# Patient Record
Sex: Male | Born: 1950 | Race: White | Hispanic: No | Marital: Married | Smoking: Former smoker
Health system: Southern US, Community
[De-identification: ages and names within clinical notes are randomized; demographics above are authoritative.]

## PROBLEM LIST (undated history)

## (undated) DIAGNOSIS — I2510 Atherosclerotic heart disease of native coronary artery without angina pectoris: Secondary | ICD-10-CM

## (undated) DIAGNOSIS — M1990 Unspecified osteoarthritis, unspecified site: Secondary | ICD-10-CM

## (undated) DIAGNOSIS — I10 Essential (primary) hypertension: Secondary | ICD-10-CM

## (undated) DIAGNOSIS — D649 Anemia, unspecified: Secondary | ICD-10-CM

## (undated) DIAGNOSIS — C4491 Basal cell carcinoma of skin, unspecified: Secondary | ICD-10-CM

## (undated) DIAGNOSIS — E785 Hyperlipidemia, unspecified: Secondary | ICD-10-CM

## (undated) DIAGNOSIS — Z9889 Other specified postprocedural states: Secondary | ICD-10-CM

## (undated) DIAGNOSIS — I4891 Unspecified atrial fibrillation: Secondary | ICD-10-CM

## (undated) DIAGNOSIS — M109 Gout, unspecified: Secondary | ICD-10-CM

## (undated) DIAGNOSIS — N189 Chronic kidney disease, unspecified: Secondary | ICD-10-CM

## (undated) DIAGNOSIS — I499 Cardiac arrhythmia, unspecified: Secondary | ICD-10-CM

## (undated) DIAGNOSIS — Z952 Presence of prosthetic heart valve: Secondary | ICD-10-CM

## (undated) DIAGNOSIS — D696 Thrombocytopenia, unspecified: Secondary | ICD-10-CM

## (undated) DIAGNOSIS — Z95810 Presence of automatic (implantable) cardiac defibrillator: Secondary | ICD-10-CM

## (undated) DIAGNOSIS — I509 Heart failure, unspecified: Secondary | ICD-10-CM

## (undated) HISTORY — DX: Heart failure, unspecified: I50.9

## (undated) HISTORY — DX: Unspecified atrial fibrillation: I48.91

## (undated) HISTORY — DX: Presence of prosthetic heart valve: Z95.2

## (undated) HISTORY — DX: Presence of automatic (implantable) cardiac defibrillator: Z95.810

## (undated) HISTORY — DX: Cardiac arrhythmia, unspecified: I49.9

## (undated) HISTORY — DX: Anemia, unspecified: D64.9

## (undated) HISTORY — DX: Atherosclerotic heart disease of native coronary artery without angina pectoris: I25.10

## (undated) HISTORY — DX: Other specified postprocedural states: Z98.890

## (undated) HISTORY — DX: Gout, unspecified: M10.9

## (undated) HISTORY — PX: SHOULDER SURGERY: SHX246

## (undated) HISTORY — DX: Essential (primary) hypertension: I10

## (undated) HISTORY — DX: Hyperlipidemia, unspecified: E78.5

## (undated) HISTORY — DX: Unspecified osteoarthritis, unspecified site: M19.90

## (undated) HISTORY — PX: KNEE SURGERY: SHX244

## (undated) HISTORY — DX: Basal cell carcinoma of skin, unspecified: C44.91

## (undated) HISTORY — DX: Chronic kidney disease, unspecified: N18.9

## (undated) HISTORY — PX: JOINT REPLACEMENT: SHX530

## (undated) HISTORY — DX: Thrombocytopenia, unspecified: D69.6

---

## 1996-06-14 DIAGNOSIS — I10 Essential (primary) hypertension: Secondary | ICD-10-CM

## 1996-06-14 HISTORY — DX: Essential (primary) hypertension: I10

## 1999-06-15 DIAGNOSIS — I499 Cardiac arrhythmia, unspecified: Secondary | ICD-10-CM

## 1999-06-15 HISTORY — DX: Cardiac arrhythmia, unspecified: I49.9

## 1999-06-21 DIAGNOSIS — I2510 Atherosclerotic heart disease of native coronary artery without angina pectoris: Secondary | ICD-10-CM

## 1999-06-21 DIAGNOSIS — I509 Heart failure, unspecified: Secondary | ICD-10-CM

## 1999-06-21 HISTORY — DX: Heart failure, unspecified: I50.9

## 1999-06-21 HISTORY — DX: Atherosclerotic heart disease of native coronary artery without angina pectoris: I25.10

## 2008-06-14 DIAGNOSIS — N189 Chronic kidney disease, unspecified: Secondary | ICD-10-CM

## 2008-06-14 HISTORY — DX: Chronic kidney disease, unspecified: N18.9

## 2009-06-14 DIAGNOSIS — C4491 Basal cell carcinoma of skin, unspecified: Secondary | ICD-10-CM

## 2009-06-14 HISTORY — DX: Basal cell carcinoma of skin, unspecified: C44.91

## 2013-11-09 DIAGNOSIS — Z95810 Presence of automatic (implantable) cardiac defibrillator: Secondary | ICD-10-CM

## 2013-11-09 HISTORY — DX: Presence of automatic (implantable) cardiac defibrillator: Z95.810

## 2015-06-15 DIAGNOSIS — D696 Thrombocytopenia, unspecified: Secondary | ICD-10-CM

## 2015-06-15 HISTORY — DX: Thrombocytopenia, unspecified: D69.6

## 2015-12-13 DIAGNOSIS — Z952 Presence of prosthetic heart valve: Secondary | ICD-10-CM

## 2015-12-13 DIAGNOSIS — Z9889 Other specified postprocedural states: Secondary | ICD-10-CM

## 2015-12-13 HISTORY — DX: Other specified postprocedural states: Z98.890

## 2015-12-13 HISTORY — DX: Presence of prosthetic heart valve: Z95.2

## 2015-12-25 HISTORY — PX: CORONARY ARTERY BYPASS GRAFT: SHX141

## 2015-12-28 DIAGNOSIS — I4891 Unspecified atrial fibrillation: Secondary | ICD-10-CM | POA: Diagnosis present

## 2015-12-28 HISTORY — DX: Unspecified atrial fibrillation: I48.91

## 2016-10-26 ENCOUNTER — Ambulatory Visit (INDEPENDENT_AMBULATORY_CARE_PROVIDER_SITE_OTHER): Payer: Medicare Other | Admitting: Family Medicine

## 2016-10-26 ENCOUNTER — Encounter (INDEPENDENT_AMBULATORY_CARE_PROVIDER_SITE_OTHER): Payer: Self-pay

## 2016-10-26 ENCOUNTER — Ambulatory Visit (HOSPITAL_COMMUNITY)
Admission: RE | Admit: 2016-10-26 | Discharge: 2016-10-26 | Disposition: A | Discharge status: Home / Self Care | Payer: Medicare Other | Source: Ambulatory Visit | Attending: Family Medicine | Admitting: Family Medicine

## 2016-10-26 ENCOUNTER — Encounter: Payer: Self-pay | Admitting: Family Medicine

## 2016-10-26 VITALS — BP 92/60 | HR 96 | Temp 97.7°F | Resp 20 | Ht 66.0 in | Wt 204.0 lb

## 2016-10-26 DIAGNOSIS — I5042 Chronic combined systolic (congestive) and diastolic (congestive) heart failure: Secondary | ICD-10-CM | POA: Diagnosis present

## 2016-10-26 DIAGNOSIS — Z96653 Presence of artificial knee joint, bilateral: Secondary | ICD-10-CM | POA: Diagnosis not present

## 2016-10-26 DIAGNOSIS — I10 Essential (primary) hypertension: Secondary | ICD-10-CM | POA: Diagnosis not present

## 2016-10-26 DIAGNOSIS — I700 Atherosclerosis of aorta: Secondary | ICD-10-CM | POA: Diagnosis not present

## 2016-10-26 DIAGNOSIS — N184 Chronic kidney disease, stage 4 (severe): Secondary | ICD-10-CM

## 2016-10-26 DIAGNOSIS — Z951 Presence of aortocoronary bypass graft: Secondary | ICD-10-CM | POA: Diagnosis not present

## 2016-10-26 DIAGNOSIS — M1990 Unspecified osteoarthritis, unspecified site: Secondary | ICD-10-CM | POA: Insufficient documentation

## 2016-10-26 DIAGNOSIS — Z1159 Encounter for screening for other viral diseases: Secondary | ICD-10-CM | POA: Diagnosis not present

## 2016-10-26 DIAGNOSIS — M19041 Primary osteoarthritis, right hand: Secondary | ICD-10-CM

## 2016-10-26 DIAGNOSIS — M19042 Primary osteoarthritis, left hand: Secondary | ICD-10-CM

## 2016-10-26 DIAGNOSIS — I2510 Atherosclerotic heart disease of native coronary artery without angina pectoris: Secondary | ICD-10-CM

## 2016-10-26 DIAGNOSIS — Z952 Presence of prosthetic heart valve: Secondary | ICD-10-CM

## 2016-10-26 DIAGNOSIS — E785 Hyperlipidemia, unspecified: Secondary | ICD-10-CM | POA: Diagnosis not present

## 2016-10-26 DIAGNOSIS — C44612 Basal cell carcinoma of skin of right upper limb, including shoulder: Secondary | ICD-10-CM

## 2016-10-26 NOTE — Progress Notes (Signed)
lmtcb

## 2016-10-26 NOTE — Progress Notes (Signed)
Chief Complaint  Patient presents with  . Establish Care   New to establish He has multiple significant medical problems, and has spent the last few years living traveling and living in a motor home with no PCP.  He did establish with an MD in [place] while there last year when he had a MI and CABG urgently with aortic valve replacement.  That doctor has filled his medicine until now.  He weighs himself daily, takes all medicine prescribed, follows low salt diet, adjust diuretics accordingly managing his CHF.  Says his EF is around 20%.  Lately has had more SOB, fatigue, difficulty sleeping flat, and pressure in chest.  He needs cardiology assessment urgently and is advised - referral placed. BP controlled Lipids managed, no recent labs He says he is in stage 4 kidney failure He brings in his medicine for review  Had a flu shot and pneumonia shot in the [place] last year Insurance will not pay for shingles shot because he had shingles Colonoscopy over 10 years   Patient Active Problem List   Diagnosis Date Noted  . History of total bilateral knee replacement (TKR) 10/26/2016  . 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 CAD (coronary artery disease) 10/26/2016  . HTN (hypertension) 10/26/2016  . Chronic kidney failure 10/26/2016  . HLD (hyperlipidemia) 10/26/2016  . Chronic combined systolic and diastolic CHF (congestive heart failure) ([place]) 10/26/2016  . Osteoarthritis 10/26/2016    Outpatient Encounter Prescriptions as of 10/26/2016  Medication Sig  . allopurinol (ZYLOPRIM) 300 MG tablet Take 300 mg by mouth daily.  Marland Kitchen aspirin EC 81 MG tablet Take 81 mg by mouth daily.  Marland Kitchen atorvastatin (LIPITOR) 40 MG tablet Take 40 mg by mouth daily at 6 PM.  . bumetanide (BUMEX) 1 MG tablet Take 1 mg by mouth 2 (two) times daily.  . carvedilol (COREG) 12.5 MG tablet Take 12.5 mg by mouth 2 (two) times daily with a meal.  . cholecalciferol (VITAMIN D) 1000 units tablet Take 2,000 Units by mouth daily.  .  ferrous sulfate (IRON SUPPLEMENT) 325 (65 FE) MG tablet Take 325 mg by mouth daily with breakfast.  . Multiple Vitamin (MULTIVITAMIN) capsule Take 1 capsule by mouth daily.  . nitroGLYCERIN (NITROSTAT) 0.4 MG SL tablet Place 0.4 mg under the tongue every 5 (five) minutes as needed for chest pain.  . Probiotic Product (SOLUBLE FIBER/PROBIOTICS PO) Take by mouth.  . sacubitril-valsartan (ENTRESTO) 24-26 MG Take 1 tablet by mouth 2 (two) times daily.  Marland Kitchen spironolactone (ALDACTONE) 25 MG tablet Take 25 mg by mouth daily.  . vitamin C (ASCORBIC ACID) 500 MG tablet Take 500 mg by mouth daily.   No facility-administered encounter medications on file as of 10/26/2016.     Past Medical History:  Diagnosis Date  . Allergy    penicillin  . Arthritis    arthritis  . Blood transfusion without reported diagnosis    open heart  . Cancer ([place])    skin cancer  . CHF (congestive heart failure) ([place])   . Chronic kidney disease    stage 4 kidney failure  . Heart murmur   . Hyperlipidemia   . Hypertension   . Myocardial infarction [facility] [place])     Past Surgical History:  Procedure Laterality Date  . CORONARY ARTERY BYPASS GRAFT  12/2015  . JOINT REPLACEMENT     both knees replaced  . KNEE SURGERY     bilateral  . SHOULDER SURGERY     left  Social History   Social History  . Marital status: Married    Spouse name: [name]  . Number of children: 0  . Years of education: 14   Occupational History  . 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 Smoking status: Former Smoker    Packs/day: 0.50    Types: Cigarettes    Start date: 06/14/1970    Quit date: 06/14/2000  . Smokeless tobacco: Never Used  . Alcohol use Yes     Comment: occas social drink  . Drug use: No  . Sexual activity: Yes    Birth control/ protection: Surgical   Other Topics Concern  . 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 Stroke Mother   . Heart disease Father 68  . Multiple myeloma Brother     Review of Systems  Constitutional: Positive for malaise/fatigue. Negative for chills, fever and weight loss.  HENT: Negative for congestion and hearing loss.   Eyes: Negative for blurred vision and pain.  Respiratory: Positive for cough, sputum production and shortness of breath.   Cardiovascular: Positive for chest pain, orthopnea, leg swelling and PND.  Gastrointestinal: Negative for abdominal pain, constipation, diarrhea and heartburn.  Genitourinary: Negative for dysuria and frequency.  Musculoskeletal: Negative for falls, joint pain and myalgias.  Neurological: Negative for dizziness, seizures and headaches.  Psychiatric/Behavioral: Negative for depression. The patient is not nervous/anxious and does not have insomnia.     BP 92/60 (BP Location: Right Arm, Patient Position: Sitting, Cuff Size: Normal)   Pulse 96   Temp 97.7 F (36.5 C) (Temporal)   Resp 20   Ht _0  (1.676 m)   Wt 204 lb (92.5 kg)   SpO2 95%   BMI 32.93 kg/m   Physical Exam  Constitutional: He is oriented to person, place, and time. He appears well-developed and well-nourished.  HENT:  Head: Normocephalic and atraumatic.  Right Ear: External ear normal.  Left Ear: External ear normal.  Mouth/Throat: Oropharynx is clear and moist.  Eyes: Conjunctivae are normal. Pupils are equal, round, and reactive to light.  Neck: Normal range of motion. Neck supple. No thyromegaly present.  Cardiovascular: Normal rate and regular rhythm.   Systolic murmur  Pulmonary/Chest: Effort normal. No respiratory distress.  Rales bases  Abdominal: Soft. Bowel sounds are normal.  Musculoskeletal: Normal range of motion. He exhibits no edema.  Lymphadenopathy:    He has no cervical adenopathy.  Neurological: He is alert and oriented to  person, place, and time.  Gait normal  Skin: Skin is warm and dry.  Lesion on R forearm  Psychiatric: He has a normal mood and affect. His behavior is normal. Thought content normal.  Nursing note and vitals reviewed.   1. History of total bilateral knee replacement (TKR)   2. Hx of CABG  - CBC - COMPLETE METABOLIC PANEL WITH GFR - Lipid panel - Urinalysis, Routine w reflex microscopic - Ambulatory referral to Cardiology  3. Coronary artery disease involving native coronary artery of native heart without angina pectoris  - CBC - COMPLETE METABOLIC PANEL WITH GFR - Lipid panel - Urinalysis, Routine w reflex microscopic - Ambulatory referral to Cardiology  4. Essential hypertension  5. Chronic renal failure, stage 4 (severe) ([place])   6. Hyperlipidemia, unspecified hyperlipidemia type   7. Chronic combined systolic and diastolic CHF (congestive heart failure) ([place])  - DG Chest 2 View; Future  8. Primary osteoarthritis of both hands   9. Need for hepatitis C screening test  - Hepatitis C antibody  10. BCC (basal cell carcinoma), arm, right  - Ambulatory referral to Dermatology Greater than 50% of this visit was spent in counseling and coordinating care.  Total face to face time:  60 min managing CHF worsening and new patient info   Patient Instructions  Need labs today Need chest x ray today Need to see a cardiologist, referral placed (prefer next week) Need to see dermatology - any time  I will let you know your results Make sure we have your number Need old records from [place] See me in a month       [name], MD

## 2016-10-26 NOTE — Patient Instructions (Signed)
Need labs today Need chest x ray today Need to see a cardiologist, referral placed (prefer next week) Need to see dermatology - any time  I will let you know your results Make sure we have your number Need old records from [place] See me in a month

## 2016-10-27 ENCOUNTER — Encounter: Payer: Self-pay | Admitting: Family Medicine

## 2016-10-27 LAB — COMPLETE METABOLIC PANEL WITH GFR
ALT: 25 U/L (ref 9–46)
AST: 20 U/L (ref 10–35)
Albumin: 3.8 g/dL (ref 3.6–5.1)
Alkaline Phosphatase: 71 U/L (ref 40–115)
BILIRUBIN TOTAL: 0.6 mg/dL (ref 0.2–1.2)
BUN: 56 mg/dL — ABNORMAL HIGH (ref 7–25)
CHLORIDE: 103 mmol/L (ref 98–110)
CO2: 25 mmol/L (ref 20–31)
Calcium: 9.3 mg/dL (ref 8.6–10.3)
Creat: 2.38 mg/dL — ABNORMAL HIGH (ref 0.70–1.25)
GFR, EST AFRICAN AMERICAN: 31 mL/min — AB (ref >=60)
GFR, EST NON AFRICAN AMERICAN: 27 mL/min — AB (ref >=60)
Glucose, Bld: 88 mg/dL (ref 65–99)
Potassium: 4.7 mmol/L (ref 3.5–5.3)
Sodium: 139 mmol/L (ref 135–146)
Total Protein: 6.6 g/dL (ref 6.1–8.1)

## 2016-10-27 LAB — LIPID PANEL
Cholesterol: 106 mg/dL (ref <200)
HDL: 44 mg/dL (ref >40)
LDL CALC: 40 mg/dL (ref <100)
Total CHOL/HDL Ratio: 2.4 Ratio (ref <5.0)
Triglycerides: 110 mg/dL (ref <150)
VLDL: 22 mg/dL (ref <30)

## 2016-10-27 LAB — URINALYSIS, ROUTINE W REFLEX MICROSCOPIC
Bilirubin Urine: NEGATIVE
GLUCOSE, UA: NEGATIVE
Hgb urine dipstick: NEGATIVE
Ketones, ur: NEGATIVE
Leukocytes, UA: NEGATIVE
Nitrite: NEGATIVE
PROTEIN: NEGATIVE
Specific Gravity, Urine: 1.014 (ref 1.001–1.035)
pH: 5.5 (ref 5.0–8.0)

## 2016-10-27 LAB — CBC
HCT: 36.3 % — ABNORMAL LOW (ref 38.5–50.0)
Hemoglobin: 11.7 g/dL — ABNORMAL LOW (ref 13.2–17.1)
MCH: 32.9 pg (ref 27.0–33.0)
MCHC: 32.2 g/dL (ref 32.0–36.0)
MCV: 102 fL — AB (ref 80.0–100.0)
MPV: 11.6 fL (ref 7.5–12.5)
PLATELETS: 158 10*3/uL (ref 140–400)
RBC: 3.56 MIL/uL — ABNORMAL LOW (ref 4.20–5.80)
RDW: 15.7 % — ABNORMAL HIGH (ref 11.0–15.0)
WBC: 7.7 10*3/uL (ref 3.8–10.8)

## 2016-10-27 LAB — HEPATITIS C ANTIBODY: HCV Ab: NEGATIVE

## 2016-10-28 NOTE — Progress Notes (Signed)
[facility], aware.

## 2016-11-16 ENCOUNTER — Telehealth: Payer: Self-pay | Admitting: Family Medicine

## 2016-11-16 MED ORDER — ALLOPURINOL 300 MG PO TABS: 300.0000 mg | ORAL_TABLET | Freq: Every day | ORAL | 3 refills | Status: AC

## 2016-11-16 MED ORDER — ATORVASTATIN CALCIUM 40 MG PO TABS: 40.0000 mg | ORAL_TABLET | Freq: Every day | ORAL | 3 refills | Status: AC

## 2016-11-16 NOTE — Telephone Encounter (Signed)
Patient is having trouble getting his [facility]. I could not make out what they were per the message he left.  He has about 3. Cb#: [phone]

## 2016-11-16 NOTE — Telephone Encounter (Signed)
rxs done, [facility] per pt.

## 2016-11-18 ENCOUNTER — Encounter: Payer: Self-pay | Admitting: Family Medicine

## 2016-11-18 DIAGNOSIS — E559 Vitamin D deficiency, unspecified: Secondary | ICD-10-CM | POA: Insufficient documentation

## 2016-11-18 DIAGNOSIS — Z95810 Presence of automatic (implantable) cardiac defibrillator: Secondary | ICD-10-CM | POA: Insufficient documentation

## 2016-11-19 ENCOUNTER — Emergency Department (HOSPITAL_COMMUNITY): Payer: Medicare Other

## 2016-11-19 ENCOUNTER — Encounter (HOSPITAL_COMMUNITY): Payer: Self-pay | Admitting: Emergency Medicine

## 2016-11-19 ENCOUNTER — Inpatient Hospital Stay (HOSPITAL_COMMUNITY)
Admission: EM | Admit: 2016-11-19 | Discharge: 2016-11-21 | DRG: 291 | Disposition: A | Discharge status: Home / Self Care | Payer: Medicare Other | Attending: Internal Medicine | Admitting: Internal Medicine

## 2016-11-19 DIAGNOSIS — I5023 Acute on chronic systolic (congestive) heart failure: Secondary | ICD-10-CM | POA: Diagnosis present

## 2016-11-19 DIAGNOSIS — Z952 Presence of prosthetic heart valve: Secondary | ICD-10-CM

## 2016-11-19 DIAGNOSIS — I130 Hypertensive heart and chronic kidney disease with heart failure and stage 1 through stage 4 chronic kidney disease, or unspecified chronic kidney disease: Secondary | ICD-10-CM | POA: Diagnosis not present

## 2016-11-19 DIAGNOSIS — Z807 Family history of other malignant neoplasms of lymphoid, hematopoietic and related tissues: Secondary | ICD-10-CM

## 2016-11-19 DIAGNOSIS — I509 Heart failure, unspecified: Secondary | ICD-10-CM

## 2016-11-19 DIAGNOSIS — I420 Dilated cardiomyopathy: Secondary | ICD-10-CM | POA: Diagnosis present

## 2016-11-19 DIAGNOSIS — I959 Hypotension, unspecified: Secondary | ICD-10-CM | POA: Diagnosis present

## 2016-11-19 DIAGNOSIS — R42 Dizziness and giddiness: Secondary | ICD-10-CM | POA: Diagnosis not present

## 2016-11-19 DIAGNOSIS — Z8249 Family history of ischemic heart disease and other diseases of the circulatory system: Secondary | ICD-10-CM

## 2016-11-19 DIAGNOSIS — I493 Ventricular premature depolarization: Secondary | ICD-10-CM | POA: Diagnosis present

## 2016-11-19 DIAGNOSIS — I9589 Other hypotension: Secondary | ICD-10-CM

## 2016-11-19 DIAGNOSIS — E785 Hyperlipidemia, unspecified: Secondary | ICD-10-CM | POA: Diagnosis present

## 2016-11-19 DIAGNOSIS — I252 Old myocardial infarction: Secondary | ICD-10-CM

## 2016-11-19 DIAGNOSIS — Z951 Presence of aortocoronary bypass graft: Secondary | ICD-10-CM

## 2016-11-19 DIAGNOSIS — N184 Chronic kidney disease, stage 4 (severe): Secondary | ICD-10-CM | POA: Diagnosis present

## 2016-11-19 DIAGNOSIS — I5021 Acute systolic (congestive) heart failure: Secondary | ICD-10-CM | POA: Diagnosis not present

## 2016-11-19 DIAGNOSIS — Z85828 Personal history of other malignant neoplasm of skin: Secondary | ICD-10-CM

## 2016-11-19 DIAGNOSIS — I2510 Atherosclerotic heart disease of native coronary artery without angina pectoris: Secondary | ICD-10-CM | POA: Diagnosis present

## 2016-11-19 DIAGNOSIS — Z880 Allergy status to penicillin: Secondary | ICD-10-CM

## 2016-11-19 DIAGNOSIS — Z95810 Presence of automatic (implantable) cardiac defibrillator: Secondary | ICD-10-CM | POA: Diagnosis present

## 2016-11-19 DIAGNOSIS — Z7982 Long term (current) use of aspirin: Secondary | ICD-10-CM

## 2016-11-19 DIAGNOSIS — Z953 Presence of xenogenic heart valve: Secondary | ICD-10-CM

## 2016-11-19 DIAGNOSIS — I5043 Acute on chronic combined systolic (congestive) and diastolic (congestive) heart failure: Secondary | ICD-10-CM | POA: Diagnosis present

## 2016-11-19 DIAGNOSIS — Z79899 Other long term (current) drug therapy: Secondary | ICD-10-CM

## 2016-11-19 DIAGNOSIS — Z87891 Personal history of nicotine dependence: Secondary | ICD-10-CM

## 2016-11-19 LAB — CBC WITH DIFFERENTIAL/PLATELET
BASOS ABS: 0 10*3/uL (ref 0.0–0.1)
BASOS PCT: 0 %
EOS ABS: 0.3 10*3/uL (ref 0.0–0.7)
EOS PCT: 4 %
HCT: 38.7 % — ABNORMAL LOW (ref 39.0–52.0)
Hemoglobin: 12.4 g/dL — ABNORMAL LOW (ref 13.0–17.0)
Lymphocytes Relative: 13 %
Lymphs Abs: 0.9 10*3/uL (ref 0.7–4.0)
MCH: 32.8 pg (ref 26.0–34.0)
MCHC: 32 g/dL (ref 30.0–36.0)
MCV: 102.4 fL — AB (ref 78.0–100.0)
MONO ABS: 0.5 10*3/uL (ref 0.1–1.0)
Monocytes Relative: 8 %
Neutro Abs: 5.1 10*3/uL (ref 1.7–7.7)
Neutrophils Relative %: 75 %
PLATELETS: 137 10*3/uL — AB (ref 150–400)
RBC: 3.78 MIL/uL — ABNORMAL LOW (ref 4.22–5.81)
RDW: 16.5 % — AB (ref 11.5–15.5)
WBC: 6.7 10*3/uL (ref 4.0–10.5)

## 2016-11-19 LAB — TROPONIN I
TROPONIN I: 0.04 ng/mL — AB (ref <0.03)
Troponin I: 0.04 ng/mL (ref <0.03)

## 2016-11-19 LAB — BASIC METABOLIC PANEL
ANION GAP: 10 (ref 5–15)
BUN: 83 mg/dL — ABNORMAL HIGH (ref 6–20)
CALCIUM: 9.3 mg/dL (ref 8.9–10.3)
CO2: 23 mmol/L (ref 22–32)
Chloride: 104 mmol/L (ref 101–111)
Creatinine, Ser: 2.25 mg/dL — ABNORMAL HIGH (ref 0.61–1.24)
GFR, EST AFRICAN AMERICAN: 33 mL/min — AB (ref >60)
GFR, EST NON AFRICAN AMERICAN: 28 mL/min — AB (ref >60)
GLUCOSE: 112 mg/dL — AB (ref 65–99)
Potassium: 4.8 mmol/L (ref 3.5–5.1)
SODIUM: 137 mmol/L (ref 135–145)

## 2016-11-19 LAB — BRAIN NATRIURETIC PEPTIDE: B Natriuretic Peptide: 1536 pg/mL — ABNORMAL HIGH (ref 0.0–100.0)

## 2016-11-19 MED ORDER — ACETAMINOPHEN 325 MG PO TABS: 650.0000 mg | ORAL_TABLET | ORAL | Status: DC | PRN

## 2016-11-19 MED ORDER — ASPIRIN EC 81 MG PO TBEC
81.0000 mg | DELAYED_RELEASE_TABLET | Freq: Every day | ORAL | Status: DC
  Administered 2016-11-19 – 2016-11-20 (×2): 81 mg via ORAL
  Filled 2016-11-19 (×3): qty 1

## 2016-11-19 MED ORDER — ONDANSETRON HCL 4 MG/2ML IJ SOLN: 4.0000 mg | Freq: Four times a day (QID) | INTRAMUSCULAR | Status: DC | PRN

## 2016-11-19 MED ORDER — FUROSEMIDE 10 MG/ML IJ SOLN
40.0000 mg | Freq: Two times a day (BID) | INTRAMUSCULAR | Status: DC
  Administered 2016-11-19 – 2016-11-20 (×3): 40 mg via INTRAVENOUS
  Filled 2016-11-19 (×4): qty 4

## 2016-11-19 MED ORDER — SPIRONOLACTONE 25 MG PO TABS
25.0000 mg | ORAL_TABLET | Freq: Every day | ORAL | Status: DC
  Administered 2016-11-20: 25 mg via ORAL
  Filled 2016-11-19 (×2): qty 1

## 2016-11-19 MED ORDER — FUROSEMIDE 10 MG/ML IJ SOLN
40.0000 mg | INTRAMUSCULAR | Status: AC
  Administered 2016-11-19: 40 mg via INTRAVENOUS
  Filled 2016-11-19: qty 4

## 2016-11-19 MED ORDER — ASPIRIN 81 MG PO CHEW
324.0000 mg | CHEWABLE_TABLET | Freq: Once | ORAL | Status: AC
  Administered 2016-11-19: 324 mg via ORAL
  Filled 2016-11-19 (×2): qty 4

## 2016-11-19 MED ORDER — POTASSIUM CHLORIDE CRYS ER 20 MEQ PO TBCR
40.0000 meq | EXTENDED_RELEASE_TABLET | Freq: Two times a day (BID) | ORAL | Status: DC
  Administered 2016-11-19 – 2016-11-20 (×3): 40 meq via ORAL
  Filled 2016-11-19 (×5): qty 2

## 2016-11-19 MED ORDER — SODIUM CHLORIDE 0.9% FLUSH: 3.0000 mL | INTRAVENOUS | Status: DC | PRN

## 2016-11-19 MED ORDER — ATORVASTATIN CALCIUM 40 MG PO TABS
40.0000 mg | ORAL_TABLET | Freq: Every day | ORAL | Status: DC
  Administered 2016-11-19 – 2016-11-20 (×2): 40 mg via ORAL
  Filled 2016-11-19: qty 1

## 2016-11-19 MED ORDER — ENOXAPARIN SODIUM 30 MG/0.3ML ~~LOC~~ SOLN: 30.0000 mg | SUBCUTANEOUS | Status: DC

## 2016-11-19 MED ORDER — SODIUM CHLORIDE 0.9 % IV SOLN: 250.0000 mL | INTRAVENOUS | Status: DC | PRN

## 2016-11-19 MED ORDER — SODIUM CHLORIDE 0.9 % IV SOLN
INTRAVENOUS | Status: DC
  Administered 2016-11-19: 10:00:00 via INTRAVENOUS

## 2016-11-19 MED ORDER — ENOXAPARIN SODIUM 40 MG/0.4ML ~~LOC~~ SOLN
40.0000 mg | SUBCUTANEOUS | Status: DC
  Administered 2016-11-19: 40 mg via SUBCUTANEOUS
  Filled 2016-11-19 (×2): qty 0.4

## 2016-11-19 MED ORDER — CARVEDILOL 3.125 MG PO TABS
3.1250 mg | ORAL_TABLET | Freq: Two times a day (BID) | ORAL | Status: DC
  Administered 2016-11-19 – 2016-11-20 (×3): 3.125 mg via ORAL
  Filled 2016-11-19 (×4): qty 1

## 2016-11-19 MED ORDER — ALLOPURINOL 300 MG PO TABS
300.0000 mg | ORAL_TABLET | Freq: Every day | ORAL | Status: DC
  Administered 2016-11-20: 300 mg via ORAL
  Filled 2016-11-19 (×2): qty 1

## 2016-11-19 MED ORDER — SODIUM CHLORIDE 0.9% FLUSH
3.0000 mL | Freq: Two times a day (BID) | INTRAVENOUS | Status: DC
  Administered 2016-11-19 – 2016-11-20 (×3): 3 mL via INTRAVENOUS

## 2016-11-19 NOTE — ED Notes (Signed)
Report given to Latoya on 300.

## 2016-11-19 NOTE — ED Notes (Signed)
Pt given snack per MD Millers approval.

## 2016-11-19 NOTE — ED Notes (Signed)
Noted pt having significant runs of PVCs.  Up to 4 in a row.  Strip printed and primary RN and MD made aware.  Pt denies symptoms with this.

## 2016-11-19 NOTE — ED Notes (Addendum)
[facility] states that medical device has no complications.  Rep reports VT on March 7.

## 2016-11-19 NOTE — ED Notes (Signed)
MD Sabra Heck given EKG strips as pt is having multiple runs of PVCs. Pt denies CP.

## 2016-11-19 NOTE — ED Notes (Signed)
ED Provider at bedside. 

## 2016-11-19 NOTE — H&P (Signed)
History and Physical  Dillon [place] WUJ:[id number] DOB: [birth year] DOA: 11/19/2016  Referring physician: Dr Sabra Heck, ED physician PCP: [name], MD  Outpatient Specialists: none  Patient Coming From: Home  Chief Complaint: Leg swelling, weakness  HPI: [place] is a 66 y.o. male with a history of coronary artery disease status post bypass in July 2017 with aortic valve replacement and mitral valve repair, hypertension, chronic kidney disease stage IV, hyperlipidemia, history of MI, combined chronic heart failure with EF of approximately 25% per patient report in September of last year. Patient and his wife has been fairly nomadic over the past 8 years and have had variety of different cardiologists. They recently settle down to the [place] area and bottom house with plans to stay here. He has an appointment with Dr. Harl Bowie at the beginning of the week. He has had no problems with his surgery. This morning he woke up and felt lightheaded particularly with standing. He noted his heart rate was in the 40s on his home blood pressure cuff and noted that his blood pressure was low. He came to the hospital for evaluation. He denies chest pain, shortness of breath, orthopnea, dyspnea on exertion. Does have a slightly productive cough for the past several days. No fevers, chills.  Emergency Department Course: Workup reveals a stable cyst minimally elevated troponin. He does have a BNP of 1500. The patient was given 40 mg of IV Lasix and diuresed over 2 L.  Review of Systems:   Pt denies any fevers, chills, nausea, vomiting, diarrhea, constipation, abdominal pain, shortness of breath, dyspnea on exertion, orthopnea, cough, wheezing, palpitations, headache, vision changes, lightheadedness, dizziness, melena, rectal bleeding.  Review of systems are otherwise negative  Past Medical History:  Diagnosis Date  . Allergy    penicillin  . Arthritis    arthritis  . Blood transfusion without  reported diagnosis    open heart  . Cancer ([place])    skin cancer  . CHF (congestive heart failure) ([place])   . Chronic kidney disease    stage 4 kidney failure  . Heart murmur   . Hyperlipidemia   . Hypertension   . Myocardial infarction [facility])    Past Surgical History:  Procedure Laterality Date  . CORONARY ARTERY BYPASS GRAFT  12/2015  . JOINT REPLACEMENT     both knees replaced  . KNEE SURGERY     bilateral  . SHOULDER SURGERY     left   Social History:  reports that he quit smoking about 16 years ago. His smoking use included Cigarettes. He started smoking about 46 years ago. He smoked 0.50 packs per day. He has never used smokeless tobacco. He reports that he drinks alcohol. He reports that he does not use drugs. Patient lives at [facility]  . Penicillins Hives    Has patient had a PCN reaction causing immediate rash, facial/tongue/throat swelling, SOB or lightheadedness with hypotension: No Has patient had a PCN reaction causing severe rash involving mucus membranes or skin necrosis: No Has patient had a PCN reaction that required hospitalization: No Has patient had a PCN reaction occurring within the last 10 years: No If all of the above answers are "NO", then may proceed with Cephalosporin use.     Family History  Problem Relation Age of Onset  . Stroke Mother   . Heart disease Father 1  . Multiple myeloma Brother      Prior to Admission medications   Medication Sig  Start Date End Date Taking? Authorizing Provider  allopurinol (ZYLOPRIM) 300 MG tablet Take 1 tablet (300 mg total) by mouth daily. 11/16/16  Yes [name], MD  aspirin EC 81 MG tablet Take 81 mg by mouth daily.   Yes [provider]  atorvastatin (LIPITOR) 40 MG tablet Take 1 tablet (40 mg total) by mouth daily at 6 PM. 11/16/16  Yes [name], MD  bumetanide (BUMEX) 1 MG tablet Take 1 mg by mouth 2 (two) times daily.   Yes [provider]    carvedilol (COREG) 12.5 MG tablet Take 12.5 mg by mouth 2 (two) times daily with a meal.   Yes [provider]  cholecalciferol (VITAMIN D) 1000 units tablet Take 2,000 Units by mouth daily.   Yes [provider]  ferrous sulfate (IRON SUPPLEMENT) 325 (65 FE) MG tablet Take 325 mg by mouth daily with breakfast.   Yes [provider]  Multiple Vitamin (MULTIVITAMIN) capsule Take 1 capsule by mouth daily.   Yes [provider]  nitroGLYCERIN (NITROSTAT) 0.4 MG SL tablet Place 0.4 mg under the tongue every 5 (five) minutes as needed for chest pain.   Yes [provider]  Probiotic Product (SOLUBLE FIBER/PROBIOTICS PO) Take by mouth.   Yes [provider]  sacubitril-valsartan (ENTRESTO) 24-26 MG Take 1 tablet by mouth 2 (two) times daily.   Yes [provider]  spironolactone (ALDACTONE) 25 MG tablet Take 25 mg by mouth daily.   Yes [provider]  vitamin C (ASCORBIC ACID) 500 MG tablet Take 500 mg by mouth daily.   Yes [provider]    Physical Exam: BP (!) 113/55   Pulse 95   Temp 97.7 F (36.5 C) (Oral)   Resp 16   Ht 5' 7"  (1.702 m)   Wt 90.7 kg (200 lb)   SpO2 93%   BMI 31.32 kg/m   General: Older Caucasian male. Awake and alert and oriented x3. No acute cardiopulmonary distress.  HEENT: Normocephalic atraumatic.  Right and left ears normal in appearance.  Pupils equal, round, reactive to light. Extraocular muscles are intact. Sclerae anicteric and noninjected.  Moist mucosal membranes. No mucosal lesions.  Neck: Neck supple without lymphadenopathy. No carotid bruits. No masses palpated.  Cardiovascular: Regular rate with normal S1-S2 sounds. No murmurs, rubs, gallops auscultated. Increased JVD. 2+ pitting edema in lower shoulder raise bilaterally to mid thigh. Respiratory: Good respiratory effort with no wheezes or rhonchi. Mild rales in bases bilaterally.  No accessory muscle use. Abdomen: Soft,  nontender, nondistended. Active bowel sounds. No masses or hepatosplenomegaly  Skin: No rashes, lesions, or ulcerations.  Dry, warm to touch. 2+ dorsalis pedis and radial pulses. Musculoskeletal: No calf or leg pain. All major joints not erythematous nontender.  No upper or lower joint deformation.  Good ROM.  No contractures  Psychiatric: Intact judgment and insight. Pleasant and cooperative. Neurologic: No focal neurological deficits. Strength is 5/5 and symmetric in upper and lower extremities.  Cranial nerves II through XII are grossly intact.           Labs on Admission: I have personally reviewed following labs and imaging studies  CBC:  Recent Labs Lab 11/19/16 0916  WBC 6.7  NEUTROABS 5.1  HGB 12.4*  HCT 38.7*  MCV 102.4*  PLT 277*   Basic Metabolic Panel:  Recent Labs Lab 11/19/16 0916  NA 137  K 4.8  CL 104  CO2 23  GLUCOSE 112*  BUN 83*  CREATININE 2.25*  CALCIUM 9.3   GFR: Estimated Creatinine Clearance: 33.7 mL/min (A) (by C-G formula based on SCr of 2.25 mg/dL (H)). Liver Function Tests: No results for input(s): AST, ALT, ALKPHOS, BILITOT, PROT, ALBUMIN in the last 168 hours. No results for input(s): LIPASE, AMYLASE in the last 168 hours. No results for input(s): AMMONIA in the last 168 hours. Coagulation Profile: No results for input(s): INR, PROTIME in the last 168 hours. Cardiac Enzymes:  Recent Labs Lab 11/19/16 0916 11/19/16 1311  TROPONINI 0.04* 0.04*   BNP (last 3 results) No results for input(s): PROBNP in the last 8760 hours. HbA1C: No results for input(s): HGBA1C in the last 72 hours. CBG: No results for input(s): GLUCAP in the last 168 hours. Lipid Profile: No results for input(s): CHOL, HDL, LDLCALC, TRIG, CHOLHDL, LDLDIRECT in the last 72 hours. Thyroid Function Tests: No results for input(s): TSH, T4TOTAL, FREET4, T3FREE, THYROIDAB in the last 72 hours. Anemia Panel: No results for input(s): VITAMINB12, FOLATE, FERRITIN,  TIBC, IRON, RETICCTPCT in the last 72 hours. Urine analysis:    Component Value Date/Time   COLORURINE YELLOW 10/26/2016 1430   APPEARANCEUR CLEAR 10/26/2016 1430   LABSPEC 1.014 10/26/2016 1430   PHURINE 5.5 10/26/2016 1430   GLUCOSEU NEGATIVE 10/26/2016 1430   HGBUR NEGATIVE 10/26/2016 1430   BILIRUBINUR NEGATIVE 10/26/2016 1430   KETONESUR NEGATIVE 10/26/2016 1430   PROTEINUR NEGATIVE 10/26/2016 1430   NITRITE NEGATIVE 10/26/2016 1430   LEUKOCYTESUR NEGATIVE 10/26/2016 1430   Sepsis Labs: @LABRCNTIP (procalcitonin:4,lacticidven:4) )No results found for this or any previous visit (from the past 240 hour(s)).   Radiological Exams on Admission: Dg Chest 2 View  Result Date: 11/19/2016 CLINICAL DATA:  Dizziness. EXAM: CHEST  2 VIEW COMPARISON:  10/26/2016 FINDINGS: Chronic cardiomegaly. AICD in place. Overall pulmonary vascularity is normal. Slight chronic accentuation of the interstitial markings. No effusions.  No acute bone abnormality. IMPRESSION: No acute abnormalities.  Chronic cardiomegaly. Electronically Signed   By: Lorriane Shire M.D.   On: 11/19/2016 10:24    EKG: Independently reviewed. Ventricularly paced rhythm with PVCs.  Assessment/Plan: Principal Problem:   Acute systolic CHF (congestive heart failure) (HCC) Active Problems:   Hx of CABG   Stage 4 chronic kidney disease (HCC)   H/O aortic valve replacement   ICD (implantable cardioverter-defibrillator) in place   Hypotension    This patient was discussed with the ED physician, including pertinent vitals, physical exam findings, labs, and imaging.  We also discussed care given by the ED provider.  #1 acute systolic heart failure  Telemetry monitoring  Strict I/O  Daily Weights  Diuresis: Lasix 40 mg IV twice a day - will give second dose tonight  Potassium: 40 mEq twice a day by mouth  Echo cardiac exam tomorrow  Repeat BMP tomorrow  Hold ARB due to hypotension  Continue Coreg, but at low dose  (3.125) due to low-dose ejection fraction #2 hypotension  Hold antihypertensives #3 coronary artery disease with history of CABG  Continue Lipitor #4 history of aortic valve replacement  Stable at this point #5 AICD/pacer  Interfocal PVCs. With paced rhythm  Telemetry monitoring  DVT prophylaxis: Lovenox Consultants: None Code Status: Full code Family Communication: Wife in the room  Disposition Plan: Observation   Zariel Capano [place] [facility] Pager [phone]  If 7PM-7AM, please contact night-coverage [email or web address] Password [email or web address]

## 2016-11-19 NOTE — ED Notes (Signed)
Date and time results received: 11/19/16 10:24 AM  (use smartphrase ".now" to insert current time)  Test: Troponin Critical Value: 0.04  Name of Provider Notified: Dr. Sabra Heck  Orders Received? Or Actions Taken?: Orders Received - See Orders for details

## 2016-11-19 NOTE — ED Triage Notes (Signed)
Pt woke up dizzy, c/o bp would not read due to his bp low. Felt hr and was in 26s. Pt a/o. Non diaphoretic. Denies pain. Pt bigeminy, monitor showing 49 hr. ble swelling x 1 week. Wife states pt has been working more the past week.

## 2016-11-19 NOTE — ED Provider Notes (Signed)
[name] DEPT Provider Note   CSN: [id number] Arrival date & time: 11/19/16  0909     History   Chief Complaint Chief Complaint  Patient presents with  . Hypotension  . Dizziness    HPI Dillon Cisneros is a 66 y.o. male.  HPI  The pt is a 66 y/o male with hx of Htn, Hld, MI, CHF and hx of stage 4 CKD - he had open heart CABG and Aortic valve repair one year ago - he has a pacer as well.  He has been in his usual state of health until this morning at 7:30 AM when he woke up and developed a light headed feeling especially with standing - this feeling is worse with standing, better with rest and sitting - took his BP and his pulse and found the pulse to be in the 40's on his home BP cuff / pulse oximeter prompting his concern.  He has no CP, no SOB, he does have swelling of the legs which is worse than usual.  He has also had increased productive cough which has been worse than usual.  Sx are mild at this time.  Past Medical History:  Diagnosis Date  . Allergy    penicillin  . Arthritis    arthritis  . Blood transfusion without reported diagnosis    open heart  . Cancer ([place])    skin cancer  . CHF (congestive heart failure) ([place])   . Chronic kidney disease    stage 4 kidney failure  . Heart murmur   . Hyperlipidemia   . Hypertension   . Myocardial infarction [facility])     Patient Active Problem List   Diagnosis Date Noted  . CHF (congestive heart failure) ([place]) 11/19/2016  . 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 Vitamin D deficiency 11/18/2016  . History of total bilateral knee replacement (TKR) 10/26/2016  . 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 CAD (coronary artery disease) 10/26/2016  . HTN (hypertension) 10/26/2016  . Chronic kidney failure 10/26/2016  . HLD (hyperlipidemia) 10/26/2016  . Chronic combined systolic and diastolic CHF (congestive heart failure) ([place]) 10/26/2016  . Osteoarthritis 10/26/2016  . H/O aortic valve replacement 10/26/2016      Past Surgical History:  Procedure Laterality Date  . CORONARY ARTERY BYPASS GRAFT  12/2015  . JOINT REPLACEMENT     both knees replaced  . KNEE SURGERY     bilateral  . SHOULDER SURGERY     left       Home Medications    Prior to Admission medications   Medication Sig Start Date End Date Taking? Authorizing Provider  allopurinol (ZYLOPRIM) 300 MG tablet Take 1 tablet (300 mg total) by mouth daily. 11/16/16  Yes [name], MD  aspirin EC 81 MG tablet Take 81 mg by mouth daily.   Yes [provider]  atorvastatin (LIPITOR) 40 MG tablet Take 1 tablet (40 mg total) by mouth daily at 6 PM. 11/16/16  Yes [name], MD  bumetanide (BUMEX) 1 MG tablet Take 1 mg by mouth 2 (two) times daily.   Yes [provider]  carvedilol (COREG) 12.5 MG tablet Take 12.5 mg by mouth 2 (two) times daily with a meal.   Yes [provider]  cholecalciferol (VITAMIN D) 1000 units tablet Take 2,000 Units by mouth daily.   Yes [provider]  ferrous sulfate (IRON SUPPLEMENT) 325 (65 FE) MG tablet Take 325 mg by mouth daily with breakfast.  Yes [provider]  Multiple Vitamin (MULTIVITAMIN) capsule Take 1 capsule by mouth daily.   Yes [provider]  nitroGLYCERIN (NITROSTAT) 0.4 MG SL tablet Place 0.4 mg under the tongue every 5 (five) minutes as needed for chest pain.   Yes [provider]  Probiotic Product (SOLUBLE FIBER/PROBIOTICS PO) Take by mouth.   Yes [provider]  sacubitril-valsartan (ENTRESTO) 24-26 MG Take 1 tablet by mouth 2 (two) times daily.   Yes [provider]  spironolactone (ALDACTONE) 25 MG tablet Take 25 mg by mouth daily.   Yes [provider]  vitamin C (ASCORBIC ACID) 500 MG tablet Take 500 mg by mouth daily.   Yes [provider]    Family History Family History  Problem Relation Age of Onset  . Stroke Mother   . Heart disease Father 25  . Multiple myeloma  Brother     Social History Social History  Substance Use Topics  . Smoking status: Former Smoker    Packs/day: 0.50    Types: Cigarettes    Start date: 06/14/1970    Quit date: 06/14/2000  . Smokeless tobacco: Never Used  . Alcohol use Yes     Comment: occas social drink     Allergies   Penicillins   Review of Systems Review of Systems  All other systems reviewed and are negative.  Physical Exam Updated Vital Signs BP (!) 113/55   Pulse 95   Temp 97.7 F (36.5 C) (Oral)   Resp 16   Ht 5' 7"  (1.702 m)   Wt 90.7 kg (200 lb)   SpO2 93%   BMI 31.32 kg/m   Physical Exam  Constitutional: He appears well-developed and well-nourished. No distress.  HENT:  Head: Normocephalic and atraumatic.  Mouth/Throat: Oropharynx is clear and moist. No oropharyngeal exudate.  Eyes: Conjunctivae and EOM are normal. Pupils are equal, round, and reactive to light. Right eye exhibits no discharge. Left eye exhibits no discharge. No scleral icterus.  Neck: Normal range of motion. Neck supple. No JVD present. No thyromegaly present.  Cardiovascular: Normal rate, regular rhythm and intact distal pulses.  Exam reveals no gallop and no friction rub.   Murmur ( soft systolic) heard. Pulmonary/Chest: Effort normal and breath sounds normal. No respiratory distress. He has no wheezes. He has no rales.  Normal lung sounds  Abdominal: Soft. Bowel sounds are normal. He exhibits no distension and no mass. There is no tenderness.  Musculoskeletal: Normal range of motion. He exhibits edema ( symmertical 1-2+ edema of the LE's). He exhibits no tenderness.  Lymphadenopathy:    He has no cervical adenopathy.  Neurological: He is alert. Coordination normal.  Skin: Skin is warm and dry. No rash noted. No erythema.  Psychiatric: He has a normal mood and affect. His behavior is normal.  Nursing note and vitals reviewed.    ED Treatments / Results  Labs (all labs ordered are listed, but only abnormal  results are displayed) Labs Reviewed  CBC WITH DIFFERENTIAL/PLATELET - Abnormal; Notable for the following:       Result Value   RBC 3.78 (*)    Hemoglobin 12.4 (*)    HCT 38.7 (*)    MCV 102.4 (*)    RDW 16.5 (*)    Platelets 137 (*)    All other components within normal limits  BASIC METABOLIC PANEL - Abnormal; Notable for the following:    Glucose, Bld 112 (*)    BUN 83 (*)  Creatinine, Ser 2.25 (*)    GFR calc non Af Amer 28 (*)    GFR calc Af Amer 33 (*)    All other components within normal limits  TROPONIN I - Abnormal; Notable for the following:    Troponin I 0.04 (*)    All other components within normal limits  BRAIN NATRIURETIC PEPTIDE - Abnormal; Notable for the following:    B Natriuretic Peptide 1,536.0 (*)    All other components within normal limits  TROPONIN I - Abnormal; Notable for the following:    Troponin I 0.04 (*)    All other components within normal limits    EKG  EKG Interpretation  Date/Time:  Friday November 19 2016 09:15:27 EDT Ventricular Rate:  103 PR Interval:    QRS Duration: 159 QT Interval:  411 QTC Calculation: 486 R Axis:   -173 Text Interpretation:  Ventricular-paced complexes No further analysis attempted due to paced rhythm PVC's in addition to paced rhythm Abnormal ekg No old tracing to compare Confirmed by Noemi Chapel [phone]) on 11/19/2016 9:33:08 AM        Radiology Dg Chest 2 View  Result Date: 11/19/2016 CLINICAL DATA:  Dizziness. EXAM: CHEST  2 VIEW COMPARISON:  10/26/2016 FINDINGS: Chronic cardiomegaly. AICD in place. Overall pulmonary vascularity is normal. Slight chronic accentuation of the interstitial markings. No effusions.  No acute bone abnormality. IMPRESSION: No acute abnormalities.  Chronic cardiomegaly. Electronically Signed   By: Lorriane Shire M.D.   On: 11/19/2016 10:24    Procedures Procedures (including critical care time)  Medications Ordered in ED Medications  0.9 %  sodium chloride infusion (  Intravenous Stopped 11/19/16 1350)  aspirin chewable tablet 324 mg (324 mg Oral Given 11/19/16 0937)  furosemide (LASIX) injection 40 mg (40 mg Intravenous Given 11/19/16 1408)     Initial Impression / Assessment and Plan / ED Course  I have reviewed the triage vital signs and the nursing notes.  Pertinent labs & imaging results that were available during my care of the patient were reviewed by me and considered in my medical decision making (see chart for details).  Etiology of the patients symptoms could be from his heart, dehydration, pneumonia.  He has known CHF but does not appear to have pulmonary edema - he has an abnormal ECG but no signs of ischemia and no CP or SOB.  He will need BNP, Trop, CXR, monitoring, orhostatics.  Pt in agreement.  I discussed the patient's care with the hospitalist as he has had ongoing hypotension, he had approximately 2 L of peripheral edema diuresed, he continues to be orthostatic despite getting some IV fluids. His BNP was elevated, no other acute findings to suggest an etiology of his symptoms. He will be admitted to the hospitalist, appreciate Dr. Nehemiah Settle and his willingness to admit the patient to hospital.   Final Clinical Impressions(s) / ED Diagnoses   Final diagnoses:  Congestive heart failure, unspecified HF chronicity, unspecified heart failure type ([place])  Hypotension, unspecified hypotension type    New Prescriptions New Prescriptions   No medications on file     Noemi Chapel, MD 11/19/16 1554

## 2016-11-20 ENCOUNTER — Observation Stay (HOSPITAL_BASED_OUTPATIENT_CLINIC_OR_DEPARTMENT_OTHER): Payer: Medicare Other

## 2016-11-20 DIAGNOSIS — Z951 Presence of aortocoronary bypass graft: Secondary | ICD-10-CM | POA: Diagnosis not present

## 2016-11-20 DIAGNOSIS — I5021 Acute systolic (congestive) heart failure: Secondary | ICD-10-CM

## 2016-11-20 DIAGNOSIS — I360 Nonrheumatic tricuspid (valve) stenosis: Secondary | ICD-10-CM

## 2016-11-20 DIAGNOSIS — Z952 Presence of prosthetic heart valve: Secondary | ICD-10-CM | POA: Diagnosis not present

## 2016-11-20 LAB — ECHOCARDIOGRAM COMPLETE
AO mean calculated velocity dopler: 145 cm/s
AOPV: 0.58 m/s
AOVTI: 49.4 cm
AV Area VTI index: 0.95 cm2/m2
AV Area VTI: 2 cm2
AV Area mean vel: 2.25 cm2
AV Peak grad: 22 mmHg
AV vel: 2.02
AVA: 2.02 cm2
AVAREAMEANVIN: 1.06 cm2/m2
AVCELMEANRAT: 0.65
AVG: 11 mmHg
AVPKVEL: 237 cm/s
CHL CUP AV PEAK INDEX: 0.94
CHL CUP LVOT MV VTI INDEX: 0.33 cm2/m2
CHL CUP LVOT MV VTI: 0.7
CHL CUP MV DEC (S): 201
CHL CUP RV SYS PRESS: 31 mmHg
CHL CUP TV REG PEAK VELOCITY: 239 cm/s
E decel time: 201 msec
FS: 8 % — AB (ref 28–44)
Height: 67 in
IV/PV OW: 0.93
LA ID, A-P, ES: 63 mm
LA diam index: 2.96 cm/m2
LA vol index: 85.2 mL/m2
LAVOL: 181 mL
LAVOLA4C: 166 mL
LEFT ATRIUM END SYS DIAM: 63 mm
LV PW d: 12.5 mm — AB (ref 0.6–1.1)
LV SIMPSON'S DISK: 18
LVDIAVOL: 340 mL — AB (ref 62–150)
LVDIAVOLIN: 160 mL/m2
LVOT SV: 100 mL
LVOT VTI: 28.9 cm
LVOT area: 3.46 cm2
LVOT diameter: 21 mm
LVOT peak grad rest: 8 mmHg
LVOTPV: 137 cm/s
LVOTVTI: 0.59 cm
LVSYSVOL: 279 mL — AB (ref 21–61)
LVSYSVOLIN: 131 mL/m2
MV M vel: 333
MV Peak grad: 13 mmHg
MV pk E vel: 183 m/s
MVANNULUSVTI: 142 cm
MVPKAVEL: 145 m/s
Mean grad: 53 mmHg
Stroke v: 61 ml
TAPSE: 14.8 mm
TRMAXVEL: 239 cm/s
VTI: 141 cm
Valve area index: 0.95
Weight: 3273.39 oz

## 2016-11-20 LAB — BASIC METABOLIC PANEL
Anion gap: 9 (ref 5–15)
BUN: 74 mg/dL — ABNORMAL HIGH (ref 6–20)
CALCIUM: 8.7 mg/dL — AB (ref 8.9–10.3)
CHLORIDE: 105 mmol/L (ref 101–111)
CO2: 24 mmol/L (ref 22–32)
Creatinine, Ser: 2.11 mg/dL — ABNORMAL HIGH (ref 0.61–1.24)
GFR calc non Af Amer: 31 mL/min — ABNORMAL LOW (ref >60)
GFR, EST AFRICAN AMERICAN: 35 mL/min — AB (ref >60)
GLUCOSE: 102 mg/dL — AB (ref 65–99)
Potassium: 4.5 mmol/L (ref 3.5–5.1)
Sodium: 138 mmol/L (ref 135–145)

## 2016-11-20 MED ORDER — PERFLUTREN LIPID MICROSPHERE
1.0000 mL | INTRAVENOUS | Status: AC | PRN
  Administered 2016-11-20 (×2): 1 mL via INTRAVENOUS
  Filled 2016-11-20: qty 10

## 2016-11-20 NOTE — Progress Notes (Signed)
*  PRELIMINARY RESULTS* Echocardiogram 2D Echocardiogram has been performed with Definity.  Samuel Germany 11/20/2016, 12:13 PM

## 2016-11-20 NOTE — Progress Notes (Signed)
PROGRESS NOTE    Dillon Cisneros  TKW:[id number] DOB: [birth year] DOA: 11/19/2016 PCP: [name], MD    Brief Narrative: 66 yo with known CAD s/p CABG 2017, bioprosthetic AVR and MVR, HTN, CKD, HLD, conbined CHF ?  25% EF, s/p ppm and IVCD, recently moved here, planned to see Dr Harl Bowie on Monday to get established, admitted with CHF, volume overload, presented as SOB.  He was given IV Lasix and felt better today.    Assessment & Plan:   Principal Problem:   Acute systolic CHF (congestive heart failure) (HCC) Active Problems:   Hx of CABG   Stage 4 chronic kidney disease (HCC)   H/O aortic valve replacement   ICD (implantable cardioverter-defibrillator) in place   Hypotension  Acute combined CHF:  He has been on Bumex and compliant.  Will continue with IV lasix for diuresis.  Likely be able to go home tomorrow. His BP is soft, and cardiac meds have been adjusted (hold ACE I and reduced Coreg.)  S/p AVR and MVR:  Bioprosthetic.  Stable.   Stage IV CKD:  Cr is improving with diuresis.  This is a good sign.  Will follow closely.   Slight elevation of troponins:  0.04 and stable.  Unlikely ACS.    DVT prophylaxis: Lovenox.  Code Status: FULL CODE.  Family Communication: None at bedside.  Disposition Plan: Home..  Consultants:   None.   Procedures:   None.   Antimicrobials: Anti-infectives    None       Subjective:  I feel a lot better.    Objective: Vitals:   11/19/16 1824 11/19/16 2100 11/20/16 0500 11/20/16 1150  BP: (!) 82/47 92/62 (!) 105/53 102/68  Pulse: 92 87 93 89  Resp: 16 18 18    Temp: 98.3 F (36.8 C) 98.3 F (36.8 C) 97.6 F (36.4 C)   TempSrc: Oral Oral Oral   SpO2: 97% 97% 97%   Weight: 93 kg (205 lb)  92.8 kg (204 lb 9.4 oz)   Height: 5\' 7"  (1.702 m)       Intake/Output Summary (Last 24 hours) at 11/20/16 1344 Last data filed at 11/20/16 0900  Gross per 24 hour  Intake          1546.67 ml  Output             1000 ml  Net            546.67 ml   Filed Weights   11/19/16 0913 11/19/16 1824 11/20/16 0500  Weight: 90.7 kg (200 lb) 93 kg (205 lb) 92.8 kg (204 lb 9.4 oz)    Examination:  General exam: Appears calm and comfortable  Respiratory system: Clear to auscultation. Respiratory effort normal. Cardiovascular system: S1 & S2 heard, RRR. No JVD, murmurs, rubs, gallops or clicks. No pedal edema. Gastrointestinal system: Abdomen is nondistended, soft and nontender. No organomegaly or masses felt. Normal bowel sounds heard. Central nervous system: Alert and oriented. No focal neurological deficits. Extremities: Symmetric 5 x 5 power. Skin: No rashes, lesions or ulcers Psychiatry: Judgement and insight appear normal. Mood & affect appropriate.   Data Reviewed: I have personally reviewed following labs and imaging studies  CBC:  Recent Labs Lab 11/19/16 0916  WBC 6.7  NEUTROABS 5.1  HGB 12.4*  HCT 38.7*  MCV 102.4*  PLT 924*   Basic Metabolic Panel:  Recent Labs Lab 11/19/16 0916 11/20/16 0611  NA 137 138  K 4.8 4.5  CL 104 105  CO2  23 24  GLUCOSE 112* 102*  BUN 83* 74*  CREATININE 2.25* 2.11*  CALCIUM 9.3 8.7*   GFR: Cardiac Enzymes:  Recent Labs Lab 11/19/16 0916 11/19/16 1311  TROPONINI 0.04* 0.04*   Radiology Studies: Dg Chest 2 View  Result Date: 11/19/2016 CLINICAL DATA:  Dizziness. EXAM: CHEST  2 VIEW COMPARISON:  10/26/2016 FINDINGS: Chronic cardiomegaly. AICD in place. Overall pulmonary vascularity is normal. Slight chronic accentuation of the interstitial markings. No effusions.  No acute bone abnormality. IMPRESSION: No acute abnormalities.  Chronic cardiomegaly. Electronically Signed   By: Lorriane Shire M.D.   On: 11/19/2016 10:24    Scheduled Meds: . allopurinol  300 mg Oral Daily  . aspirin EC  81 mg Oral Daily  . atorvastatin  40 mg Oral q1800  . carvedilol  3.125 mg Oral BID WC  . enoxaparin (LOVENOX) injection  40 mg Subcutaneous Q24H  . furosemide  40 mg Intravenous  BID  . potassium chloride  40 mEq Oral BID  . sodium chloride flush  3 mL Intravenous Q12H  . spironolactone  25 mg Oral Daily   Continuous Infusions: . sodium chloride       LOS: 0 days   Carole Doner, MD FACP Hospitalist.   If 7PM-7AM, please contact night-coverage [email or web address] Password [email or web address] 11/20/2016, 1:44 PM

## 2016-11-21 DIAGNOSIS — I493 Ventricular premature depolarization: Secondary | ICD-10-CM | POA: Diagnosis present

## 2016-11-21 DIAGNOSIS — Z85828 Personal history of other malignant neoplasm of skin: Secondary | ICD-10-CM | POA: Diagnosis not present

## 2016-11-21 DIAGNOSIS — Z95810 Presence of automatic (implantable) cardiac defibrillator: Secondary | ICD-10-CM | POA: Diagnosis not present

## 2016-11-21 DIAGNOSIS — I5021 Acute systolic (congestive) heart failure: Secondary | ICD-10-CM | POA: Diagnosis not present

## 2016-11-21 DIAGNOSIS — Z8249 Family history of ischemic heart disease and other diseases of the circulatory system: Secondary | ICD-10-CM | POA: Diagnosis not present

## 2016-11-21 DIAGNOSIS — Z951 Presence of aortocoronary bypass graft: Secondary | ICD-10-CM | POA: Diagnosis not present

## 2016-11-21 DIAGNOSIS — Z7982 Long term (current) use of aspirin: Secondary | ICD-10-CM | POA: Diagnosis not present

## 2016-11-21 DIAGNOSIS — Z880 Allergy status to penicillin: Secondary | ICD-10-CM | POA: Diagnosis not present

## 2016-11-21 DIAGNOSIS — Z807 Family history of other malignant neoplasms of lymphoid, hematopoietic and related tissues: Secondary | ICD-10-CM | POA: Diagnosis not present

## 2016-11-21 DIAGNOSIS — I130 Hypertensive heart and chronic kidney disease with heart failure and stage 1 through stage 4 chronic kidney disease, or unspecified chronic kidney disease: Secondary | ICD-10-CM | POA: Diagnosis present

## 2016-11-21 DIAGNOSIS — I2510 Atherosclerotic heart disease of native coronary artery without angina pectoris: Secondary | ICD-10-CM | POA: Diagnosis present

## 2016-11-21 DIAGNOSIS — N184 Chronic kidney disease, stage 4 (severe): Secondary | ICD-10-CM | POA: Diagnosis present

## 2016-11-21 DIAGNOSIS — Z79899 Other long term (current) drug therapy: Secondary | ICD-10-CM | POA: Diagnosis not present

## 2016-11-21 DIAGNOSIS — I5043 Acute on chronic combined systolic (congestive) and diastolic (congestive) heart failure: Secondary | ICD-10-CM | POA: Diagnosis present

## 2016-11-21 DIAGNOSIS — I420 Dilated cardiomyopathy: Secondary | ICD-10-CM | POA: Diagnosis present

## 2016-11-21 DIAGNOSIS — R42 Dizziness and giddiness: Secondary | ICD-10-CM | POA: Diagnosis present

## 2016-11-21 DIAGNOSIS — Z87891 Personal history of nicotine dependence: Secondary | ICD-10-CM | POA: Diagnosis not present

## 2016-11-21 DIAGNOSIS — I252 Old myocardial infarction: Secondary | ICD-10-CM | POA: Diagnosis not present

## 2016-11-21 DIAGNOSIS — Z953 Presence of xenogenic heart valve: Secondary | ICD-10-CM | POA: Diagnosis not present

## 2016-11-21 DIAGNOSIS — E785 Hyperlipidemia, unspecified: Secondary | ICD-10-CM | POA: Diagnosis present

## 2016-11-21 DIAGNOSIS — I959 Hypotension, unspecified: Secondary | ICD-10-CM | POA: Diagnosis present

## 2016-11-21 LAB — BASIC METABOLIC PANEL
ANION GAP: 8 (ref 5–15)
BUN: 61 mg/dL — ABNORMAL HIGH (ref 6–20)
CO2: 25 mmol/L (ref 22–32)
Calcium: 8.9 mg/dL (ref 8.9–10.3)
Chloride: 104 mmol/L (ref 101–111)
Creatinine, Ser: 1.71 mg/dL — ABNORMAL HIGH (ref 0.61–1.24)
GFR calc Af Amer: 46 mL/min — ABNORMAL LOW (ref >60)
GFR, EST NON AFRICAN AMERICAN: 39 mL/min — AB (ref >60)
Glucose, Bld: 113 mg/dL — ABNORMAL HIGH (ref 65–99)
POTASSIUM: 4.5 mmol/L (ref 3.5–5.1)
SODIUM: 137 mmol/L (ref 135–145)

## 2016-11-21 MED ORDER — CARVEDILOL 3.125 MG PO TABS: 3.1250 mg | ORAL_TABLET | Freq: Two times a day (BID) | ORAL | 2 refills | Status: DC

## 2016-11-21 NOTE — Discharge Summary (Signed)
Physician Discharge Summary  Dillon Cisneros TFT:[id number] DOB: 09-30-1950 DOA: 11/19/2016  PCP: [name], MD  Admit date: 11/19/2016 Discharge date: 11/21/2016  Admitted From: Home.  Disposition:  Home.   Recommendations for Outpatient Follow-up:  1. Follow up with PCP in 1-2 weeks 2. He has an appointment with his cardiologist Dr Harl Bowie tomorrow.    Home Health: None.  Equipment/Devices: none.  Discharge Condition: No SOB, no lightheadedness with soft BP, no CP.  CODE STATUS: FULL CODE.  Diet recommendation: Cardiac diet.   Brief/Interim Summary:  Patient was admitted for acute systolic CHF, hypotension and bradycardia by Dr Nehemiah Settle on November 19, 2016.  As per his H and P:  " Dillon Cisneros is a 66 y.o. male with a history of coronary artery disease status post bypass in July 2017 with aortic valve replacement and mitral valve repair, hypertension, chronic kidney disease stage IV, hyperlipidemia, history of MI, combined chronic heart failure with EF of approximately 25% per patient report in September of last year. Patient and his wife has been fairly nomadic over the past 8 years and have had variety of different cardiologists. They recently settle down to the [place] area and bottom house with plans to stay here. He has an appointment with Dr. Harl Bowie at the beginning of the week. He has had no problems with his surgery. This morning he woke up and felt lightheaded particularly with standing. He noted his heart rate was in the 40s on his home blood pressure cuff and noted that his blood pressure was low. He came to the hospital for evaluation. He denies chest pain, shortness of breath, orthopnea, dyspnea on exertion. Does have a slightly productive cough for the past several days. No fevers, chills.  Emergency Department Course: Workup reveals a stable cyst minimally elevated troponin. He does have a BNP of 1500. The patient was given 40 mg of IV Lasix and diuresed over 2 L.  HOSPITAL  COURSE:  Patient was admitted and he was further diuresed for another day, with IV Furosemide, and his home diuretics were continued.  His CKD and Cr was monitored carefully, and it actually improved, indicative of increased GFR, likely from improved cardiac output.  His Coreg was reduced, from 12.5mg  BID, down to 3.125mg   BID.  With that his SBP was around 90-95, but he felt much better.  He said his BP has ALWAYS been low, and he has been rather asymptomatic with soft BP ever since his CABG.  He did not have any CP, and there was no evidence of ischemia at this time.  His troponin was negative, and his EKG showed paced rhythm.  An ECHO was done, and it showed severe dilated cardiomyopathy, with EF of only about 20 to 25%.  Be reminded that he has a ppm and IVCD in place.  He does have an appointment with Dr Zandra Abts, cardiology to get established, as he has been travelling, and had several cardiologist before, until now, where he is going to get settle here in [place].  He felt quite well now, and will be discharged home today.  He will keep his appointment with Dr Harl Bowie tomorrow. Thank you so much and Good Day.   Discharge Diagnoses:  Principal Problem:   Acute systolic CHF (congestive heart failure) (HCC) Active Problems:   Hx of CABG   Stage 4 chronic kidney disease (HCC)   H/O aortic valve replacement   ICD (implantable cardioverter-defibrillator) in place   Hypotension   CHF (congestive  heart failure) [facility])    Discharge Instructions  Discharge Instructions    Diet - low sodium heart healthy    Complete by:  As directed    Increase activity slowly    Complete by:  As directed      Allergies as of 11/21/2016      Reactions   Penicillins Hives   Has patient had a PCN reaction causing immediate rash, facial/tongue/throat swelling, SOB or lightheadedness with hypotension: No Has patient had a PCN reaction causing severe rash involving mucus membranes or skin necrosis: No Has  patient had a PCN reaction that required hospitalization: No Has patient had a PCN reaction occurring within the last 10 years: No If all of the above answers are "NO", then may proceed with Cephalosporin use.      Medication List    TAKE these medications   allopurinol 300 MG tablet Commonly known as:  ZYLOPRIM Take 1 tablet (300 mg total) by mouth daily.   aspirin EC 81 MG tablet Take 81 mg by mouth daily.   atorvastatin 40 MG tablet Commonly known as:  LIPITOR Take 1 tablet (40 mg total) by mouth daily at 6 PM.   bumetanide 1 MG tablet Commonly known as:  BUMEX Take 1 mg by mouth 2 (two) times daily.   carvedilol 3.125 MG tablet Commonly known as:  COREG Take 1 tablet (3.125 mg total) by mouth 2 (two) times daily with a meal. What changed:  medication strength  how much to take   cholecalciferol 1000 units tablet Commonly known as:  VITAMIN D Take 2,000 Units by mouth daily.   ENTRESTO 24-26 MG Generic drug:  sacubitril-valsartan Take 1 tablet by mouth 2 (two) times daily.   IRON SUPPLEMENT 325 (65 FE) MG tablet Generic drug:  ferrous sulfate Take 325 mg by mouth daily with breakfast.   multivitamin capsule Take 1 capsule by mouth daily.   nitroGLYCERIN 0.4 MG SL tablet Commonly known as:  NITROSTAT Place 0.4 mg under the tongue every 5 (five) minutes as needed for chest pain.   SOLUBLE FIBER/PROBIOTICS PO Take by mouth.   spironolactone 25 MG tablet Commonly known as:  ALDACTONE Take 25 mg by mouth daily.   vitamin C 500 MG tablet Commonly known as:  ASCORBIC ACID Take 500 mg by mouth daily.       Allergies  Allergen Reactions  . Penicillins Hives    Has patient had a PCN reaction causing immediate rash, facial/tongue/throat swelling, SOB or lightheadedness with hypotension: No Has patient had a PCN reaction causing severe rash involving mucus membranes or skin necrosis: No Has patient had a PCN reaction that required hospitalization: No Has  patient had a PCN reaction occurring within the last 10 years: No If all of the above answers are "NO", then may proceed with Cephalosporin use.     Consultations:  None.    Procedures/Studies: Dg Chest 2 View  Result Date: 11/19/2016 CLINICAL DATA:  Dizziness. EXAM: CHEST  2 VIEW COMPARISON:  10/26/2016 FINDINGS: Chronic cardiomegaly. AICD in place. Overall pulmonary vascularity is normal. Slight chronic accentuation of the interstitial markings. No effusions.  No acute bone abnormality. IMPRESSION: No acute abnormalities.  Chronic cardiomegaly. Electronically Signed   By: Lorriane Shire M.D.   On: 11/19/2016 10:24   Dg Chest 2 View  Result Date: 10/26/2016 CLINICAL DATA:  Cough, shortness of breath, and weakness. History of CHF, previous MI, chronic renal insufficiency stage IV. EXAM: CHEST  2 VIEW COMPARISON:  None  in [facility] FINDINGS: The lungs are well-expanded. There is no focal infiltrate. The interstitial markings are increased diffusely. The cardiac silhouette is enlarged. The central pulmonary vascularity is mildly prominent. The patient has undergone previous left atrial appendage clip placement and presumably mitral valve replacement. The ICD is in stable position. There is calcification in the wall of the aortic arch. There is no pleural effusion. The observed bony thorax is unremarkable. IMPRESSION: CHF with mild pulmonary interstitial edema. No alveolar edema or pneumonia. No significant pleural effusion. The ICD is in stable position. Thoracic aortic atherosclerosis. Electronically Signed   By: David  Martinique M.D.   On: 10/26/2016 15:19    Subjective: I feel good.   Discharge Exam: Vitals:   11/20/16 2100 11/21/16 0500  BP: (!) 88/57 (!) 93/53  Pulse: 88 92  Resp: 18 18  Temp: 98.3 F (36.8 C) 97.7 F (36.5 C)   Vitals:   11/20/16 1150 11/20/16 1442 11/20/16 2100 11/21/16 0500  BP: 102/68 (!) 96/46 (!) 88/57 (!) 93/53  Pulse: 89 (!) 103 88 92  Resp:  18 18 18   Temp:   97.7 F (36.5 C) 98.3 F (36.8 C) 97.7 F (36.5 C)  TempSrc:  Oral Oral Oral  SpO2:  94% 96% 97%  Weight:    92.7 kg (204 lb 5.9 oz)  Height:        General: Pt is alert, awake, not in acute distress Cardiovascular: RRR, 2/6 SEM, no rubs, no gallops Respiratory: CTA bilaterally, no wheezing, no rhonchi Abdominal: Soft, NT, ND, bowel sounds + Extremities: no edema, no cyanosis    The results of significant diagnostics from this hospitalization (including imaging, microbiology, ancillary and laboratory) are listed below for reference.     Labs: BNP (last 3 results)  Recent Labs  11/19/16 0916  BNP 7,341.9*   Basic Metabolic Panel:  Recent Labs Lab 11/19/16 0916 11/20/16 0611 11/21/16 0534  NA 137 138 137  K 4.8 4.5 4.5  CL 104 105 104  CO2 23 24 25   GLUCOSE 112* 102* 113*  BUN 83* 74* 61*  CREATININE 2.25* 2.11* 1.71*  CALCIUM 9.3 8.7* 8.9   CBC:  Recent Labs Lab 11/19/16 0916  WBC 6.7  NEUTROABS 5.1  HGB 12.4*  HCT 38.7*  MCV 102.4*  PLT 137*   Cardiac Enzymes:  Recent Labs Lab 11/19/16 0916 11/19/16 1311  TROPONINI 0.04* 0.04*   Urinalysis    Component Value Date/Time   COLORURINE YELLOW 10/26/2016 1430   APPEARANCEUR CLEAR 10/26/2016 1430   LABSPEC 1.014 10/26/2016 1430   PHURINE 5.5 10/26/2016 1430   GLUCOSEU NEGATIVE 10/26/2016 1430   HGBUR NEGATIVE 10/26/2016 [place] 10/26/2016 1430   [place] 10/26/2016 1430   PROTEINUR NEGATIVE 10/26/2016 1430   NITRITE NEGATIVE 10/26/2016 1430   LEUKOCYTESUR NEGATIVE 10/26/2016 1430   Time coordinating discharge: Over 30 minutes SIGNED:  Orvan Falconer, MD FACP [facility] 11/21/2016, 9:45 AM   If 7PM-7AM, please contact night-coverage [email or web address] Password [email or web address]

## 2016-11-22 ENCOUNTER — Encounter: Payer: Self-pay | Admitting: Cardiology

## 2016-11-22 ENCOUNTER — Telehealth: Payer: Self-pay

## 2016-11-22 ENCOUNTER — Ambulatory Visit (INDEPENDENT_AMBULATORY_CARE_PROVIDER_SITE_OTHER): Payer: Medicare Other | Admitting: Cardiology

## 2016-11-22 VITALS — BP 98/60 | HR 100 | Ht 67.0 in | Wt 198.0 lb

## 2016-11-22 DIAGNOSIS — I2510 Atherosclerotic heart disease of native coronary artery without angina pectoris: Secondary | ICD-10-CM | POA: Diagnosis not present

## 2016-11-22 DIAGNOSIS — I10 Essential (primary) hypertension: Secondary | ICD-10-CM | POA: Diagnosis not present

## 2016-11-22 DIAGNOSIS — N184 Chronic kidney disease, stage 4 (severe): Secondary | ICD-10-CM

## 2016-11-22 DIAGNOSIS — I5022 Chronic systolic (congestive) heart failure: Secondary | ICD-10-CM | POA: Diagnosis not present

## 2016-11-22 NOTE — Patient Instructions (Signed)
Medication Instructions:  Your physician recommends that you continue on your current medications as directed. Please refer to the Current Medication list given to you today.   Labwork: NONE  Testing/Procedures: NONE  Follow-Up: Your physician recommends that you schedule a follow-up appointment in: [place] physician recommends that you schedule a follow-up appointment in: ASAP     Any Other Special Instructions Will Be Listed Below (If Applicable). WE WILL SEND FOR CARDIAC RECORDS FROM [facility]    If you need a refill on your cardiac medications before your next appointment, please call your pharmacy.

## 2016-11-22 NOTE — Progress Notes (Addendum)
Clinical Summary [name] is a 66 y.o.male seen as new patient, referred by Dr Meda Coffee for history of CAD and chronic systolic heart failure.   1. CAD/ICM/Chronic systolic HF - reports long history of severe systolic HF. Has been evaluted at multiple medical centers over the years - history of CABG with bioprosthetic AVR and MV repari in July 2017 at [facility] in [place], [place].  - he reports ICD first placed in 2007, upgrated to a "3 wire device" in June 2015 at [facility] in [place], [place]. He received a St Jude device, has not had checked in some time.  - echo 11/2016 LVEF 20-25%  - admit 11/2016 with lightheadness/dizziness.  +LE edema, increased weight gain. He increased bumex on his own without benefit - he is unsure of baseline weights - low heart rates at home, running around low 40s by his home finger monitor.   - symptoms improved with diuresis, lower dose of coreg.  - limiting sodium intake. Avoiding NSAIDs.   2. AVR - history of AVF July 2017, along with MV repair - echo 11/2015 shows normal AVR function.   3. CKD III- IV - Cr improved with diuresis during recent admission.    Past Medical History:  Diagnosis Date  . Allergy    penicillin  . Arthritis    arthritis  . Blood transfusion without reported diagnosis    open heart  . Cancer ([place])    skin cancer  . CHF (congestive heart failure) ([place])   . Chronic kidney disease    stage 4 kidney failure  . Heart murmur   . Hyperlipidemia   . Hypertension   . Myocardial infarction (HCC)      Allergies  Allergen Reactions  . Penicillins Hives    Has patient had a PCN reaction causing immediate rash, facial/tongue/throat swelling, SOB or lightheadedness with hypotension: No Has patient had a PCN reaction causing severe rash involving mucus membranes or skin necrosis: No Has patient had a PCN reaction that required hospitalization: No Has patient had a PCN reaction occurring within the  last 10 years: No If all of the above answers are "NO", then may proceed with Cephalosporin use.      Current Outpatient Prescriptions  Medication Sig Dispense Refill  . allopurinol (ZYLOPRIM) 300 MG tablet Take 1 tablet (300 mg total) by mouth daily. 90 tablet 3  . aspirin EC 81 MG tablet Take 81 mg by mouth daily.    Marland Kitchen atorvastatin (LIPITOR) 40 MG tablet Take 1 tablet (40 mg total) by mouth daily at 6 PM. 90 tablet 3  . bumetanide (BUMEX) 1 MG tablet Take 1 mg by mouth 2 (two) times daily.    . carvedilol (COREG) 3.125 MG tablet Take 1 tablet (3.125 mg total) by mouth 2 (two) times daily with a meal. 30 tablet 2  . cholecalciferol (VITAMIN D) 1000 units tablet Take 2,000 Units by mouth daily.    . ferrous sulfate (IRON SUPPLEMENT) 325 (65 FE) MG tablet Take 325 mg by mouth daily with breakfast.    . Multiple Vitamin (MULTIVITAMIN) capsule Take 1 capsule by mouth daily.    . nitroGLYCERIN (NITROSTAT) 0.4 MG SL tablet Place 0.4 mg under the tongue every 5 (five) minutes as needed for chest pain.    . Probiotic Product (SOLUBLE FIBER/PROBIOTICS PO) Take by mouth.    . sacubitril-valsartan (ENTRESTO) 24-26 MG Take 1 tablet by mouth 2 (two) times daily.    Marland Kitchen spironolactone (  ALDACTONE) 25 MG tablet Take 25 mg by mouth daily.    . vitamin C (ASCORBIC ACID) 500 MG tablet Take 500 mg by mouth daily.     No current facility-administered medications for this visit.      Past Surgical History:  Procedure Laterality Date  . CORONARY ARTERY BYPASS GRAFT  12/2015  . JOINT REPLACEMENT     both knees replaced  . KNEE SURGERY     bilateral  . SHOULDER SURGERY     left     Allergies  Allergen Reactions  . Penicillins Hives    Has patient had a PCN reaction causing immediate rash, facial/tongue/throat swelling, SOB or lightheadedness with hypotension: No Has patient had a PCN reaction causing severe rash involving mucus membranes or skin necrosis: No Has patient had a PCN reaction that  required hospitalization: No Has patient had a PCN reaction occurring within the last 10 years: No If all of the above answers are "NO", then may proceed with Cephalosporin use.       Family History  Problem Relation Age of Onset  . Stroke Mother   . Heart disease Father 49  . Multiple myeloma Brother      Social History [name] reports that he quit smoking about 16 years ago. His smoking use included Cigarettes. He started smoking about 46 years ago. He smoked 0.50 packs per day. He has never used smokeless tobacco. [name] reports that he drinks alcohol.   Review of Systems CONSTITUTIONAL: No weight loss, fever, chills, weakness or fatigue.  HEENT: Eyes: No visual loss, blurred vision, double vision or yellow sclerae.No hearing loss, sneezing, congestion, runny nose or sore throat.  SKIN: No rash or itching.  CARDIOVASCULAR: per HPI RESPIRATORY: No shortness of breath, cough or sputum.  GASTROINTESTINAL: No anorexia, nausea, vomiting or diarrhea. No abdominal pain or blood.  GENITOURINARY: No burning on urination, no polyuria NEUROLOGICAL: No headache, dizziness, syncope, paralysis, ataxia, numbness or tingling in the extremities. No change in bowel or bladder control.  MUSCULOSKELETAL: No muscle, back pain, joint pain or stiffness.  LYMPHATICS: No enlarged nodes. No history of splenectomy.  PSYCHIATRIC: No history of depression or anxiety.  ENDOCRINOLOGIC: No reports of sweating, cold or heat intolerance. No polyuria or polydipsia.  .   Physical Examination Vitals:   11/22/16 0908 11/22/16 0912  BP: 102/60 98/60  Pulse: 100    Vitals:   11/22/16 0908  Weight: 198 lb (89.8 kg)  Height: 5' 7" (1.702 m)    Gen: resting comfortably, no acute distress HEENT: no scleral icterus, pupils equal round and reactive, no palptable cervical adenopathy,  CV: RRR, 2/6 systolic murmur RUSB and at apex, no jvd Resp: Clear to auscultation bilaterally GI: abdomen is soft,  non-tender, non-distended, normal bowel sounds, no hepatosplenomegaly MSK: extremities are warm, trace bilateral LE edema Skin: warm, no rash Neuro:  no focal deficits Psych: appropriate affect   Diagnostic Studies  11/2016 echo Study Conclusions  - Left ventricle: The cavity size was severely dilated. Systolic   function was severely reduced. The estimated ejection fraction   was in the range of 20% to 25%. Diffuse hypokinesis. - Aortic valve: Normal appearing bioprosthetic valve with no   periprosthetic regurgitation. Valve area (VTI): 2.02 cm^2. Valve   area (Vmax): 2 cm^2. Valve area (Vmean): 2.25 cm^2. 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 Valve area by   continuity equation (using LVOT flow): 0.7 cm^2. - Left atrium: The atrium was severely   dilated. - Atrial septum: No defect or patent foramen ovale was identified. - Pulmonary arteries: PA peak pressure: 31 mm Hg (S).   Assessment and Plan  1. Chronic systolic HF/CAD/ICM - somewhat unclear history as his records are not available at this time - appears several year history of severe chronic systolic HF. He had CABG in 2017, along with prior ICD placed which sounds like St Jude CRT device.  - medical therapy limited by soft bp's, coreg recently decreased due to low bp's and dizzines, symptoms improved - continue current medical therapy - refer to device clinic. EKG today shows a-sensed vpaced rhythm  2. Valvular heart disease - history of bioprothetic AVR and MV repair in July 2017, we are requesting records - recent echo with normal AVR, mild MR - continue to moniotor  3. CKD III-IV - Cr improved with diuresis during recent admission, continue to monitor  F/u 3 months **Likely will nee colonoscopy in near future, follow progress at next visit to decide about cardiac risk      Jonathan F. Branch, M.D. 

## 2016-11-22 NOTE — Telephone Encounter (Signed)
Transition Care Management Follow-up Telephone Call   Date discharged? 11/21/2016   How have you been since you were released from the hospital? Feeling much better   Do you understand why you were in the hospital? yes   Do you understand the discharge instructions? yes   Where were you discharged to? Home   Items Reviewed:  Medications reviewed: yes  Allergies reviewed: yes  Dietary changes reviewed: yes  Referrals reviewed: yes   Functional Questionnaire:   Activities of Daily Living (ADLs):   He states they are independent in the following: ambulation, bathing and hygiene, feeding, continence, grooming, toileting and dressing States they require assistance with the following: None   Any transportation issues/concerns?: yes   Any patient concerns? no   Confirmed importance and date/time of follow-up visits scheduled yes  Provider Appointment booked with Dr. Meda Coffee on 11/30/2016 at 9:40 am.  Confirmed with patient if condition begins to worsen call PCP or go to the ER.  Patient was given the office number and encouraged to call back with question or concerns.  : yes

## 2016-11-30 ENCOUNTER — Encounter: Payer: Self-pay | Admitting: Internal Medicine

## 2016-11-30 ENCOUNTER — Ambulatory Visit (INDEPENDENT_AMBULATORY_CARE_PROVIDER_SITE_OTHER): Payer: Medicare Other | Admitting: Family Medicine

## 2016-11-30 ENCOUNTER — Ambulatory Visit (INDEPENDENT_AMBULATORY_CARE_PROVIDER_SITE_OTHER): Payer: Medicare Other | Admitting: Internal Medicine

## 2016-11-30 ENCOUNTER — Encounter: Payer: Self-pay | Admitting: Family Medicine

## 2016-11-30 VITALS — BP 98/62 | HR 88 | Temp 97.9°F | Resp 18 | Ht 67.0 in | Wt 200.1 lb

## 2016-11-30 VITALS — BP 116/60 | HR 63 | Ht 67.0 in | Wt 198.0 lb

## 2016-11-30 DIAGNOSIS — I5022 Chronic systolic (congestive) heart failure: Secondary | ICD-10-CM

## 2016-11-30 DIAGNOSIS — I10 Essential (primary) hypertension: Secondary | ICD-10-CM | POA: Diagnosis not present

## 2016-11-30 DIAGNOSIS — I5042 Chronic combined systolic (congestive) and diastolic (congestive) heart failure: Secondary | ICD-10-CM

## 2016-11-30 DIAGNOSIS — Z95810 Presence of automatic (implantable) cardiac defibrillator: Secondary | ICD-10-CM

## 2016-11-30 DIAGNOSIS — I2510 Atherosclerotic heart disease of native coronary artery without angina pectoris: Secondary | ICD-10-CM

## 2016-11-30 DIAGNOSIS — N184 Chronic kidney disease, stage 4 (severe): Secondary | ICD-10-CM | POA: Diagnosis not present

## 2016-11-30 MED ORDER — NITROGLYCERIN 0.4 MG SL SUBL: 0.4000 mg | SUBLINGUAL_TABLET | SUBLINGUAL | 1 refills | Status: DC | PRN

## 2016-11-30 MED ORDER — CARVEDILOL 3.125 MG PO TABS: 3.1250 mg | ORAL_TABLET | Freq: Two times a day (BID) | ORAL | 3 refills | Status: DC

## 2016-11-30 NOTE — Progress Notes (Signed)
HPI Dillon Cisneros is referred today by Dr. Harl Bowie for evaluation and management  of his ICD. He is a pleasant 66 yo man with a prior AVR, severe LV dysfunction, s/p BiV ICD insertion several years ago. He has moved to [facility]. He has class 2 CHF symptoms and tends to have problems with volume overload. He denies sodium indiscretion.  Allergies  Allergen Reactions  . Penicillins Hives    Has patient had a PCN reaction causing immediate rash, facial/tongue/throat swelling, SOB or lightheadedness with hypotension: No Has patient had a PCN reaction causing severe rash involving mucus membranes or skin necrosis: No Has patient had a PCN reaction that required hospitalization: No Has patient had a PCN reaction occurring within the last 10 years: No If all of the above answers are "NO", then may proceed with Cephalosporin use.   . Nsaids     Should avoid     Current Outpatient Prescriptions  Medication Sig Dispense Refill  . allopurinol (ZYLOPRIM) 300 MG tablet Take 1 tablet (300 mg total) by mouth daily. 90 tablet 3  . aspirin EC 81 MG tablet Take 81 mg by mouth daily.    Marland Kitchen atorvastatin (LIPITOR) 40 MG tablet Take 1 tablet (40 mg total) by mouth daily at 6 PM. 90 tablet 3  . bumetanide (BUMEX) 1 MG tablet Take 1 mg by mouth 2 (two) times daily.    . carvedilol (COREG) 3.125 MG tablet Take 1 tablet (3.125 mg total) by mouth 2 (two) times daily with a meal. 180 tablet 3  . cholecalciferol (VITAMIN D) 1000 units tablet Take 2,000 Units by mouth daily.    . ferrous sulfate (IRON SUPPLEMENT) 325 (65 FE) MG tablet Take 325 mg by mouth daily with breakfast.    . Multiple Vitamin (MULTIVITAMIN) capsule Take 1 capsule by mouth daily.    . nitroGLYCERIN (NITROSTAT) 0.4 MG SL tablet Place 1 tablet (0.4 mg total) under the tongue every 5 (five) minutes as needed for chest pain. 20 tablet 1  . Probiotic Product (SOLUBLE FIBER/PROBIOTICS PO) Take by mouth.    . sacubitril-valsartan (ENTRESTO) 24-26 MG  Take 1 tablet by mouth 2 (two) times daily.    Marland Kitchen spironolactone (ALDACTONE) 25 MG tablet Take 25 mg by mouth daily.    . vitamin C (ASCORBIC ACID) 500 MG tablet Take 500 mg by mouth daily.     No current facility-administered medications for this visit.      Past Medical History:  Diagnosis Date  . Allergy    penicillin  . Arthritis    arthritis  . Blood transfusion without reported diagnosis    open heart  . Cancer ([place])    skin cancer  . CHF (congestive heart failure) ([place])   . Chronic kidney disease    stage 4 kidney failure  . Heart murmur   . Hyperlipidemia   . Hypertension   . Myocardial infarction ([place])     ROS:   All systems reviewed and negative except as noted in the HPI.   Past Surgical History:  Procedure Laterality Date  . CORONARY ARTERY BYPASS GRAFT  12/2015  . JOINT REPLACEMENT     both knees replaced  . KNEE SURGERY     bilateral  . SHOULDER SURGERY     left     Family History  Problem Relation Age of Onset  . Stroke Mother   . Heart disease Father 75  . Multiple myeloma Brother  Social History   Social History  . Marital status: Married    Spouse name: [name]  . Number of children: 0  . Years of education: 14   Occupational History  . 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 Smoking status: Former Smoker    Packs/day: 0.50    Types: Cigarettes    Start date: 06/14/1970    Quit date: 06/14/2000  . Smokeless tobacco: Never Used  . Alcohol use Yes     Comment: occas social drink  . Drug use: No  . Sexual activity: Yes    Birth control/ protection: Surgical   Other Topics Concern  . 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 Abd:  soft, positive bowel sounds, no organomegally, no rebound, no guarding Ext:  2 plus pulses, 2+ edema, no cyanosis, no clubbing Skin:  No rashes no nodules Neuro:  CN II through XII intact, motor grossly intact  EKG - none  DEVICE  Normal device function.  See PaceArt for details.   Assess/Plan: 1. Chronic systolic heart failure - his symptoms are class 2. He will continue his current meds. He is encouraged to maintain a low sodium diet. 2. ICD - his St. Jude BiV ICD demonstrates that he has some atrial lead noise. He has multiple mode switches as well.  3. CAD - he is s/p CABG. He will continue his current meds. No angina. 4. AVR - he is s/p AVR about a year ago. He has had a MV repair and it sounds to me like his MV is leaking. Consider repeating the echo in a year or two.  Mikle Bosworth.D.

## 2016-11-30 NOTE — Progress Notes (Signed)
Chief Complaint  Patient presents with  . Follow-up    hosp  new patient to me last month, seen with acute on  chronic heart failure and referred to cardiology.  He ended up in the hospital. With fluid overload prior to the visit and an updated echocardiogram documented an EF of only 20- 25%. Since being diuresed he feels better.  He saw cardiology and had medicines adjusted for low blood pressure.  He is trying to stay active.  He  Is good about daily weights and salt restriction. No chest pain or pressure.  He does have persistent dyspnea and edema.   He is due for a colonoscopy and I will clear this with his cardiologist prior to ordering. No weight loss, blood in bowels or digestive complaints. I reviewed the labs from last visit.  Everything was normal or as expected.  He hs known renal impairment with a GFR of 39 at discharge. We discussed his CBC - anemia can be expected with renal failure but the MCV is not - he admits to prior daily alcohol use, now reduced.  I reminded him that excessive alcohol can be cardiotoxic - and he needs to moderate.   Patient Active Problem List   Diagnosis Date Noted  . CHF (congestive heart failure) ([place]) 11/21/2016  . Acute systolic CHF (congestive heart failure) ([place]) 11/19/2016  . Hypotension 11/19/2016  . 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 Vitamin D deficiency 11/18/2016  . History of total bilateral knee replacement (TKR) 10/26/2016  . 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 CAD (coronary artery disease) 10/26/2016  . HTN (hypertension) 10/26/2016  . Stage 4 chronic kidney disease ([place]) 10/26/2016  . HLD (hyperlipidemia) 10/26/2016  . Chronic combined systolic and diastolic CHF (congestive heart failure) ([place]) 10/26/2016  . Osteoarthritis 10/26/2016  . H/O aortic valve replacement 10/26/2016    Outpatient Encounter Prescriptions as of 11/30/2016  Medication Sig  . allopurinol (ZYLOPRIM) 300 MG tablet Take 1 tablet (300 mg  total) by mouth daily.  Marland Kitchen aspirin EC 81 MG tablet Take 81 mg by mouth daily.  Marland Kitchen atorvastatin (LIPITOR) 40 MG tablet Take 1 tablet (40 mg total) by mouth daily at 6 PM.  . bumetanide (BUMEX) 1 MG tablet Take 1 mg by mouth 2 (two) times daily.  . carvedilol (COREG) 3.125 MG tablet Take 1 tablet (3.125 mg total) by mouth 2 (two) times daily with a meal.  . cholecalciferol (VITAMIN D) 1000 units tablet Take 2,000 Units by mouth daily.  . ferrous sulfate (IRON SUPPLEMENT) 325 (65 FE) MG tablet Take 325 mg by mouth daily with breakfast.  . Multiple Vitamin (MULTIVITAMIN) capsule Take 1 capsule by mouth daily.  . nitroGLYCERIN (NITROSTAT) 0.4 MG SL tablet Place 1 tablet (0.4 mg total) under the tongue every 5 (five) minutes as needed for chest pain.  . Probiotic Product (SOLUBLE FIBER/PROBIOTICS PO) Take by mouth.  . sacubitril-valsartan (ENTRESTO) 24-26 MG Take 1 tablet by mouth 2 (two) times daily.  Marland Kitchen spironolactone (ALDACTONE) 25 MG tablet Take 25 mg by mouth daily.  . vitamin C (ASCORBIC ACID) 500 MG tablet Take 500 mg by mouth daily.   No facility-administered encounter medications on file as of 11/30/2016.     Allergies  Allergen Reactions  . Penicillins Hives    Has patient had a PCN reaction causing immediate rash, facial/tongue/throat swelling, SOB or lightheadedness with hypotension: No Has patient had a PCN reaction causing severe rash involving mucus membranes or skin necrosis:  No Has patient had a PCN reaction that required hospitalization: No Has patient had a PCN reaction occurring within the last 10 years: No If all of the above answers are "NO", then may proceed with Cephalosporin use.   . Nsaids     Should avoid    Review of Systems  Constitutional: Positive for unexpected weight change. Negative for activity change and appetite change.  HENT: Negative for congestion and dental problem.   Eyes: Negative for photophobia and visual disturbance.  Respiratory: Positive for  shortness of breath. Negative for cough.   Cardiovascular: Positive for leg swelling. Negative for chest pain and palpitations.  Gastrointestinal: Negative for blood in stool, constipation and diarrhea.  Genitourinary: Positive for frequency. Negative for difficulty urinating.       Diuretic  Musculoskeletal: Negative for arthralgias and back pain.  Neurological: Positive for light-headedness. Negative for dizziness.       Orthostatic  Hematological: Bruises/bleeds easily.  Psychiatric/Behavioral: Negative for sleep disturbance. The patient is not nervous/anxious.     BP 98/62 (BP Location: Right Arm, Patient Position: Sitting, Cuff Size: Normal)   Pulse 88   Temp 97.9 F (36.6 C) (Temporal)   Resp 18   Ht 5\' 7"  (1.702 m)   Wt 200 lb 1.9 oz (90.8 kg)   SpO2 94%   BMI 31.34 kg/m   Physical Exam  Constitutional: He is oriented to person, place, and time. He appears well-developed and well-nourished. No distress.  HENT:  Head: Normocephalic and atraumatic.  Mouth/Throat: Oropharynx is clear and moist.  Eyes: Conjunctivae are normal. Pupils are equal, round, and reactive to light.  Neck: Normal range of motion. JVD present.  Cardiovascular: Normal rate.   Murmur heard. Frequent ectopy  Pulmonary/Chest: Effort normal. He has rales.  Abdominal: Soft. Bowel sounds are normal.  Musculoskeletal: Normal range of motion. He exhibits edema.  Neurological: He is alert and oriented to person, place, and time.  Psychiatric: He has a normal mood and affect. His behavior is normal.    ASSESSMENT/PLAN:  1. Coronary artery disease involving native coronary artery of native heart without angina pectoris No angina  2. Essential hypertension Low BP  3. Chronic renal failure, stage 4 (severe) (HCC) Will follow  4. Chronic combined systolic and diastolic CHF (congestive heart failure) (HCC) Symptomatic with low EF   Patient Instructions  Double bumex for 3 days Watch the blood  pressure See me in 3 months Call sooner for problems    [name], MD

## 2016-11-30 NOTE — Patient Instructions (Signed)
Double bumex for 3 days Watch the blood pressure See me in 3 months Call sooner for problems

## 2016-11-30 NOTE — Patient Instructions (Addendum)
  Your physician wants you to follow-up in: 1 year   Dr Knox Saliva will receive a reminder letter in the mail two months in advance. If you don't receive a letter, please call our office to schedule the follow-up appointment.       Remote monitoring is used to monitor your Pacemaker of ICD from home. This monitoring reduces the number of office visits required to check your device to one time per year. It allows [place] to keep an eye on the functioning of your device to ensure it is working properly. You are scheduled for a device check from home on 03/01/17. You may send your transmission at any time that day. If you have a wireless device, the transmission will be sent automatically. After your physician reviews your transmission, you will receive a postcard with your next transmission date.    Your physician recommends that you continue on your current medications as directed. Please refer to the Current Medication list given to you today.      Thank you for choosing [facility] !

## 2016-12-01 ENCOUNTER — Telehealth: Payer: Self-pay | Admitting: *Deleted

## 2016-12-01 MED ORDER — NITROGLYCERIN 0.4 MG SL SUBL: 0.4000 mg | SUBLINGUAL_TABLET | SUBLINGUAL | 1 refills | Status: AC | PRN

## 2016-12-01 NOTE — Telephone Encounter (Signed)
Richardson Landry from [facility] called left message stating Dr Meda Coffee has prescribed the patient nitroglycerin 7 day supply and due to the plan patient has they are unable to fill this prescription unless it is a 21 day supply. Richardson Landry stated this change can be done verbally. [phone]

## 2016-12-01 NOTE — Telephone Encounter (Signed)
[facility]

## 2017-01-26 ENCOUNTER — Telehealth: Payer: Self-pay

## 2017-01-26 NOTE — Telephone Encounter (Signed)
Called and spoke to Dillon Cisneros. Is having edema in his left leg as well as feeling sob this am. Is coming to see dr Meda Coffee tomorrow to discuss.

## 2017-01-26 NOTE — Telephone Encounter (Signed)
Pt c/o his oxygen has been running around 90 and he has been SOB and was wanting to know about possibly getting supplemental oxygen. Wants a call to discuss

## 2017-01-27 ENCOUNTER — Encounter: Payer: Self-pay | Admitting: Family Medicine

## 2017-01-27 ENCOUNTER — Ambulatory Visit (INDEPENDENT_AMBULATORY_CARE_PROVIDER_SITE_OTHER): Payer: Medicare Other | Admitting: Family Medicine

## 2017-01-27 VITALS — BP 90/70 | HR 116 | Temp 97.9°F | Resp 16 | Ht 67.0 in | Wt 196.2 lb

## 2017-01-27 DIAGNOSIS — I2510 Atherosclerotic heart disease of native coronary artery without angina pectoris: Secondary | ICD-10-CM

## 2017-01-27 DIAGNOSIS — I5042 Chronic combined systolic (congestive) and diastolic (congestive) heart failure: Secondary | ICD-10-CM | POA: Diagnosis not present

## 2017-01-27 DIAGNOSIS — N184 Chronic kidney disease, stage 4 (severe): Secondary | ICD-10-CM | POA: Diagnosis not present

## 2017-01-27 NOTE — Patient Instructions (Signed)
Double bumex for 3 d Continue to watch salt and drink plenty of water Get labs today I will call with result Call cardiology if not better by Monday

## 2017-01-27 NOTE — Progress Notes (Signed)
Chief Complaint  Patient presents with  . Shortness of Breath  . Nasal Congestion  . Foot Swelling  here for acute visit Called yesterday with increased SOB.  Told to double his diuretic and come in today He feels better today.  Has chronic CHF with his most recent EF around 20 - 25 %.   This episode seems completely unprovoked.  He is careful with salt,  No chest pain or strenuous activity.  Has increased dyspnea and ankle swelling.  His weight is up  A couple of pounds - by his measurement.  He is down 5 lbs since last visit there but he has been on a diet. He does have a cold with some sinus pressure and PND.  This caused him to not sleep well the last couple of nights.    Patient Active Problem List   Diagnosis Date Noted  . CHF (congestive heart failure) ([place]) 11/21/2016  . Acute systolic CHF (congestive heart failure) ([place]) 11/19/2016  . Hypotension 11/19/2016  . 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 Vitamin D deficiency 11/18/2016  . History of total bilateral knee replacement (TKR) 10/26/2016  . 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 CAD (coronary artery disease) 10/26/2016  . HTN (hypertension) 10/26/2016  . Stage 4 chronic kidney disease ([place]) 10/26/2016  . HLD (hyperlipidemia) 10/26/2016  . Chronic combined systolic and diastolic CHF (congestive heart failure) ([place]) 10/26/2016  . Osteoarthritis 10/26/2016  . H/O aortic valve replacement 10/26/2016    Outpatient Encounter Prescriptions as of 01/27/2017  Medication Sig  . allopurinol (ZYLOPRIM) 300 MG tablet Take 1 tablet (300 mg total) by mouth daily.  Marland Kitchen aspirin EC 81 MG tablet Take 81 mg by mouth daily.  Marland Kitchen atorvastatin (LIPITOR) 40 MG tablet Take 1 tablet (40 mg total) by mouth daily at 6 PM.  . bumetanide (BUMEX) 1 MG tablet Take 1 mg by mouth 2 (two) times daily.  . carvedilol (COREG) 3.125 MG tablet Take 1 tablet (3.125 mg total) by mouth 2 (two) times daily with a meal.  . cholecalciferol  (VITAMIN D) 1000 units tablet Take 2,000 Units by mouth daily.  . ferrous sulfate (IRON SUPPLEMENT) 325 (65 FE) MG tablet Take 325 mg by mouth daily with breakfast.  . Multiple Vitamin (MULTIVITAMIN) capsule Take 1 capsule by mouth daily.  . nitroGLYCERIN (NITROSTAT) 0.4 MG SL tablet Place 1 tablet (0.4 mg total) under the tongue every 5 (five) minutes as needed for chest pain.  . Probiotic Product (SOLUBLE FIBER/PROBIOTICS PO) Take by mouth.  . sacubitril-valsartan (ENTRESTO) 24-26 MG Take 1 tablet by mouth 2 (two) times daily.  Marland Kitchen spironolactone (ALDACTONE) 25 MG tablet Take 25 mg by mouth daily.  . vitamin C (ASCORBIC ACID) 500 MG tablet Take 500 mg by mouth daily.   No facility-administered encounter medications on file as of 01/27/2017.     Allergies  Allergen Reactions  . Penicillins Hives    Has patient had a PCN reaction causing immediate rash, facial/tongue/throat swelling, SOB or lightheadedness with hypotension: No Has patient had a PCN reaction causing severe rash involving mucus membranes or skin necrosis: No Has patient had a PCN reaction that required hospitalization: No Has patient had a PCN reaction occurring within the last 10 years: No If all of the above answers are "NO", then may proceed with Cephalosporin use.   . Nsaids     Should avoid    Review of Systems  Constitutional: Negative for activity change, appetite change and unexpected  weight change.  HENT: Positive for congestion, postnasal drip and sinus pressure. Negative for ear pain, rhinorrhea and sore throat.   Eyes: Negative for photophobia and visual disturbance.  Respiratory: Positive for shortness of breath. Negative for cough.   Cardiovascular: Positive for leg swelling. Negative for chest pain and palpitations.  Gastrointestinal: Negative for constipation and diarrhea.  Genitourinary: Positive for frequency. Negative for difficulty urinating.       On bumex  Neurological: Positive for  light-headedness.       BP is low, tries not to stand quickly  Hematological: Negative for adenopathy. Does not bruise/bleed easily.  Psychiatric/Behavioral: Positive for sleep disturbance. The patient is not nervous/anxious.     BP 90/70 (BP Location: Left Arm, Patient Position: Sitting, Cuff Size: Normal)   Pulse (!) 116   Temp 97.9 F (36.6 C) (Other (Comment))   Resp 16   Ht 5\' 7"  (1.702 m)   Wt 196 lb 4 oz (89 kg)   SpO2 93%   BMI 30.74 kg/m   Physical Exam  Constitutional: He is oriented to person, place, and time. He appears well-developed and well-nourished. No distress.  HENT:  Head: Normocephalic and atraumatic.  Right Ear: External ear normal.  Left Ear: External ear normal.  Nose: Nose normal.  Mouth/Throat: Oropharynx is clear and moist.  Eyes: Pupils are equal, round, and reactive to light. Conjunctivae are normal.  Neck: Normal range of motion.  Cardiovascular: Regular rhythm.   Murmur heard. Tachycardia, soft SEM  Pulmonary/Chest: Effort normal. He has rales.  Abdominal: Soft. Bowel sounds are normal.  Musculoskeletal: He exhibits edema.  2+ pitting edema  Lymphadenopathy:    He has no cervical adenopathy.  Neurological: He is alert and oriented to person, place, and time.  Psychiatric: He has a normal mood and affect. His behavior is normal.    ASSESSMENT/PLAN:  1. Coronary artery disease involving native coronary artery of native heart without angina pectoris No chest pain or pressure 2. Chronic renal failure, stage 4 (severe) (HCC) Last GFR 39 3. Chronic combined systolic and diastolic CHF (congestive heart failure) (HCC) - BASIC METABOLIC PANEL WITH GFR Mild decompensation without clear reason.  Patient Instructions  Double bumex for 3 d Continue to watch salt and drink plenty of water Get labs today I will call with result Call cardiology if not better by Monday   [name], MD

## 2017-01-28 ENCOUNTER — Encounter: Payer: Self-pay | Admitting: Family Medicine

## 2017-01-28 LAB — BASIC METABOLIC PANEL WITH GFR
BUN: 60 mg/dL — ABNORMAL HIGH (ref 7–25)
CO2: 18 mmol/L — ABNORMAL LOW (ref 20–32)
Calcium: 9.1 mg/dL (ref 8.6–10.3)
Chloride: 105 mmol/L (ref 98–110)
Creat: 1.88 mg/dL — ABNORMAL HIGH (ref 0.70–1.25)
GFR, EST NON AFRICAN AMERICAN: 36 mL/min — AB (ref >=60)
GFR, Est African American: 41 mL/min — ABNORMAL LOW (ref >=60)
Glucose, Bld: 105 mg/dL — ABNORMAL HIGH (ref 65–99)
POTASSIUM: 4.5 mmol/L (ref 3.5–5.3)
SODIUM: 137 mmol/L (ref 135–146)

## 2017-02-02 ENCOUNTER — Other Ambulatory Visit: Payer: Self-pay | Admitting: Family Medicine

## 2017-02-02 MED ORDER — SPIRONOLACTONE 25 MG PO TABS: 25.0000 mg | ORAL_TABLET | Freq: Every day | ORAL | 3 refills | Status: DC

## 2017-02-02 MED ORDER — SACUBITRIL-VALSARTAN 24-26 MG PO TABS: 1.0000 | ORAL_TABLET | Freq: Two times a day (BID) | ORAL | 3 refills | Status: AC

## 2017-02-23 ENCOUNTER — Telehealth: Payer: Self-pay

## 2017-02-23 NOTE — Telephone Encounter (Signed)
Left message for patient to call regarding Heart Failure research study screening. [phone]

## 2017-02-25 ENCOUNTER — Telehealth: Payer: Self-pay

## 2017-02-25 NOTE — Telephone Encounter (Signed)
Spoke to patient regarding [facility] Heart Failure Study and possibly seeing Dr. Aundra Dubin in [facility]. Patient states he continues to suffer with profound fatigue. He states he sees Dr. Harl Bowie next week and he will as his opinion about coming to HF Clinic. Very pleasant. Message sent to Dr. Harl Bowie to inform him of the study and Dr. Claris Gladden request.

## 2017-02-28 ENCOUNTER — Encounter: Payer: Self-pay | Admitting: Cardiology

## 2017-02-28 ENCOUNTER — Ambulatory Visit (INDEPENDENT_AMBULATORY_CARE_PROVIDER_SITE_OTHER): Payer: Medicare Other | Admitting: Cardiology

## 2017-02-28 VITALS — BP 100/70 | HR 114 | Ht 67.0 in | Wt 200.8 lb

## 2017-02-28 DIAGNOSIS — I5022 Chronic systolic (congestive) heart failure: Secondary | ICD-10-CM

## 2017-02-28 DIAGNOSIS — I2510 Atherosclerotic heart disease of native coronary artery without angina pectoris: Secondary | ICD-10-CM | POA: Diagnosis not present

## 2017-02-28 DIAGNOSIS — I38 Endocarditis, valve unspecified: Secondary | ICD-10-CM | POA: Diagnosis not present

## 2017-02-28 DIAGNOSIS — N184 Chronic kidney disease, stage 4 (severe): Secondary | ICD-10-CM | POA: Diagnosis not present

## 2017-02-28 NOTE — Progress Notes (Signed)
Clinical Summary Mr. Bail is a 66 y.o.male seen today for follow up of the following medical problems.    1. CAD/ICM/Chronic systolic HF - reports long history of severe systolic HF. Has been evaluted at multiple medical centers over the years - history of CABG with bioprosthetic AVR and MV repair in July 2017 at [facility] in [place], [place].  - he reports ICD first placed in 2007, upgrated to a "3 wire device" in June 2015 at [facility] in [place], [place]. He received a St Jude device, has not had checked in some time.  - echo 11/2016 LVEF 20-25%   -  Some increased DOE. Increased LE edema since last visit - compliant with meds. Home weights are stable around 196 lbs.  - limiting sodium intake, avoiding NSAIDs  2. AVR - history of bioprosthetic AVR July 2017, along with MV repair - echo 11/2015 shows normal AVR function.   3. CKD III- IV   Past Medical History:  Diagnosis Date  . Allergy    penicillin  . Arthritis    arthritis  . Blood transfusion without reported diagnosis    open heart  . Cancer ([place])    skin cancer  . CHF (congestive heart failure) ([place])   . Chronic kidney disease    stage 4 kidney failure  . Heart murmur   . Hyperlipidemia   . Hypertension   . Myocardial infarction (HCC)      Allergies  Allergen Reactions  . Penicillins Hives    Has patient had a PCN reaction causing immediate rash, facial/tongue/throat swelling, SOB or lightheadedness with hypotension: No Has patient had a PCN reaction causing severe rash involving mucus membranes or skin necrosis: No Has patient had a PCN reaction that required hospitalization: No Has patient had a PCN reaction occurring within the last 10 years: No If all of the above answers are "NO", then may proceed with Cephalosporin use.   . Nsaids     Should avoid     Current Outpatient Prescriptions  Medication Sig Dispense Refill  . allopurinol (ZYLOPRIM) 300 MG tablet Take 1  tablet (300 mg total) by mouth daily. 90 tablet 3  . aspirin EC 81 MG tablet Take 81 mg by mouth daily.    Marland Kitchen atorvastatin (LIPITOR) 40 MG tablet Take 1 tablet (40 mg total) by mouth daily at 6 PM. 90 tablet 3  . bumetanide (BUMEX) 1 MG tablet Take 1 mg by mouth 2 (two) times daily.    . carvedilol (COREG) 3.125 MG tablet Take 1 tablet (3.125 mg total) by mouth 2 (two) times daily with a meal. 180 tablet 3  . cholecalciferol (VITAMIN D) 1000 units tablet Take 2,000 Units by mouth daily.    . ferrous sulfate (IRON SUPPLEMENT) 325 (65 FE) MG tablet Take 325 mg by mouth daily with breakfast.    . Multiple Vitamin (MULTIVITAMIN) capsule Take 1 capsule by mouth daily.    . nitroGLYCERIN (NITROSTAT) 0.4 MG SL tablet Place 1 tablet (0.4 mg total) under the tongue every 5 (five) minutes as needed for chest pain. 60 tablet 1  . Probiotic Product (SOLUBLE FIBER/PROBIOTICS PO) Take by mouth.    . sacubitril-valsartan (ENTRESTO) 24-26 MG Take 1 tablet by mouth 2 (two) times daily. 180 tablet 3  . spironolactone (ALDACTONE) 25 MG tablet Take 1 tablet (25 mg total) by mouth daily. 90 tablet 3  . vitamin C (ASCORBIC ACID) 500 MG tablet Take 500 mg by mouth  daily.     No current facility-administered medications for this visit.      Past Surgical History:  Procedure Laterality Date  . CORONARY ARTERY BYPASS GRAFT  12/2015  . JOINT REPLACEMENT     both knees replaced  . KNEE SURGERY     bilateral  . SHOULDER SURGERY     left     Allergies  Allergen Reactions  . Penicillins Hives    Has patient had a PCN reaction causing immediate rash, facial/tongue/throat swelling, SOB or lightheadedness with hypotension: No Has patient had a PCN reaction causing severe rash involving mucus membranes or skin necrosis: No Has patient had a PCN reaction that required hospitalization: No Has patient had a PCN reaction occurring within the last 10 years: No If all of the above answers are "NO", then may proceed with  Cephalosporin use.   . Nsaids     Should avoid      Family History  Problem Relation Age of Onset  . Stroke Mother   . Heart disease Father 51  . Multiple myeloma Brother      Social History Mr. Nielson reports that he quit smoking about 16 years ago. His smoking use included Cigarettes. He started smoking about 46 years ago. He smoked 0.50 packs per day. He has never used smokeless tobacco. Mr. Derousse reports that he drinks alcohol.   Review of Systems CONSTITUTIONAL: No weight loss, fever, chills, weakness or fatigue.  HEENT: Eyes: No visual loss, blurred vision, double vision or yellow sclerae.No hearing loss, sneezing, congestion, runny nose or sore throat.  SKIN: No rash or itching.  CARDIOVASCULAR: per hpi RESPIRATORY: per hpi GASTROINTESTINAL: No anorexia, nausea, vomiting or diarrhea. No abdominal pain or blood.  GENITOURINARY: No burning on urination, no polyuria NEUROLOGICAL: No headache, dizziness, syncope, paralysis, ataxia, numbness or tingling in the extremities. No change in bowel or bladder control.  MUSCULOSKELETAL: No muscle, back pain, joint pain or stiffness.  LYMPHATICS: No enlarged nodes. No history of splenectomy.  PSYCHIATRIC: No history of depression or anxiety.  ENDOCRINOLOGIC: No reports of sweating, cold or heat intolerance. No polyuria or polydipsia.  Marland Kitchen   Physical Examination Vitals:   02/28/17 0809  BP: 100/70  Pulse: (!) 114  SpO2: 97%   Filed Weights   02/28/17 0809  Weight: 200 lb 12.8 oz (91.1 kg)    Gen: resting comfortably, no acute distress HEENT: no scleral icterus, pupils equal round and reactive, no palptable cervical adenopathy,  CV: NTZ,0/0 systolic murmur rusb, no jvd Resp: Clear to auscultation bilaterally GI: abdomen is soft, non-tender, non-distended, normal bowel sounds, no hepatosplenomegaly MSK: extremities are warm, 1+ bilateral edema Skin: warm, no rash Neuro:  no focal deficits Psych: appropriate  affect   Diagnostic Studies 11/2016 echo Study Conclusions  - Left ventricle: The cavity size was severely dilated. Systolic function was severely reduced. The estimated ejection fraction was in the range of 20% to 25%. Diffuse hypokinesis. - Aortic valve: Normal appearing bioprosthetic valve with no periprosthetic regurgitation. Valve area (VTI): 2.02 cm^2. Valve area (Vmax): 2 cm^2. Valve area (Vmean): 2.25 cm^2. 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 Valve area by continuity equation (using LVOT flow): 0.7 cm^2. - Left atrium: The atrium was severely dilated. - Atrial septum: No defect or patent foramen ovale was identified. - Pulmonary arteries: PA peak pressure: 31 mm Hg (S).    Assessment and Plan   1. Chronic systolic HF/CAD/ICM - appears several year history of severe chronic  systolic HF. He had CABG in 2017, along with prior ICD placed with St Jude CRT device.  - medical therapy limited by soft bp's, coreg recently decreased due to low bp's and dizzines, symptoms improved. Also limited due to CKD.  - recent increase in LE edema and SOB. He will take bumex 26m in the AM and 136min the evening x 3 days, then back to 60m33mid.   2. Valvular heart disease - history of bioprothetic AVR and MV repair in July 2017 - recent echo with normal AVR, mild MR.MV gradient by mistake at 53 mmHg. However on review the mean gradient across the MV is 7 mmHg.  - continue to monitor  3. CKD III-IV - Cr improved with diuresis during recent admission, continue to monitor    Likely will need colonoscopy in near future, follow progress at next visit to decide about cardiac risk   Patient has been contacted about possibly enrolling into the GALACTIC HF study. We will refer to CHF clinic to not only discuss the study but help manage his chronic systolic HF.     JonArnoldo Lenis.D.

## 2017-02-28 NOTE — Patient Instructions (Signed)
Medication Instructions:  Increase Bumex to 2 mg in the morning, 1 mg in the evening for the next 3 days- then go back top 1 mg in the morning & 1 mg in the evening   Labwork: none  Testing/Procedures: none  Follow-Up: Your physician recommends that you schedule a follow-up appointment in: 3 months    Any Other Special Instructions Will Be Listed Below (If Applicable).     If you need a refill on your cardiac medications before your next appointment, please call your pharmacy.

## 2017-03-01 ENCOUNTER — Ambulatory Visit (INDEPENDENT_AMBULATORY_CARE_PROVIDER_SITE_OTHER): Payer: Medicare Other | Admitting: *Deleted

## 2017-03-01 DIAGNOSIS — Z95810 Presence of automatic (implantable) cardiac defibrillator: Secondary | ICD-10-CM

## 2017-03-01 DIAGNOSIS — I5022 Chronic systolic (congestive) heart failure: Secondary | ICD-10-CM | POA: Diagnosis not present

## 2017-03-01 NOTE — Progress Notes (Signed)
Remote ICD transmission.   

## 2017-03-02 ENCOUNTER — Encounter: Payer: Self-pay | Admitting: Family Medicine

## 2017-03-02 ENCOUNTER — Telehealth: Payer: Self-pay

## 2017-03-02 ENCOUNTER — Ambulatory Visit (INDEPENDENT_AMBULATORY_CARE_PROVIDER_SITE_OTHER): Payer: Medicare Other | Admitting: Family Medicine

## 2017-03-02 VITALS — BP 92/68 | HR 92 | Temp 97.1°F | Resp 16 | Ht 67.0 in | Wt 198.1 lb

## 2017-03-02 DIAGNOSIS — I10 Essential (primary) hypertension: Secondary | ICD-10-CM | POA: Diagnosis not present

## 2017-03-02 DIAGNOSIS — I5042 Chronic combined systolic (congestive) and diastolic (congestive) heart failure: Secondary | ICD-10-CM | POA: Diagnosis not present

## 2017-03-02 DIAGNOSIS — N184 Chronic kidney disease, stage 4 (severe): Secondary | ICD-10-CM | POA: Diagnosis not present

## 2017-03-02 DIAGNOSIS — Z23 Encounter for immunization: Secondary | ICD-10-CM | POA: Diagnosis not present

## 2017-03-02 DIAGNOSIS — I509 Heart failure, unspecified: Secondary | ICD-10-CM

## 2017-03-02 NOTE — Telephone Encounter (Signed)
-----   Message from Arnoldo Lenis, MD sent at 03/02/2017 12:30 PM EDT ----- Regarding: FW: Galactic Heart Failure Study Let patient know I looked over the study and think the medication looks promising, and would be worth considering for him. I think it best he discuss with Dr Aundra Dubin who is running the study, and also is one of our CHF specialists. I think it would be worth it to see Dr Aundra Dubin in consultation to evaluate the patients heart failure and also discuss the trial, please make referral  Zandra Abts MD   ----- Message ----- From: Marlana Salvage, RN Sent: 02/25/2017   8:33 AM To: Arnoldo Lenis, MD Subject: Galactic Heart Failure Study                   Hi Dr. Harl Bowie, I spoke to [name] this morning of behalf of Dr. Loralie Champagne regarding the [facility] Failure Study. Dr. Aundra Dubin is the PI of the study, which consist of  Omecamtiv Mecarbil vs. Placebo. Dr. Aundra Dubin also ask that I offer the patient an appointment to see him. Mr. Stuck is very pleasant and states he continues to struggle with profound fatigue as he can not walk down his driveway and back.   Mr. Kitagawa states he see you next week and will ask your opinion about seeing Dr. Aundra Dubin. He feels he sees a lot of doctors and is unsure he should add another to the mix.   If you want to know more about the study, I can email you the protocol.   Thank you, Desmond Dike MSN, RN

## 2017-03-02 NOTE — Progress Notes (Signed)
Chief Complaint  Patient presents with  . Follow-up    3 month   Stable heart failure Sees Dr Harl Bowie in cardiology Recently had to increase bumex for 3 d due to fluid overload, diet changes when traveling.  Now home and limiting salt again Activity limited by his dyspnea.  Must pace himself and rest frequently.  No chest pain or palpitations.  No recent lightheadedness. BP is low, chronic renal failure - diuresis is a balancing act.  He is educated and compliant with daily weight and salt restriction. Due for immunization updates Due for colonoscopy but not yet cleared by cardiology   Patient Active Problem List   Diagnosis Date Noted  . CHF (congestive heart failure) ([place]) 11/21/2016  . Acute systolic CHF (congestive heart failure) ([place]) 11/19/2016  . Hypotension 11/19/2016  . 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 Vitamin D deficiency 11/18/2016  . History of total bilateral knee replacement (TKR) 10/26/2016  . 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 CAD (coronary artery disease) 10/26/2016  . HTN (hypertension) 10/26/2016  . Stage 4 chronic kidney disease ([place]) 10/26/2016  . HLD (hyperlipidemia) 10/26/2016  . Chronic combined systolic and diastolic CHF (congestive heart failure) ([place]) 10/26/2016  . Osteoarthritis 10/26/2016  . H/O aortic valve replacement 10/26/2016    Outpatient Encounter Prescriptions as of 03/02/2017  Medication Sig  . allopurinol (ZYLOPRIM) 300 MG tablet Take 1 tablet (300 mg total) by mouth daily.  Marland Kitchen aspirin EC 81 MG tablet Take 81 mg by mouth daily.  Marland Kitchen atorvastatin (LIPITOR) 40 MG tablet Take 1 tablet (40 mg total) by mouth daily at 6 PM.  . bumetanide (BUMEX) 1 MG tablet Take 1 mg by mouth 2 (two) times daily.  . carvedilol (COREG) 3.125 MG tablet Take 1 tablet (3.125 mg total) by mouth 2 (two) times daily with a meal.  . cholecalciferol (VITAMIN D) 1000 units tablet Take 2,000 Units by mouth daily.  . ferrous sulfate (IRON  SUPPLEMENT) 325 (65 FE) MG tablet Take 325 mg by mouth daily with breakfast.  . Multiple Vitamin (MULTIVITAMIN) capsule Take 1 capsule by mouth daily.  . nitroGLYCERIN (NITROSTAT) 0.4 MG SL tablet Place 1 tablet (0.4 mg total) under the tongue every 5 (five) minutes as needed for chest pain.  . Probiotic Product (SOLUBLE FIBER/PROBIOTICS PO) Take by mouth.  . sacubitril-valsartan (ENTRESTO) 24-26 MG Take 1 tablet by mouth 2 (two) times daily.  Marland Kitchen spironolactone (ALDACTONE) 25 MG tablet Take 1 tablet (25 mg total) by mouth daily.  . vitamin C (ASCORBIC ACID) 500 MG tablet Take 500 mg by mouth daily.   No facility-administered encounter medications on file as of 03/02/2017.     Allergies  Allergen Reactions  . Penicillins Hives    Has patient had a PCN reaction causing immediate rash, facial/tongue/throat swelling, SOB or lightheadedness with hypotension: No Has patient had a PCN reaction causing severe rash involving mucus membranes or skin necrosis: No Has patient had a PCN reaction that required hospitalization: No Has patient had a PCN reaction occurring within the last 10 years: No If all of the above answers are "NO", then may proceed with Cephalosporin use.   . Nsaids     Should avoid    Review of Systems  Constitutional: Negative for activity change, appetite change and unexpected weight change.  HENT: Negative for congestion and dental problem.   Eyes: Negative for photophobia and visual disturbance.  Respiratory: Positive for shortness of breath. Negative for cough.  Cardiovascular: Positive for leg swelling. Negative for chest pain and palpitations.  Gastrointestinal: Negative for blood in stool, constipation and diarrhea.  Genitourinary: Positive for frequency. Negative for difficulty urinating.       Diuretic  Musculoskeletal: Negative for arthralgias and back pain.  Allergic/Immunologic: Negative for environmental allergies and food allergies.  Neurological: Negative for  dizziness and light-headedness.  Hematological: Bruises/bleeds easily.  Psychiatric/Behavioral: Negative for sleep disturbance. The patient is not nervous/anxious.     BP 92/68 (BP Location: Right Arm, Patient Position: Sitting, Cuff Size: Normal)   Pulse 92   Temp (!) 97.1 F (36.2 C) (Temporal)   Resp 16   Ht 5\' 7"  (1.702 m)   Wt 198 lb 1.3 oz (89.8 kg)   SpO2 96%   BMI 31.02 kg/m   Physical Exam  Constitutional: He is oriented to person, place, and time. He appears well-developed and well-nourished. No distress.  HENT:  Head: Normocephalic and atraumatic.  Right Ear: External ear normal.  Left Ear: External ear normal.  Nose: Nose normal.  Mouth/Throat: Oropharynx is clear and moist.  Eyes: Pupils are equal, round, and reactive to light. Conjunctivae are normal.  Neck: Normal range of motion.  Cardiovascular: Regular rhythm.   Murmur heard. Soft SEM, infrequent ectopy  Pulmonary/Chest: Effort normal. He has no rales.  Abdominal: Soft. Bowel sounds are normal.  Musculoskeletal: He exhibits edema.  2+ pitting edema  Lymphadenopathy:    He has no cervical adenopathy.  Neurological: He is alert and oriented to person, place, and time.  Psychiatric: He has a normal mood and affect. His behavior is normal.    ASSESSMENT/PLAN:  1. Need for influenza vaccination - Flu Vaccine QUAD 36+ mos IM  2. Need for pneumococcal vaccination - Pneumococcal conjugate vaccine 13-valent IM  3. Chronic renal failure, stage 4 (severe) (HCC)   4. Essential hypertension   5. Chronic combined systolic and diastolic CHF (congestive heart failure) [facility])    Patient Instructions  Call for problems or medicine refills See me in 6 months   [name], MD

## 2017-03-02 NOTE — Patient Instructions (Addendum)
Call for problems or medicine refills See me in 6 months

## 2017-03-02 NOTE — Telephone Encounter (Signed)
Spoke with pt, referral placed.  

## 2017-03-03 ENCOUNTER — Encounter: Payer: Self-pay | Admitting: Cardiology

## 2017-03-10 DIAGNOSIS — Z006 Encounter for examination for normal comparison and control in clinical research program: Secondary | ICD-10-CM

## 2017-03-11 ENCOUNTER — Telehealth: Payer: Self-pay

## 2017-03-11 NOTE — Progress Notes (Signed)
Patient present with his wife to get information on Galactic Heart Failure study, which is a double blind, randomized, placebo controlled, multicenter study to assess the efficacy and safety of Oecamtiv Mecarbil. Marland Kitchen Extensive conversation with both regarding Omecamtiv Mecarbil and how the goal of the study is to lower adverse cardiac outcomes through improving contractility in heart failure. Reviewed consent and questions answered. Patient states he will read consent tonight and Microbiologist with any questions. Very pleasant couple.

## 2017-03-11 NOTE — Telephone Encounter (Signed)
Returned call to patient and he does want to screen for the [facility] HF Study. Appt given for Oct 2nd 11am. [employer] called for transportation-study provided.

## 2017-03-15 ENCOUNTER — Encounter: Payer: Self-pay | Admitting: *Deleted

## 2017-03-15 DIAGNOSIS — Z006 Encounter for examination for normal comparison and control in clinical research program: Secondary | ICD-10-CM

## 2017-03-15 NOTE — Progress Notes (Signed)
Galactic Heart Failure ResearchInformed Consent  Subject Name: Dillon Cisneros  Subject met inclusion and exclusion criteria. The informed consent form, study requirements and expectations were reviewed with the subject and questions and concerns were addressed prior to the signing of the consent form. The subject verbalized understanding of the trail requirements. The subject agreed to participate in the [place] and signed the informed consent. The informed consent was obtained prior to performance of any protocol-specific procedures for the subject. A copy of the signed informed consent was given to the subject.  Duncan Dull, RN 03/15/2017

## 2017-03-17 ENCOUNTER — Telehealth: Payer: Self-pay | Admitting: *Deleted

## 2017-03-17 ENCOUNTER — Other Ambulatory Visit: Payer: Self-pay

## 2017-03-17 NOTE — Telephone Encounter (Signed)
Labs were drawn on Dillon Cisneros for the [facility] Failure Study and K+ resulted at 5.5 with the range of 3.5-5.2 from [facility].  The PI of the study, Dr. Aundra Dubin, gave the order to stop the spironolactone.  Dr. Harl Bowie made aware.  Study results faxed to Dr. Nelly Laurence office.    Spoke with the subject on the phone.  Discussed potassium results, renal function, and spironolactone discontinuation.  Educated subject on low potassium diet.  In addition, spoke with the subject about scheduling an appointment in the Heart Failure Clinic.  Dillon Cisneros declined at this time and will discuss further during his Research appointment.    Subject qualified for [facility] Failure Study.  Appointment scheduled for Randomization/Day1 next week.  Will repeat labs at that time.

## 2017-03-18 ENCOUNTER — Encounter: Payer: Self-pay | Admitting: *Deleted

## 2017-03-22 ENCOUNTER — Encounter: Payer: Self-pay | Admitting: *Deleted

## 2017-03-22 DIAGNOSIS — Z006 Encounter for examination for normal comparison and control in clinical research program: Secondary | ICD-10-CM

## 2017-03-22 NOTE — Progress Notes (Addendum)
RESEARCH ENCOUNTER  Patient ID: Dillon Cisneros  DOB: [birth year]  Dillon Cisneros presented to the Cardiovascular Research Clinic for the Day 1/Randomization visit of the [facility].  No signs/symptoms of ACS since the last visit. IP dispensed and education provided on medication regimen, returning IP/packaging, and holding the morning dose on the morning of research appointments. Subject verbalized understanding. Vitals, weight, ECG, questionnaires, and labs obtained.   Education printout provided on hyperkalemia.  Will call subject with potassium results when available.    Blood pressure 100/60, pulse 98, resp. rate 16, weight 191 lb 3.2 oz (86.7 kg), SpO2 97 %.  Patient will follow up with Research Clinic in 2 weeks.

## 2017-03-24 ENCOUNTER — Telehealth: Payer: Self-pay | Admitting: *Deleted

## 2017-03-24 ENCOUNTER — Other Ambulatory Visit: Payer: Self-pay | Admitting: *Deleted

## 2017-03-24 MED ORDER — STUDY - INVESTIGATIONAL MEDICATION: 1.0000 | Freq: Two times a day (BID) | 99 refills | Status: AC

## 2017-03-24 NOTE — Telephone Encounter (Signed)
Labs resulted for [facility] Heart Failure Research Day 1/Randomization Visit.  Potassium WNL at 4.7 and GFR improved at 39.  Notified Mr. Lemmerman of lab results via phone.  PI McLean aware.

## 2017-03-25 ENCOUNTER — Encounter: Payer: Self-pay | Admitting: *Deleted

## 2017-03-29 LAB — CUP PACEART REMOTE DEVICE CHECK
Battery Remaining Longevity: 41 mo
Brady Statistic AP VP Percent: 1 %
Brady Statistic AS VP Percent: 95 %
Brady Statistic AS VS Percent: 4.6 %
Brady Statistic RA Percent Paced: 1 %
Date Time Interrogation Session: 20180918121519
HighPow Impedance: 33 Ohm
HighPow Impedance: 33 Ohm
Implantable Lead Implant Date: 20100810
Implantable Lead Implant Date: 20100810
Implantable Lead Location: 753862
Lead Channel Impedance Value: 260 Ohm
Lead Channel Pacing Threshold Amplitude: 0.625 V
Lead Channel Pacing Threshold Amplitude: 1.125 V
Lead Channel Pacing Threshold Pulse Width: 0.5 ms
Lead Channel Pacing Threshold Pulse Width: 0.5 ms
Lead Channel Sensing Intrinsic Amplitude: 1.4 mV
Lead Channel Setting Pacing Amplitude: 2 V
Lead Channel Setting Sensing Sensitivity: 0.5 mV
MDC IDC LEAD IMPLANT DT: 20150529
MDC IDC LEAD LOCATION: 753859
MDC IDC LEAD LOCATION: 753860
MDC IDC MSMT BATTERY REMAINING PERCENTAGE: 52 %
MDC IDC MSMT BATTERY VOLTAGE: 2.92 V
MDC IDC MSMT LEADCHNL LV IMPEDANCE VALUE: 530 Ohm
MDC IDC MSMT LEADCHNL RA PACING THRESHOLD AMPLITUDE: 1.25 V
MDC IDC MSMT LEADCHNL RA PACING THRESHOLD PULSEWIDTH: 0.5 ms
MDC IDC MSMT LEADCHNL RV IMPEDANCE VALUE: 550 Ohm
MDC IDC MSMT LEADCHNL RV SENSING INTR AMPL: 11.3 mV
MDC IDC PG IMPLANT DT: 20150529
MDC IDC PG SERIAL: 7200912
MDC IDC SET LEADCHNL LV PACING AMPLITUDE: 2 V
MDC IDC SET LEADCHNL LV PACING PULSEWIDTH: 0.5 ms
MDC IDC SET LEADCHNL RV PACING AMPLITUDE: 2.125
MDC IDC SET LEADCHNL RV PACING PULSEWIDTH: 0.5 ms
MDC IDC STAT BRADY AP VS PERCENT: 1 %

## 2017-04-05 ENCOUNTER — Ambulatory Visit (HOSPITAL_COMMUNITY)
Admission: RE | Admit: 2017-04-05 | Discharge: 2017-04-05 | Disposition: A | Discharge status: Home / Self Care | Payer: Medicare Other | Source: Ambulatory Visit | Attending: Cardiology | Admitting: Cardiology

## 2017-04-05 ENCOUNTER — Encounter: Payer: Self-pay | Admitting: *Deleted

## 2017-04-05 ENCOUNTER — Encounter (HOSPITAL_COMMUNITY): Payer: Self-pay | Admitting: Cardiology

## 2017-04-05 VITALS — BP 120/80 | HR 111 | Ht 67.0 in | Wt 199.0 lb

## 2017-04-05 DIAGNOSIS — N183 Chronic kidney disease, stage 3 (moderate): Secondary | ICD-10-CM | POA: Insufficient documentation

## 2017-04-05 DIAGNOSIS — N184 Chronic kidney disease, stage 4 (severe): Secondary | ICD-10-CM

## 2017-04-05 DIAGNOSIS — Z85828 Personal history of other malignant neoplasm of skin: Secondary | ICD-10-CM | POA: Diagnosis not present

## 2017-04-05 DIAGNOSIS — M109 Gout, unspecified: Secondary | ICD-10-CM | POA: Insufficient documentation

## 2017-04-05 DIAGNOSIS — I5042 Chronic combined systolic (congestive) and diastolic (congestive) heart failure: Secondary | ICD-10-CM | POA: Diagnosis not present

## 2017-04-05 DIAGNOSIS — I5022 Chronic systolic (congestive) heart failure: Secondary | ICD-10-CM | POA: Diagnosis not present

## 2017-04-05 DIAGNOSIS — I130 Hypertensive heart and chronic kidney disease with heart failure and stage 1 through stage 4 chronic kidney disease, or unspecified chronic kidney disease: Secondary | ICD-10-CM | POA: Diagnosis not present

## 2017-04-05 DIAGNOSIS — Z823 Family history of stroke: Secondary | ICD-10-CM | POA: Insufficient documentation

## 2017-04-05 DIAGNOSIS — I2510 Atherosclerotic heart disease of native coronary artery without angina pectoris: Secondary | ICD-10-CM

## 2017-04-05 DIAGNOSIS — Z807 Family history of other malignant neoplasms of lymphoid, hematopoietic and related tissues: Secondary | ICD-10-CM | POA: Insufficient documentation

## 2017-04-05 DIAGNOSIS — I255 Ischemic cardiomyopathy: Secondary | ICD-10-CM | POA: Diagnosis not present

## 2017-04-05 DIAGNOSIS — E785 Hyperlipidemia, unspecified: Secondary | ICD-10-CM | POA: Insufficient documentation

## 2017-04-05 DIAGNOSIS — Z7982 Long term (current) use of aspirin: Secondary | ICD-10-CM | POA: Insufficient documentation

## 2017-04-05 DIAGNOSIS — Z006 Encounter for examination for normal comparison and control in clinical research program: Secondary | ICD-10-CM

## 2017-04-05 DIAGNOSIS — Z79899 Other long term (current) drug therapy: Secondary | ICD-10-CM | POA: Insufficient documentation

## 2017-04-05 DIAGNOSIS — I340 Nonrheumatic mitral (valve) insufficiency: Secondary | ICD-10-CM | POA: Diagnosis not present

## 2017-04-05 DIAGNOSIS — Z951 Presence of aortocoronary bypass graft: Secondary | ICD-10-CM | POA: Diagnosis not present

## 2017-04-05 DIAGNOSIS — Z8249 Family history of ischemic heart disease and other diseases of the circulatory system: Secondary | ICD-10-CM | POA: Diagnosis not present

## 2017-04-05 DIAGNOSIS — E875 Hyperkalemia: Secondary | ICD-10-CM | POA: Diagnosis not present

## 2017-04-05 MED ORDER — CARVEDILOL 6.25 MG PO TABS: 6.2500 mg | ORAL_TABLET | Freq: Two times a day (BID) | ORAL | 3 refills | Status: DC

## 2017-04-05 MED ORDER — BUMETANIDE 1 MG PO TABS: ORAL_TABLET | ORAL | 3 refills | Status: AC

## 2017-04-05 MED ORDER — BUMETANIDE 1 MG PO TABS: 1.0000 mg | ORAL_TABLET | Freq: Two times a day (BID) | ORAL | 3 refills | Status: DC

## 2017-04-05 NOTE — Patient Instructions (Addendum)
Increase Carvedilol 6.25 mg (1 tab), twice daily  For 2 days take Bumex 2 mg (2 tabs) in AM, then 1 mg (1 tab) in PM   Then alternate1 mg (1 tab), twice a day with 2 mg (2 tabs) in AM, then 1 mg (1 tab) in PM alternating with 1 mg (1 tab), twice a day.   You have been referred to Nephrology in [place] ( they will call you)   Your physician recommends that you return for lab work in: 10 days  Your physician recommends that you schedule a follow-up appointment in: 3 weeks with Aundra Dubin, MD

## 2017-04-05 NOTE — Progress Notes (Signed)
PCP: Dr. Meda Coffee Cardiology: Dr. Harl Bowie HF Cardiology: Dr. Aundra Dubin  66 yo with history of CAD, aortic and mitral valve disease, ischemic cardiomyopathy, and CKD stage 3 was referred by Dr. Harl Bowie for evaluation of CHF.  Patient moved to [place] in 5/18.  Prior to that, he and his wife traveled the country for several years in their motorhome.  He had cardiac care in [place] and in [place].  He has a history of ischemic cardiomyopathy, last echo in 6/18 showed a severely dilated LV with EF 20-25%. He had a St Jude CRT-D device placed in 6/15 in [place].  In 7/17 in [place] in [place], he had CABG + bioprosthetic AVR + MV repair.  The valves looked stable on last echo.    No chest pain.  He has stable dyspnea walking up hills.  Generally does ok on flat ground or 1 flight stairs.  No lightheadedness/falls.  Heart rate is chronically elevated.  Home BP appears to run in the 100s-110s generally.  No orthopnea/PND.    Labs (10/18): K 5.5 => 5.2 => 4.7, K 2.5 => 2.4 => 1.76, proBNP 4403  ECG (personally reviewed): sinus tachy at 114, BiV-paced  St Jude device interrogation: Corevue with decreasing impedance.  Only 86% BiV pacing due to mode switching.  She has sinus tachycardia.  No atrial flutter or atrial tachycardia noted but initially though present due to noise from faulty A lead.  - Adjusted device to allow increased BiV pacing percentage.   PMH: 1. CAD: s/p CABG in 7/17.  This was done in [place] at time of AVR and MV repair.  No details on grafts.  2. Chronic systolic CHF: Ischemic cardiomyopathy.  - St Jude CRT-D (implanted 6/15 in [place]).  - Echo (6/18): severely dilated LV, EF 20-25%, bioprosthetic aortic valve looks ok, s/p MV repair with mild MR, mean gradient 7 mmHg.  3. CKD: Stage 3.  4. HTN 5. Hyperlipidemia. 6. H/o skin cancer.  7. Bioprosthetic aortic valve: 7/17 in [place].  Stable on 6/18 echo.  8. Mitral regurgitation: s/p MV repair in 7/17.  Stable on 6/18  echo.  9. Gout  SH: Married, now lives in [place] but prior to that had lived a number of years in his motorhome.  2 kids.  Retired.  No smoking, rare ETOH.   Family History  Problem Relation Age of Onset  . Stroke Mother   . Heart disease Father 71  . Multiple myeloma Brother    ROS: All systems reviewed and negative except as per HPI.   Current Outpatient Prescriptions  Medication Sig Dispense Refill  . allopurinol (ZYLOPRIM) 300 MG tablet Take 1 tablet (300 mg total) by mouth daily. 90 tablet 3  . aspirin EC 81 MG tablet Take 81 mg by mouth daily.    Marland Kitchen atorvastatin (LIPITOR) 40 MG tablet Take 1 tablet (40 mg total) by mouth daily at 6 PM. 90 tablet 3  . bumetanide (BUMEX) 1 MG tablet Take 2 mg (2 Tabs) in the AM and 1 mg (1 tab) in PM, alternating with 1 mg (1 tab), twice a day. 225 tablet 3  . carvedilol (COREG) 6.25 MG tablet Take 1 tablet (6.25 mg total) by mouth 2 (two) times daily with a meal. 60 tablet 3  . cholecalciferol (VITAMIN D) 1000 units tablet Take 2,000 Units by mouth daily.    . ferrous sulfate (IRON SUPPLEMENT) 325 (65 FE) MG tablet Take 325 mg by mouth daily with breakfast.    .  Investigational - Study Medication Take 1 tablet by mouth 2 (two) times daily. Study name: [facility] Heart Failure Research Study Additional study details: Omecamtiv Mecarbil or Placebo 1 each PRN  . Multiple Vitamin (MULTIVITAMIN) capsule Take 1 capsule by mouth daily.    . nitroGLYCERIN (NITROSTAT) 0.4 MG SL tablet Place 1 tablet (0.4 mg total) under the tongue every 5 (five) minutes as needed for chest pain. 60 tablet 1  . Probiotic Product (SOLUBLE FIBER/PROBIOTICS PO) Take by mouth.    . sacubitril-valsartan (ENTRESTO) 24-26 MG Take 1 tablet by mouth 2 (two) times daily. 180 tablet 3  . vitamin C (ASCORBIC ACID) 500 MG tablet Take 500 mg by mouth daily.     No current facility-administered medications for this encounter.    BP 120/80 (BP Location: Left Arm, Patient Position:  Sitting, Cuff Size: Normal)   Pulse (!) 111   Ht 5' 7" (1.702 m)   Wt 199 lb (90.3 kg)   SpO2 93%   BMI 31.17 kg/m  General: NAD Neck: JVP 8-9 cm, no thyromegaly or thyroid nodule.  Lungs: Clear to auscultation bilaterally with normal respiratory effort. CV: Nondisplaced PMI.  Heart tachy, regular S1/S2, no S3/S4, 2/6 SEM RUSB.  1+ edema 1/3 to knees bilaterally.  No carotid bruit.  Normal pedal pulses.  Abdomen: Soft, nontender, no hepatosplenomegaly, no distention.  Skin: Intact without lesions or rashes.  Neurologic: Alert and oriented x 3.  Psych: Normal affect. Extremities: No clubbing or cyanosis.  HEENT: Normal.   Assessment/Plan: 1. Chronic systolic CHF: Ischemic cardiomyopathy.  EF 20-25% on 6/18 echo.  St Jude CRT-D device.  NYHA class II-III, stable.  He is volume overloaded on exam.  - Increase bumetanide to 2 mg qam/1 mg qpm x 2 days then alternate 2 mg qam/1 mg qpm and 1 mg bid after that.  BMET 10 days.  - Gently increase Coreg to 6.25 mg bid.  Will continue increasing Coreg as tolerated.  If we cannot increase further, will add on ivabradine to help lower HR (ST in the 100s-110s).  - Continue Entresto 24/26 bid. Will continue especially now that creatinine has come down to 1.76.  - Off spironolactone with recent hyperkalemia.  Will leave off for now.   - Continue [facility] study drug.  - Device interrogation today was concerning for atrial flutter versus atrial tachy, but further evaluation suggests that this was atrial lead noise and that the patient is likely in sinus tachycardia.  No need for anticoagulation.  He was only BiV pacing 86% of the time due to mode switching.  We were able to reprogram the device to promote increased BiV pacing.  Will reassess device at next appointment. Also, he will likely need atrial lead change-out before long.  2. CAD: S/p CABG.  No chest pain.   - Continue ASA 81.  - Continue atorvastatin 40 daily.  3. Bioprosthetic aortic valve:  Stable on last echo.  4. S/p mitral valve repair: Stable on last echo.  5. CKD: Stage 3.  Creatinine most recently back down to 1.76.  Repeat BMET 10 days after increasing bumetanide.  Will refer to nephrology in [place].   Loralie Champagne 04/05/2017

## 2017-04-05 NOTE — Progress Notes (Addendum)
RESEARCH ENCOUNTER  Patient ID: Dillon Cisneros  DOB: 1950/11/20  [name] presented to the Cardiovascular Research Clinic for the Week 2 visit of the [facility].  No signs/symptoms of ACS since the last visit. Subject 100% compliant with IP.    Patient will follow up with Research Clinic in 2 weeks.

## 2017-04-15 ENCOUNTER — Other Ambulatory Visit (HOSPITAL_COMMUNITY): Payer: Self-pay | Admitting: Cardiology

## 2017-04-19 ENCOUNTER — Other Ambulatory Visit: Payer: Self-pay | Admitting: *Deleted

## 2017-04-19 ENCOUNTER — Encounter: Payer: Self-pay | Admitting: *Deleted

## 2017-04-19 DIAGNOSIS — Z006 Encounter for examination for normal comparison and control in clinical research program: Secondary | ICD-10-CM

## 2017-04-19 MED ORDER — CARVEDILOL 3.125 MG PO TABS: 3.1250 mg | ORAL_TABLET | Freq: Two times a day (BID) | ORAL | 3 refills | Status: DC

## 2017-04-19 MED ORDER — CARVEDILOL 3.125 MG PO TABS: 3.1250 mg | ORAL_TABLET | Freq: Two times a day (BID) | ORAL | 3 refills | Status: AC

## 2017-04-19 NOTE — Progress Notes (Signed)
RESEARCH ENCOUNTER  Patient ID: Dillon Cisneros  DOB: [birth year]  [name] presented to the Cardiovascular Research Clinic for the Week 4 visit of the [facility].  No signs/symptoms of ACS since the last visit. Subject compliant with IP, IP returned, and additional IP dispensed.     Manual BP recoding 84/58 right and 88/60 left. Subject asymptomatic. He checks his BP daily at home. This morning his BP was in the low 100s prior to taking his morning meds. Discussed with PI McLean; will decrease Coreg to 3.125 BID and have subject continue monitoring BP at home.  Subject verbalized understanding.  Patient will follow up with the [facility] in 2 weeks.

## 2017-04-25 ENCOUNTER — Other Ambulatory Visit: Payer: Self-pay | Admitting: Cardiology

## 2017-04-26 ENCOUNTER — Encounter (HOSPITAL_COMMUNITY): Payer: Self-pay | Admitting: Cardiology

## 2017-04-26 ENCOUNTER — Other Ambulatory Visit: Payer: Self-pay

## 2017-04-26 ENCOUNTER — Telehealth: Payer: Self-pay

## 2017-04-26 ENCOUNTER — Ambulatory Visit (HOSPITAL_COMMUNITY)
Admission: RE | Admit: 2017-04-26 | Discharge: 2017-04-26 | Disposition: A | Discharge status: Home / Self Care | Payer: Medicare Other | Source: Ambulatory Visit | Attending: Cardiology | Admitting: Cardiology

## 2017-04-26 VITALS — BP 112/73 | HR 109 | Wt 195.0 lb

## 2017-04-26 DIAGNOSIS — I130 Hypertensive heart and chronic kidney disease with heart failure and stage 1 through stage 4 chronic kidney disease, or unspecified chronic kidney disease: Secondary | ICD-10-CM | POA: Insufficient documentation

## 2017-04-26 DIAGNOSIS — Z85828 Personal history of other malignant neoplasm of skin: Secondary | ICD-10-CM | POA: Diagnosis not present

## 2017-04-26 DIAGNOSIS — N183 Chronic kidney disease, stage 3 (moderate): Secondary | ICD-10-CM | POA: Insufficient documentation

## 2017-04-26 DIAGNOSIS — I5042 Chronic combined systolic (congestive) and diastolic (congestive) heart failure: Secondary | ICD-10-CM | POA: Diagnosis not present

## 2017-04-26 DIAGNOSIS — N184 Chronic kidney disease, stage 4 (severe): Secondary | ICD-10-CM | POA: Diagnosis not present

## 2017-04-26 DIAGNOSIS — I255 Ischemic cardiomyopathy: Secondary | ICD-10-CM | POA: Diagnosis not present

## 2017-04-26 DIAGNOSIS — Z951 Presence of aortocoronary bypass graft: Secondary | ICD-10-CM | POA: Diagnosis not present

## 2017-04-26 DIAGNOSIS — Z7982 Long term (current) use of aspirin: Secondary | ICD-10-CM | POA: Diagnosis not present

## 2017-04-26 DIAGNOSIS — I38 Endocarditis, valve unspecified: Secondary | ICD-10-CM | POA: Diagnosis not present

## 2017-04-26 DIAGNOSIS — I340 Nonrheumatic mitral (valve) insufficiency: Secondary | ICD-10-CM | POA: Diagnosis not present

## 2017-04-26 DIAGNOSIS — Z79899 Other long term (current) drug therapy: Secondary | ICD-10-CM | POA: Diagnosis not present

## 2017-04-26 DIAGNOSIS — E875 Hyperkalemia: Secondary | ICD-10-CM | POA: Insufficient documentation

## 2017-04-26 DIAGNOSIS — E785 Hyperlipidemia, unspecified: Secondary | ICD-10-CM | POA: Insufficient documentation

## 2017-04-26 DIAGNOSIS — M109 Gout, unspecified: Secondary | ICD-10-CM | POA: Insufficient documentation

## 2017-04-26 DIAGNOSIS — Z9889 Other specified postprocedural states: Secondary | ICD-10-CM | POA: Insufficient documentation

## 2017-04-26 DIAGNOSIS — I2510 Atherosclerotic heart disease of native coronary artery without angina pectoris: Secondary | ICD-10-CM | POA: Diagnosis not present

## 2017-04-26 DIAGNOSIS — I5022 Chronic systolic (congestive) heart failure: Secondary | ICD-10-CM | POA: Insufficient documentation

## 2017-04-26 MED ORDER — IVABRADINE HCL 5 MG PO TABS: 5.0000 mg | ORAL_TABLET | Freq: Two times a day (BID) | ORAL | 3 refills | Status: DC

## 2017-04-26 NOTE — Telephone Encounter (Signed)
[employer] called at request of patient for transportation for 05-02-17 Research Visit.

## 2017-04-26 NOTE — Patient Instructions (Signed)
Start Corlanor 5 mg (1 tab), twice a day   Your physician recommends that you schedule a follow-up appointment in: 2 months

## 2017-04-27 NOTE — Progress Notes (Signed)
PCP: Dr. Meda Coffee Cardiology: Dr. Harl Bowie HF Cardiology: Dr. Aundra Dubin  66 yo with history of CAD, aortic and mitral valve disease, ischemic cardiomyopathy, and CKD stage 3 was referred by Dr. Harl Bowie for evaluation of CHF.  Patient moved to [place] in 5/18.  Prior to that, he and his wife traveled the country for several years in their motorhome.  He had cardiac care in [place] and in [place].  He has a history of ischemic cardiomyopathy, last echo in 6/18 showed a severely dilated LV with EF 20-25%. He had a St Jude CRT-D device placed in 6/15 in [place].  In 7/17 in [place] in [place], he had CABG + bioprosthetic AVR + MV repair.  The valves looked stable on last echo.    At last appointment, bumetanide was increased due to volume overload.  I tried him on higher Coreg dose but he developed hypotensive and went back to Coreg 3.125 mg bid.    He returns for followup today.  Weight down 4 lbs.  Still has some swelling in his feet.  He walks 1/2 mile at a time on a treadmill without dyspnea.  Dyspnea walking up hills.  No chest pain.  No lightheadedness/falls.  No palpitations.     Labs (10/18): K 5.5 => 5.2 => 4.7 => 5.2, K 2.5 => 2.4 => 1.76 => 2.43, proBNP 4403 Labs (11/18): K 4.2, creatinine 2.13.   St Jude device interrogation: Corevue with stable thoracic impedance.  90% BiV pacing.  This is higher than at last appointment when device was adjusted.   PMH: 1. CAD: s/p CABG in 7/17.  This was done in [place] at time of AVR and MV repair.  No details on grafts.  2. Chronic systolic CHF: Ischemic cardiomyopathy.  - St Jude CRT-D (implanted 6/15 in [place]).  - Echo (6/18): severely dilated LV, EF 20-25%, bioprosthetic aortic valve looks ok, s/p MV repair with mild MR, mean gradient 7 mmHg.  3. CKD: Stage 3.  4. HTN 5. Hyperlipidemia. 6. H/o skin cancer.  7. Bioprosthetic aortic valve: 7/17 in [place].  Stable on 6/18 echo.  8. Mitral regurgitation: s/p MV repair in 7/17.   Stable on 6/18 echo.  9. Gout  SH: Married, now lives in [place] but prior to that had lived a number of years in his motorhome.  2 kids.  Retired.  No smoking, rare ETOH.   Family History  Problem Relation Age of Onset  . Stroke Mother   . Heart disease Father 90  . Multiple myeloma Brother    ROS: All systems reviewed and negative except as per HPI.   Current Outpatient Medications  Medication Sig Dispense Refill  . allopurinol (ZYLOPRIM) 300 MG tablet Take 1 tablet (300 mg total) by mouth daily. 90 tablet 3  . aspirin EC 81 MG tablet Take 81 mg by mouth daily.    Marland Kitchen atorvastatin (LIPITOR) 40 MG tablet Take 1 tablet (40 mg total) by mouth daily at 6 PM. 90 tablet 3  . bumetanide (BUMEX) 1 MG tablet Take 2 mg (2 Tabs) in the AM and 1 mg (1 tab) in PM, alternating with 1 mg (1 tab), twice a day. 225 tablet 3  . carvedilol (COREG) 3.125 MG tablet Take 1 tablet (3.125 mg total) 2 (two) times daily by mouth. 180 tablet 3  . cholecalciferol (VITAMIN D) 1000 units tablet Take 2,000 Units by mouth daily.    . ferrous sulfate (IRON SUPPLEMENT) 325 (65 FE) MG tablet Take 325 mg  by mouth daily with breakfast.    . Investigational - Study Medication Take 1 tablet by mouth 2 (two) times daily. Study name: [facility] Heart Failure Research Study Additional study details: Omecamtiv Mecarbil or Placebo 1 each PRN  . Multiple Vitamin (MULTIVITAMIN) capsule Take 1 capsule by mouth daily.    . nitroGLYCERIN (NITROSTAT) 0.4 MG SL tablet Place 1 tablet (0.4 mg total) under the tongue every 5 (five) minutes as needed for chest pain. 60 tablet 1  . Probiotic Product (SOLUBLE FIBER/PROBIOTICS PO) Take by mouth.    . sacubitril-valsartan (ENTRESTO) 24-26 MG Take 1 tablet by mouth 2 (two) times daily. 180 tablet 3  . vitamin C (ASCORBIC ACID) 500 MG tablet Take 500 mg by mouth daily.    . ivabradine (CORLANOR) 5 MG TABS tablet Take 1 tablet (5 mg total) 2 (two) times daily with a meal by mouth. 180 tablet 3    No current facility-administered medications for this encounter.    BP 112/73   Pulse (!) 109   Wt 195 lb (88.5 kg)   SpO2 98%   BMI 30.54 kg/m  General: NAD Neck: JVP 8 cm, no thyromegaly or thyroid nodule.  Lungs: Dry crackles at bases.  CV: Nondisplaced PMI.  Heart regular S1/S2, no S3/S4, 2/6 HSM LLSB.  Trace ankle edema.  No carotid bruit.  Normal pedal pulses.  Abdomen: Soft, nontender, no hepatosplenomegaly, no distention.  Skin: Intact without lesions or rashes.  Neurologic: Alert and oriented x 3.  Psych: Normal affect. Extremities: No clubbing or cyanosis.  HEENT: Normal.   Assessment/Plan: 1. Chronic systolic CHF: Ischemic cardiomyopathy.  EF 20-25% on 6/18 echo.  St Jude CRT-D device.  NYHA class II.  Volume status improved compared to prior appointment.  - Continue bumetanide 2 mg qam/1 mg qpm alternating with 1 mg bid after that.   - He did not tolerate increasing Coreg due to hypotension.  Still with mild sinus tachycardia.  I will try him on Corlanor 5 mg bid.  - Continue Entresto 24/26 bid. - Off spironolactone with recent hyperkalemia.  Will leave off for now.   - Continue [facility] study drug.  - After device adjustment at last appointment, BiV pacing is up to 90% (still not ideal).  Suspect he is still mode switching with sinus tachycardia.  Corlanor may help improve BiV pacing percentage.   2. CAD: S/p CABG.  No chest pain.   - Continue ASA 81.  - Continue atorvastatin 40 daily.  3. Bioprosthetic aortic valve: Stable on last echo.  4. S/p mitral valve repair: Stable on last echo.  5. CKD: Stage 3.  He has been referred to nephrology in [place].   Dillon Cisneros 04/27/2017

## 2017-05-02 ENCOUNTER — Encounter: Payer: Medicare Other | Admitting: *Deleted

## 2017-05-02 ENCOUNTER — Telehealth (HOSPITAL_COMMUNITY): Payer: Self-pay

## 2017-05-02 DIAGNOSIS — Z006 Encounter for examination for normal comparison and control in clinical research program: Secondary | ICD-10-CM

## 2017-05-02 NOTE — Progress Notes (Signed)
RESEARCH ENCOUNTER  Patient ID: Dillon Cisneros  DOB: 1950/08/13  [name] presented to the Cardiovascular Research Clinic for the Week 6 visit of the [facility].  No signs/symptoms of ACS since the last visit. Subject states compliance with IP.  Subject due for lab tests including PK level; however, subject took morning dose of IP.  Subjected stated that he forgot and is willing to come back to the clinic tomorrow for lab draw.  Educated on holding morning dose of IP on days with Research appt.  Subject verbalized understanding.     Subject states that he is walking 1.5 miles daily and is feeling the best he has in a while.  He is sleeping better and was able to work in the yard yesterday.    Patient will follow up with Research Clinic tomorrow for labs.

## 2017-05-02 NOTE — Telephone Encounter (Signed)
Left VM   Larey Dresser, MD  Chrismon, Rollen Sox, RN  Cc: Shirley Muscat, RN        Have him decrease Coreg back down to 3.125 mg bid and keep track of his home BP over the next few days.   Previous Messages    ----- Message -----  From: Duncan Dull, RN  Sent: 04/19/2017  1:07 PM  To: Larey Dresser, MD  Subject: Blood Pressure                  Dr. Aundra Dubin,  Wanted to give you the heads up that Mr. Cassaro had low BP readings during his [facility] visit today: 84/58 right and 88/60 left. He was asymptomatic. He checks his BP daily at home. This morning his BP was in the low 100s prior to taking his morning meds. I told him I would let him know if you wanted to change anything.

## 2017-05-03 ENCOUNTER — Encounter: Payer: Medicare Other | Admitting: *Deleted

## 2017-05-03 ENCOUNTER — Telehealth: Payer: Self-pay | Admitting: Cardiology

## 2017-05-03 ENCOUNTER — Telehealth (HOSPITAL_COMMUNITY): Payer: Self-pay

## 2017-05-03 DIAGNOSIS — Z006 Encounter for examination for normal comparison and control in clinical research program: Secondary | ICD-10-CM

## 2017-05-03 MED ORDER — AZITHROMYCIN 500 MG PO TABS
ORAL_TABLET | ORAL | 0 refills | Status: DC
Start: 2017-05-03 — End: 2017-05-03

## 2017-05-03 MED ORDER — AZITHROMYCIN 500 MG PO TABS: ORAL_TABLET | ORAL | 0 refills | Status: DC

## 2017-05-03 NOTE — Telephone Encounter (Signed)
E-scribed to [facility], pt aware

## 2017-05-03 NOTE — Telephone Encounter (Signed)
Pt came into clinic today. Pt reports taking that Coreg 3.125 mg BID and is currently logging his BP. Pt states it has been an improvement in hypotension.

## 2017-05-03 NOTE — Progress Notes (Signed)
RESEARCH ENCOUNTER  Patient ID: Dillon Cisneros  DOB: Nov 09, 1950  Dillon Cisneros presented to the Cardiovascular Research Clinic for the GALACTIC-HF Week 6 labs.  Subject held morning dose of IP for labwork.    Patient will follow up with Research Clinic in 2 weeks.

## 2017-05-03 NOTE — Telephone Encounter (Signed)
Pt is having dental work done, Dr. Harl Bowie wanted him to take Azithromycin 500mg , he's probably going to have around 10 dental procedures done. Please have it sent to his pharmacy [facility]

## 2017-05-03 NOTE — Telephone Encounter (Signed)
Patient does need the antibiotic, he would need to take 30 min to 1 hour before any dental procedure. If he has more than one planned then will need additional doses prior to each procedure.   J Gilberto Stanforth MD

## 2017-05-03 NOTE — Telephone Encounter (Signed)
I will check with Dr Harl Bowie to see if patient needs antibiotic

## 2017-05-04 ENCOUNTER — Other Ambulatory Visit (HOSPITAL_COMMUNITY): Payer: Self-pay | Admitting: Cardiology

## 2017-05-09 ENCOUNTER — Emergency Department (HOSPITAL_COMMUNITY): Payer: Medicare Other

## 2017-05-09 ENCOUNTER — Other Ambulatory Visit: Payer: Self-pay

## 2017-05-09 ENCOUNTER — Encounter: Payer: Self-pay | Admitting: Family Medicine

## 2017-05-09 ENCOUNTER — Inpatient Hospital Stay (HOSPITAL_COMMUNITY)
Admission: EM | Admit: 2017-05-09 | Discharge: 2017-05-20 | DRG: 291 | Disposition: A | Discharge status: Home / Self Care | Payer: Medicare Other | Attending: Cardiology | Admitting: Cardiology

## 2017-05-09 ENCOUNTER — Telehealth: Payer: Self-pay | Admitting: Family Medicine

## 2017-05-09 ENCOUNTER — Telehealth (HOSPITAL_COMMUNITY): Payer: Self-pay | Admitting: *Deleted

## 2017-05-09 ENCOUNTER — Inpatient Hospital Stay (HOSPITAL_COMMUNITY): Payer: Medicare Other

## 2017-05-09 ENCOUNTER — Ambulatory Visit (INDEPENDENT_AMBULATORY_CARE_PROVIDER_SITE_OTHER): Payer: Medicare Other | Admitting: Family Medicine

## 2017-05-09 ENCOUNTER — Encounter (HOSPITAL_COMMUNITY): Payer: Self-pay | Admitting: Emergency Medicine

## 2017-05-09 VITALS — HR 116 | Temp 98.1°F | Resp 18 | Ht 67.0 in | Wt 197.1 lb

## 2017-05-09 DIAGNOSIS — J181 Lobar pneumonia, unspecified organism: Secondary | ICD-10-CM | POA: Diagnosis not present

## 2017-05-09 DIAGNOSIS — E876 Hypokalemia: Secondary | ICD-10-CM | POA: Diagnosis present

## 2017-05-09 DIAGNOSIS — Z96653 Presence of artificial knee joint, bilateral: Secondary | ICD-10-CM | POA: Diagnosis present

## 2017-05-09 DIAGNOSIS — I4891 Unspecified atrial fibrillation: Secondary | ICD-10-CM | POA: Diagnosis not present

## 2017-05-09 DIAGNOSIS — I5023 Acute on chronic systolic (congestive) heart failure: Secondary | ICD-10-CM

## 2017-05-09 DIAGNOSIS — E872 Acidosis: Secondary | ICD-10-CM | POA: Diagnosis not present

## 2017-05-09 DIAGNOSIS — I5043 Acute on chronic combined systolic (congestive) and diastolic (congestive) heart failure: Secondary | ICD-10-CM | POA: Diagnosis present

## 2017-05-09 DIAGNOSIS — I480 Paroxysmal atrial fibrillation: Secondary | ICD-10-CM | POA: Diagnosis present

## 2017-05-09 DIAGNOSIS — I959 Hypotension, unspecified: Secondary | ICD-10-CM | POA: Diagnosis present

## 2017-05-09 DIAGNOSIS — D6959 Other secondary thrombocytopenia: Secondary | ICD-10-CM | POA: Diagnosis not present

## 2017-05-09 DIAGNOSIS — Y95 Nosocomial condition: Secondary | ICD-10-CM | POA: Diagnosis not present

## 2017-05-09 DIAGNOSIS — J9601 Acute respiratory failure with hypoxia: Secondary | ICD-10-CM | POA: Diagnosis not present

## 2017-05-09 DIAGNOSIS — E871 Hypo-osmolality and hyponatremia: Secondary | ICD-10-CM | POA: Diagnosis present

## 2017-05-09 DIAGNOSIS — I2510 Atherosclerotic heart disease of native coronary artery without angina pectoris: Secondary | ICD-10-CM | POA: Diagnosis present

## 2017-05-09 DIAGNOSIS — I9589 Other hypotension: Secondary | ICD-10-CM | POA: Diagnosis not present

## 2017-05-09 DIAGNOSIS — R7989 Other specified abnormal findings of blood chemistry: Secondary | ICD-10-CM

## 2017-05-09 DIAGNOSIS — I509 Heart failure, unspecified: Secondary | ICD-10-CM

## 2017-05-09 DIAGNOSIS — I5082 Biventricular heart failure: Secondary | ICD-10-CM | POA: Diagnosis not present

## 2017-05-09 DIAGNOSIS — N183 Chronic kidney disease, stage 3 (moderate): Secondary | ICD-10-CM | POA: Diagnosis not present

## 2017-05-09 DIAGNOSIS — I10 Essential (primary) hypertension: Secondary | ICD-10-CM | POA: Diagnosis not present

## 2017-05-09 DIAGNOSIS — Z952 Presence of prosthetic heart valve: Secondary | ICD-10-CM

## 2017-05-09 DIAGNOSIS — G9340 Encephalopathy, unspecified: Secondary | ICD-10-CM | POA: Diagnosis not present

## 2017-05-09 DIAGNOSIS — E785 Hyperlipidemia, unspecified: Secondary | ICD-10-CM | POA: Diagnosis not present

## 2017-05-09 DIAGNOSIS — Z85828 Personal history of other malignant neoplasm of skin: Secondary | ICD-10-CM

## 2017-05-09 DIAGNOSIS — I5021 Acute systolic (congestive) heart failure: Secondary | ICD-10-CM | POA: Diagnosis not present

## 2017-05-09 DIAGNOSIS — M109 Gout, unspecified: Secondary | ICD-10-CM | POA: Diagnosis present

## 2017-05-09 DIAGNOSIS — I481 Persistent atrial fibrillation: Secondary | ICD-10-CM | POA: Diagnosis not present

## 2017-05-09 DIAGNOSIS — Z880 Allergy status to penicillin: Secondary | ICD-10-CM

## 2017-05-09 DIAGNOSIS — A419 Sepsis, unspecified organism: Secondary | ICD-10-CM | POA: Diagnosis not present

## 2017-05-09 DIAGNOSIS — Z79899 Other long term (current) drug therapy: Secondary | ICD-10-CM

## 2017-05-09 DIAGNOSIS — J690 Pneumonitis due to inhalation of food and vomit: Secondary | ICD-10-CM | POA: Diagnosis not present

## 2017-05-09 DIAGNOSIS — I5042 Chronic combined systolic (congestive) and diastolic (congestive) heart failure: Secondary | ICD-10-CM | POA: Diagnosis present

## 2017-05-09 DIAGNOSIS — R570 Cardiogenic shock: Secondary | ICD-10-CM | POA: Diagnosis not present

## 2017-05-09 DIAGNOSIS — R739 Hyperglycemia, unspecified: Secondary | ICD-10-CM | POA: Diagnosis present

## 2017-05-09 DIAGNOSIS — I493 Ventricular premature depolarization: Secondary | ICD-10-CM | POA: Diagnosis not present

## 2017-05-09 DIAGNOSIS — Z7982 Long term (current) use of aspirin: Secondary | ICD-10-CM

## 2017-05-09 DIAGNOSIS — Z006 Encounter for examination for normal comparison and control in clinical research program: Secondary | ICD-10-CM

## 2017-05-09 DIAGNOSIS — I484 Atypical atrial flutter: Secondary | ICD-10-CM | POA: Diagnosis not present

## 2017-05-09 DIAGNOSIS — Z515 Encounter for palliative care: Secondary | ICD-10-CM | POA: Diagnosis not present

## 2017-05-09 DIAGNOSIS — T80219A Unspecified infection due to central venous catheter, initial encounter: Secondary | ICD-10-CM

## 2017-05-09 DIAGNOSIS — D696 Thrombocytopenia, unspecified: Secondary | ICD-10-CM | POA: Diagnosis not present

## 2017-05-09 DIAGNOSIS — N184 Chronic kidney disease, stage 4 (severe): Secondary | ICD-10-CM | POA: Diagnosis present

## 2017-05-09 DIAGNOSIS — I130 Hypertensive heart and chronic kidney disease with heart failure and stage 1 through stage 4 chronic kidney disease, or unspecified chronic kidney disease: Secondary | ICD-10-CM | POA: Diagnosis present

## 2017-05-09 DIAGNOSIS — N179 Acute kidney failure, unspecified: Secondary | ICD-10-CM | POA: Diagnosis present

## 2017-05-09 DIAGNOSIS — J9602 Acute respiratory failure with hypercapnia: Secondary | ICD-10-CM | POA: Diagnosis not present

## 2017-05-09 DIAGNOSIS — J9811 Atelectasis: Secondary | ICD-10-CM | POA: Diagnosis not present

## 2017-05-09 DIAGNOSIS — B59 Pneumocystosis: Secondary | ICD-10-CM | POA: Diagnosis not present

## 2017-05-09 DIAGNOSIS — R000 Tachycardia, unspecified: Secondary | ICD-10-CM | POA: Diagnosis not present

## 2017-05-09 DIAGNOSIS — I483 Typical atrial flutter: Secondary | ICD-10-CM | POA: Diagnosis not present

## 2017-05-09 DIAGNOSIS — Z886 Allergy status to analgesic agent status: Secondary | ICD-10-CM

## 2017-05-09 DIAGNOSIS — Z95810 Presence of automatic (implantable) cardiac defibrillator: Secondary | ICD-10-CM | POA: Diagnosis not present

## 2017-05-09 DIAGNOSIS — Z4502 Encounter for adjustment and management of automatic implantable cardiac defibrillator: Secondary | ICD-10-CM

## 2017-05-09 DIAGNOSIS — J9690 Respiratory failure, unspecified, unspecified whether with hypoxia or hypercapnia: Secondary | ICD-10-CM

## 2017-05-09 DIAGNOSIS — I472 Ventricular tachycardia: Secondary | ICD-10-CM | POA: Diagnosis not present

## 2017-05-09 DIAGNOSIS — Z8249 Family history of ischemic heart disease and other diseases of the circulatory system: Secondary | ICD-10-CM

## 2017-05-09 DIAGNOSIS — Z953 Presence of xenogenic heart valve: Secondary | ICD-10-CM

## 2017-05-09 DIAGNOSIS — I4892 Unspecified atrial flutter: Secondary | ICD-10-CM | POA: Diagnosis present

## 2017-05-09 DIAGNOSIS — I255 Ischemic cardiomyopathy: Secondary | ICD-10-CM | POA: Diagnosis not present

## 2017-05-09 DIAGNOSIS — R0602 Shortness of breath: Secondary | ICD-10-CM

## 2017-05-09 DIAGNOSIS — Z951 Presence of aortocoronary bypass graft: Secondary | ICD-10-CM | POA: Diagnosis not present

## 2017-05-09 DIAGNOSIS — J9600 Acute respiratory failure, unspecified whether with hypoxia or hypercapnia: Secondary | ICD-10-CM

## 2017-05-09 DIAGNOSIS — J189 Pneumonia, unspecified organism: Secondary | ICD-10-CM | POA: Diagnosis not present

## 2017-05-09 DIAGNOSIS — R6521 Severe sepsis with septic shock: Secondary | ICD-10-CM | POA: Diagnosis not present

## 2017-05-09 DIAGNOSIS — Z87891 Personal history of nicotine dependence: Secondary | ICD-10-CM

## 2017-05-09 LAB — COMPREHENSIVE METABOLIC PANEL
ALT: 65 U/L — AB (ref 17–63)
AST: 66 U/L — AB (ref 15–41)
Albumin: 4.2 g/dL (ref 3.5–5.0)
Alkaline Phosphatase: 84 U/L (ref 38–126)
Anion gap: 13 (ref 5–15)
BILIRUBIN TOTAL: 1 mg/dL (ref 0.3–1.2)
BUN: 79 mg/dL — AB (ref 6–20)
CO2: 24 mmol/L (ref 22–32)
CREATININE: 2.73 mg/dL — AB (ref 0.61–1.24)
Calcium: 9.6 mg/dL (ref 8.9–10.3)
Chloride: 101 mmol/L (ref 101–111)
GFR calc Af Amer: 26 mL/min — ABNORMAL LOW (ref >60)
GFR, EST NON AFRICAN AMERICAN: 22 mL/min — AB (ref >60)
GLUCOSE: 115 mg/dL — AB (ref 65–99)
Potassium: 4.3 mmol/L (ref 3.5–5.1)
Sodium: 138 mmol/L (ref 135–145)
TOTAL PROTEIN: 7.1 g/dL (ref 6.5–8.1)

## 2017-05-09 LAB — CBC WITH DIFFERENTIAL/PLATELET
BASOS ABS: 0 10*3/uL (ref 0.0–0.1)
Basophils Relative: 0 %
Eosinophils Absolute: 0.1 10*3/uL (ref 0.0–0.7)
Eosinophils Relative: 1 %
HEMATOCRIT: 43.9 % (ref 39.0–52.0)
Hemoglobin: 13.4 g/dL (ref 13.0–17.0)
LYMPHS PCT: 9 %
Lymphs Abs: 0.7 10*3/uL (ref 0.7–4.0)
MCH: 32 pg (ref 26.0–34.0)
MCHC: 30.5 g/dL (ref 30.0–36.0)
MCV: 104.8 fL — AB (ref 78.0–100.0)
Monocytes Absolute: 0.9 10*3/uL (ref 0.1–1.0)
Monocytes Relative: 11 %
NEUTROS ABS: 6.8 10*3/uL (ref 1.7–7.7)
Neutrophils Relative %: 79 %
Platelets: 155 10*3/uL (ref 150–400)
RBC: 4.19 MIL/uL — AB (ref 4.22–5.81)
RDW: 16.6 % — ABNORMAL HIGH (ref 11.5–15.5)
WBC: 8.5 10*3/uL (ref 4.0–10.5)

## 2017-05-09 LAB — BRAIN NATRIURETIC PEPTIDE: B Natriuretic Peptide: 1626 pg/mL — ABNORMAL HIGH (ref 0.0–100.0)

## 2017-05-09 LAB — TROPONIN I
TROPONIN I: 0.07 ng/mL — AB (ref <0.03)
TROPONIN I: 0.07 ng/mL — AB (ref <0.03)

## 2017-05-09 MED ORDER — HEPARIN SODIUM (PORCINE) 5000 UNIT/ML IJ SOLN
5000.0000 [IU] | Freq: Three times a day (TID) | INTRAMUSCULAR | Status: DC
  Administered 2017-05-09: 5000 [IU] via SUBCUTANEOUS

## 2017-05-09 MED ORDER — AMIODARONE HCL IN DEXTROSE 360-4.14 MG/200ML-% IV SOLN
30.0000 mg/h | INTRAVENOUS | Status: DC
  Administered 2017-05-10: 30 mg/h via INTRAVENOUS
  Administered 2017-05-10 – 2017-05-14 (×13): 60 mg/h via INTRAVENOUS
  Administered 2017-05-14 – 2017-05-20 (×13): 30 mg/h via INTRAVENOUS
  Filled 2017-05-09 (×30): qty 200

## 2017-05-09 MED ORDER — DEXTROSE 5 % IV SOLN
500.0000 mg | INTRAVENOUS | Status: AC
  Administered 2017-05-10 – 2017-05-14 (×5): 500 mg via INTRAVENOUS
  Filled 2017-05-09 (×5): qty 500

## 2017-05-09 MED ORDER — DEXTROSE 5 % IV SOLN
500.0000 mg | Freq: Once | INTRAVENOUS | Status: AC
  Administered 2017-05-09: 500 mg via INTRAVENOUS
  Filled 2017-05-09: qty 500

## 2017-05-09 MED ORDER — SODIUM CHLORIDE 0.9% FLUSH: 3.0000 mL | INTRAVENOUS | Status: DC | PRN

## 2017-05-09 MED ORDER — HEPARIN (PORCINE) IN NACL 100-0.45 UNIT/ML-% IJ SOLN
1300.0000 [IU]/h | INTRAMUSCULAR | Status: DC
  Administered 2017-05-09 – 2017-05-10 (×2): 1200 [IU]/h via INTRAVENOUS
  Filled 2017-05-09 (×3): qty 250

## 2017-05-09 MED ORDER — DEXTROSE 5 % IV SOLN
1.0000 g | INTRAVENOUS | Status: DC
  Administered 2017-05-11 – 2017-05-15 (×5): 1 g via INTRAVENOUS
  Filled 2017-05-09 (×7): qty 10

## 2017-05-09 MED ORDER — ASPIRIN EC 81 MG PO TBEC
81.0000 mg | DELAYED_RELEASE_TABLET | Freq: Every day | ORAL | Status: DC
  Administered 2017-05-09 – 2017-05-19 (×11): 81 mg via ORAL
  Filled 2017-05-09 (×11): qty 1

## 2017-05-09 MED ORDER — SODIUM CHLORIDE 0.9 % IV SOLN
250.0000 mL | INTRAVENOUS | Status: DC | PRN
  Administered 2017-05-10 (×2): via INTRAVENOUS

## 2017-05-09 MED ORDER — IVABRADINE HCL 7.5 MG PO TABS
7.5000 mg | ORAL_TABLET | Freq: Two times a day (BID) | ORAL | Status: DC
  Filled 2017-05-09 (×2): qty 1

## 2017-05-09 MED ORDER — DEXTROSE 5 % IV SOLN
1.0000 g | Freq: Once | INTRAVENOUS | Status: AC
  Administered 2017-05-09: 1 g via INTRAVENOUS
  Filled 2017-05-09: qty 10

## 2017-05-09 MED ORDER — HEPARIN SODIUM (PORCINE) 5000 UNIT/ML IJ SOLN
5000.0000 [IU] | Freq: Three times a day (TID) | INTRAMUSCULAR | Status: DC
  Filled 2017-05-09: qty 1

## 2017-05-09 MED ORDER — FUROSEMIDE 10 MG/ML IJ SOLN
60.0000 mg | Freq: Two times a day (BID) | INTRAMUSCULAR | Status: DC
  Administered 2017-05-09: 60 mg via INTRAVENOUS
  Filled 2017-05-09: qty 6

## 2017-05-09 MED ORDER — ATORVASTATIN CALCIUM 40 MG PO TABS
40.0000 mg | ORAL_TABLET | Freq: Every day | ORAL | Status: DC
  Administered 2017-05-09 – 2017-05-18 (×10): 40 mg via ORAL
  Filled 2017-05-09 (×13): qty 1

## 2017-05-09 MED ORDER — SODIUM CHLORIDE 0.9% FLUSH
3.0000 mL | Freq: Two times a day (BID) | INTRAVENOUS | Status: DC
  Administered 2017-05-10 – 2017-05-14 (×6): 3 mL via INTRAVENOUS

## 2017-05-09 MED ORDER — ONDANSETRON HCL 4 MG/2ML IJ SOLN: 4.0000 mg | Freq: Four times a day (QID) | INTRAMUSCULAR | Status: DC | PRN

## 2017-05-09 MED ORDER — CARVEDILOL 3.125 MG PO TABS
3.1250 mg | ORAL_TABLET | Freq: Two times a day (BID) | ORAL | Status: DC
  Administered 2017-05-10: 3.125 mg via ORAL
  Filled 2017-05-09 (×5): qty 1

## 2017-05-09 MED ORDER — HEPARIN BOLUS VIA INFUSION
4000.0000 [IU] | Freq: Once | INTRAVENOUS | Status: AC
  Administered 2017-05-09: 4000 [IU] via INTRAVENOUS
  Filled 2017-05-09: qty 4000

## 2017-05-09 MED ORDER — ADULT MULTIVITAMIN W/MINERALS CH
1.0000 | ORAL_TABLET | Freq: Every day | ORAL | Status: DC
  Administered 2017-05-10 – 2017-05-19 (×10): 1 via ORAL
  Filled 2017-05-09 (×11): qty 1

## 2017-05-09 MED ORDER — NITROGLYCERIN 0.4 MG SL SUBL: 0.4000 mg | SUBLINGUAL_TABLET | SUBLINGUAL | Status: DC | PRN

## 2017-05-09 MED ORDER — ACETAMINOPHEN 325 MG PO TABS
650.0000 mg | ORAL_TABLET | ORAL | Status: DC | PRN
  Administered 2017-05-11 – 2017-05-18 (×10): 650 mg via ORAL
  Filled 2017-05-09 (×10): qty 2

## 2017-05-09 MED ORDER — IVABRADINE HCL 7.5 MG PO TABS: 7.5000 mg | ORAL_TABLET | Freq: Two times a day (BID) | ORAL | 3 refills | Status: DC

## 2017-05-09 MED ORDER — AMIODARONE HCL IN DEXTROSE 360-4.14 MG/200ML-% IV SOLN
60.0000 mg/h | INTRAVENOUS | Status: AC
  Administered 2017-05-09: 60 mg/h via INTRAVENOUS
  Filled 2017-05-09: qty 200

## 2017-05-09 MED ORDER — ALLOPURINOL 300 MG PO TABS
300.0000 mg | ORAL_TABLET | Freq: Every day | ORAL | Status: DC
  Administered 2017-05-10 – 2017-05-14 (×5): 300 mg via ORAL
  Filled 2017-05-09 (×8): qty 1

## 2017-05-09 NOTE — Progress Notes (Signed)
ANTICOAGULATION CONSULT NOTE - Initial Consult  Pharmacy Consult for heparin Indication: atrial fibrillation  Allergies  Allergen Reactions  . Penicillins Hives    Has patient had a PCN reaction causing immediate rash, facial/tongue/throat swelling, SOB or lightheadedness with hypotension: No Has patient had a PCN reaction causing severe rash involving mucus membranes or skin necrosis: No Has patient had a PCN reaction that required hospitalization: No Has patient had a PCN reaction occurring within the last 10 years: No If all of the above answers are "NO", then may proceed with Cephalosporin use.   . Nsaids     Should avoid    Patient Measurements: Height: 5\' 7"  (170.2 cm) Weight: 196 lb 3.4 oz (89 kg) IBW/kg (Calculated) : 66.1 Heparin Dosing Weight: 84.5kg  Vital Signs: Temp: 98.4 F (36.9 C) (11/26 2039) Temp Source: Oral (11/26 2039) BP: 93/70 (11/26 2039) Pulse Rate: 118 (11/26 2039)  Labs: Recent Labs    05/09/17 1121 05/09/17 1607  HGB 13.4  --   HCT 43.9  --   PLT 155  --   CREATININE 2.73*  --   TROPONINI 0.07* 0.07*    Estimated Creatinine Clearance: 27.6 mL/min (A) (by C-G formula based on SCr of 2.73 mg/dL (H)).   Medical History: Past Medical History:  Diagnosis Date  . Allergy    penicillin  . Anemia   . Arthritis    arthritis  . Atrial fibrillation ([place]) 12/28/2015  . Basal cell carcinoma 2011   scalp/neck/forehead  . CAD (coronary artery disease) 06/21/1999  . 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 CHF (congestive heart failure) ([place]) 06/21/1999  . Chronic kidney disease 2010   stage 4 kidney failure  . Gout   . Heart murmur   . History of blood transfusion    open heart sx  . 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 Hyperlipidemia   . Hypertension 1998  . Thrombocytopenia ([place]) 2017  . Ventricular arrhythmia 2001    Assessment: 31 YOM  transferred from [facility] with A-flutter, pharmacy is consulted to start IV heparin, possible plan for TEE-DCCV tomorrow. Pt is not on anticoagulation PTA, received sq heparin this evening prior to transfer. Noted CKD - III, current crcl ~ 30 ml/min  Goal of Therapy:  Heparin level 0.3-0.7 units/ml Monitor platelets by anticoagulation protocol: Yes   Plan:  - Heparin bolus 4000 units/hr - Heparin infusion 1200 units/hr - f/u AM heparin level and CBC - f/u plans for cardioversion.  - d/c sq heparin  Maryanna Shape, PharmD, BCPS  Clinical Pharmacist  Pager: [phone]   05/09/2017,9:19 PM

## 2017-05-09 NOTE — Telephone Encounter (Signed)
Pt was seen by Dr.Nelson today she called and spoke with Dr.McLean during her office visit with patient. Plan and medication adjustments discussed at that time.

## 2017-05-09 NOTE — ED Notes (Signed)
Report called [facility] 6E RN Rey Buendia.

## 2017-05-09 NOTE — Telephone Encounter (Signed)
Called patient, he is aware

## 2017-05-09 NOTE — ED Notes (Signed)
Pt reports last month he had his pacemaker interrogated and was told one of his leads "was trying to deteriorate". Noted JVD to R neck and pt c/o SOB, denies CP. Has had white sputum production today, felt like he was getting pneumonia.

## 2017-05-09 NOTE — ED Notes (Signed)
[employer] representative at bedside.

## 2017-05-09 NOTE — Telephone Encounter (Signed)
Patient left a message that he thinks he has pneumonia again. He is having a hard time breathing when walking around or laying flat. He  is requesting an appt, I do not see anything for today as of now. [phone]

## 2017-05-09 NOTE — H&P (Addendum)
History and Physical  Dillon Cisneros KCL:[id number] DOB: 06-10-1951 DOA: 05/09/2017  Referring physician: P[place] PCP: [name], MD   Chief Complaint: SOB  HPI: Dillon Cisneros is a 66 y.o. male with ischemic cardiomyopathy with ICD in place and EF noted to be in the 20% range presented to ED today at the direction of his heart failure team with shortness of breath for the last 2 days worsening after thanksgiving.  He had been seen at primary care doctor office and sent here after discussion with Dr. Aundra Cisneros from the heart failure clinic.  Has had a shortness of breath with a dry cough for 2 days.  There was concern for pneumonia with his symptoms of persistent white sputum production.  He denies fever and chills at this time.   The patient states this feels like this when he had pneumonia in the past.  He has chronic biventricular heart failure and chronic pacemaker.  He states he gained several pounds over Thanksgiving.  States his legs are more swollen than normal.  In particular his left leg is more swollen than baseline.   It tested negative for DVT when checked in ED today.  The patient reports that he feels weak all over.  His heart rate has been fast and he does describe them as palpitations.  He was seen by heart failure clinic in Nov 2018 and had adjustment of his medications through the heart failure clinic to try and control his heart rate and improve his pacemaker effectiveness but he has been noted in ED to be in afib/flutter which is considered new onset per ED physician.   Cardiology has been consulted to see him and they requested that he be admitted to [facility] so that he can be followed by the heart failure team.  The heart failure team was also consulted and will be seeing him.  The patient denies having chest pain but has been increasing productive white frothy sputum and SOB.    Review of Systems: All systems reviewed and apart from history of presenting illness, are  negative.  Past Medical History:  Diagnosis Date  . Allergy    penicillin  . Anemia   . Arthritis    arthritis  . Atrial fibrillation ([place]) 12/28/2015  . Basal cell carcinoma 2011   scalp/neck/forehead  . CAD (coronary artery disease) 06/21/1999  . 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 CHF (congestive heart failure) ([place]) 06/21/1999  . Chronic kidney disease 2010   stage 4 kidney failure  . Gout   . Heart murmur   . History of blood transfusion    open heart sx  . 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 Hyperlipidemia   . Hypertension 1998  . Thrombocytopenia ([place]) 2017  . Ventricular arrhythmia 2001   Past Surgical History:  Procedure Laterality Date  . CORONARY ARTERY BYPASS GRAFT  12/25/2015  . JOINT REPLACEMENT     both knees replaced  . KNEE SURGERY     bilateral  . SHOULDER SURGERY     left   Social History:  reports that he quit smoking about 16 years ago. His smoking use included cigarettes. He started smoking about 46 years ago. He smoked 0.50 packs per day. he has never used smokeless tobacco. He reports that he drinks alcohol. He reports that he does not use drugs.  Allergies  Allergen Reactions  . Penicillins Hives  Has patient had a PCN reaction causing immediate rash, facial/tongue/throat swelling, SOB or lightheadedness with hypotension: No Has patient had a PCN reaction causing severe rash involving mucus membranes or skin necrosis: No Has patient had a PCN reaction that required hospitalization: No Has patient had a PCN reaction occurring within the last 10 years: No If all of the above answers are "NO", then may proceed with Cephalosporin use.   . Nsaids     Should avoid    Family History  Problem Relation Age of Onset  . Stroke Mother   . Heart disease Father 72  . Multiple myeloma Brother     Prior to Admission medications   Medication Sig Start  Date End Date Taking? Authorizing Provider  allopurinol (ZYLOPRIM) 300 MG tablet Take 1 tablet (300 mg total) by mouth daily. 11/16/16   [name], MD  aspirin EC 81 MG tablet Take 81 mg by mouth daily.    [provider]  atorvastatin (LIPITOR) 40 MG tablet Take 1 tablet (40 mg total) by mouth daily at 6 PM. 11/16/16   [name], MD  azithromycin (ZITHROMAX) 500 MG tablet Take 500 mg 30 minutes to 1 hour prior to dental procedure Patient not taking: Reported on 05/09/2017 05/03/17   Arnoldo Lenis, MD  bumetanide (BUMEX) 1 MG tablet Take 2 mg (2 Tabs) in the AM and 1 mg (1 tab) in PM, alternating with 1 mg (1 tab), twice a day. 04/05/17   Larey Dresser, MD  carvedilol (COREG) 3.125 MG tablet Take 1 tablet (3.125 mg total) 2 (two) times daily by mouth. 04/19/17   Larey Dresser, MD  cholecalciferol (VITAMIN D) 1000 units tablet Take 2,000 Units by mouth daily.    [provider]  ferrous sulfate (IRON SUPPLEMENT) 325 (65 FE) MG tablet Take 325 mg by mouth daily with breakfast.    [provider]  Investigational - Study Medication Take 1 tablet by mouth 2 (two) times daily. Study name: Galactic Heart Failure Research Study Additional study details: Omecamtiv Mecarbil or Placebo 03/22/17   Larey Dresser, MD  ivabradine (CORLANOR) 7.5 MG TABS tablet Take 1 tablet (7.5 mg total) by mouth 2 (two) times daily with a meal. 05/09/17   Larey Dresser, MD  Multiple Vitamin (MULTIVITAMIN) capsule Take 1 capsule by mouth daily.    [provider]  nitroGLYCERIN (NITROSTAT) 0.4 MG SL tablet Place 1 tablet (0.4 mg total) under the tongue every 5 (five) minutes as needed for chest pain. 12/01/16   [name], MD  Probiotic Product (SOLUBLE FIBER/PROBIOTICS PO) Take by mouth.    [provider]  sacubitril-valsartan (ENTRESTO) 24-26 MG Take 1 tablet by mouth 2 (two) times daily. 02/02/17   [name], MD  vitamin C (ASCORBIC ACID)  500 MG tablet Take 500 mg by mouth daily.    [provider]   Physical Exam: Vitals:   05/09/17 1108 05/09/17 1200 05/09/17 1305 05/09/17 1330  BP:  96/82 90/75 (!) 83/65  Pulse:  (!) 119 65 (!) 120  Resp:  20 (!) 21 (!) 23  Temp:      TempSrc:      SpO2:  93% 93% 92%  Weight: 89.4 kg (197 lb)     Height: 5' 7"  (1.702 m)        General exam: Moderately built and nourished patient, lying comfortably supine on the gurney in no obvious distress.  Head, eyes and ENT: Nontraumatic and  normocephalic. Pupils equally reacting to light and accommodation. Oral mucosa moist.  Neck: Supple. No JVD, carotid bruit or thyromegaly.  Lymphatics: No lymphadenopathy.  Respiratory system: Clear to auscultation. No increased work of breathing.  Cardiovascular system: S1 and S2 heard, irregular rhythm. mild JVD seen, 2+ pedal edema.  Gastrointestinal system: Abdomen is nondistended, soft and nontender. Normal bowel sounds heard. No organomegaly or masses appreciated.  Central nervous system: Alert and oriented. No focal neurological deficits.  Extremities: Symmetric 5 x 5 power. Peripheral pulses symmetrically felt. 2+ pitting edema bilateral LEs L>R.   Skin: No rashes or acute findings.  Musculoskeletal system: Negative exam.  Psychiatry: Pleasant and cooperative.  Labs on Admission:  Basic Metabolic Panel: Recent Labs  Lab 05/09/17 1121  NA 138  K 4.3  CL 101  CO2 24  GLUCOSE 115*  BUN 79*  CREATININE 2.73*  CALCIUM 9.6   Liver Function Tests: Recent Labs  Lab 05/09/17 1121  AST 66*  ALT 65*  ALKPHOS 84  BILITOT 1.0  PROT 7.1  ALBUMIN 4.2   No results for input(s): LIPASE, AMYLASE in the last 168 hours. No results for input(s): AMMONIA in the last 168 hours. CBC: Recent Labs  Lab 05/09/17 1121  WBC 8.5  NEUTROABS 6.8  HGB 13.4  HCT 43.9  MCV 104.8*  PLT 155   Cardiac Enzymes: Recent Labs  Lab 05/09/17 1121  TROPONINI 0.07*    BNP (last 3  results) No results for input(s): PROBNP in the last 8760 hours. CBG: No results for input(s): GLUCAP in the last 168 hours.  Radiological Exams on Admission: Dg Chest 2 View  Addendum Date: 05/09/2017   ADDENDUM REPORT: 05/09/2017 13:19 ADDENDUM: The impression should state: Cardiomegaly with small bilateral pleural effusions and mild pulmonary venous congestion. New right base airspace disease, suspicious for infection or aspiration. Electronically Signed   By: Abigail Miyamoto M.D.   On: 05/09/2017 13:19   Result Date: 05/09/2017 CLINICAL DATA:  COUGH AND SOB X A FEW DAYS, HEART SURGERY IN 2016, HTN, CHF, FORMER SMOKER SINCE 2002 EXAM: CHEST  2 VIEW COMPARISON:  11/19/2016 FINDINGS: Pacer/AICD device. Midline trachea. Moderate cardiomegaly. Small bilateral pleural effusions. No pneumothorax. Pulmonary interstitial prominence and indistinctness. New right base airspace disease. Suspect similar left base subsegmental atelectasis or scarring. Cardiomegaly with small bilateral pleural effusions and mild pulmonary venous congestion. New right base airspace disease, suspicious for infection or aspiration. IMPRESSION: No active cardiopulmonary disease. Electronically Signed: By: Abigail Miyamoto M.D. On: 05/09/2017 12:44   US Venous Img Lower Unilateral Left  Result Date: 05/09/2017 CLINICAL DATA:  Left lower extremity pain and edema for the past 2 days. Former smoker. History of skin cancer and bilateral total knee replacements. Evaluate for DVT. EXAM: LEFT LOWER EXTREMITY VENOUS DOPPLER ULTRASOUND TECHNIQUE: Gray-scale sonography with graded compression, as well as color Doppler and duplex ultrasound were performed to evaluate the lower extremity deep venous systems from the level of the common femoral vein and including the common femoral, femoral, profunda femoral, popliteal and calf veins including the posterior tibial, peroneal and gastrocnemius veins when visible. The superficial great saphenous vein was  also interrogated. Spectral Doppler was utilized to evaluate flow at rest and with distal augmentation maneuvers in the common femoral, femoral and popliteal veins. COMPARISON:  None. FINDINGS: Contralateral Common Femoral Vein: Respiratory phasicity is normal and symmetric with the symptomatic side. No evidence of thrombus. Normal compressibility. Common Femoral Vein: No evidence of thrombus. Normal compressibility, respiratory phasicity and response to  augmentation. Saphenofemoral Junction: No evidence of thrombus. Normal compressibility and flow on color Doppler imaging. Profunda Femoral Vein: No evidence of thrombus. Normal compressibility and flow on color Doppler imaging. Femoral Vein: No evidence of thrombus. Normal compressibility, respiratory phasicity and response to augmentation. Popliteal Vein: No evidence of thrombus. Normal compressibility, respiratory phasicity and response to augmentation. Calf Veins: No evidence of thrombus. Normal compressibility and flow on color Doppler imaging. Superficial Great Saphenous Vein: No evidence of thrombus. Normal compressibility. Venous Reflux:  None. Other Findings: Pulsatile flow was demonstrated throughout the entirety the interrogated portions of the left lower extremity venous system as well as the contralateral right common femoral vein. IMPRESSION: 1. No evidence of DVT within either lower extremity. 2. Pulsatile flow demonstrated throughout the interrogated portions of the left lower extremity venous system as well as the contralateral right common femoral vein, nonspecific though could be seen in the setting of right-sided heart failure/congestive heart failure. Clinical correlation is advised. Electronically Signed   By: Sandi Mariscal M.D.   On: 05/09/2017 13:16   Assessment/Plan Principal Problem:   Pneumonia Active Problems:   Hx of CABG   CAD (coronary artery disease)   HTN (hypertension)   Stage 4 chronic kidney disease (HCC)   HLD  (hyperlipidemia)   Chronic combined systolic and diastolic CHF (congestive heart failure) (HCC)   H/O aortic valve replacement   ICD (implantable cardioverter-defibrillator) in place   Acute systolic CHF (congestive heart failure) (HCC)   Hypotension   Research study patient   Atrial fibrillation (HCC)   Elevated brain natriuretic peptide (BNP) level   Aspiration pneumonia ([place])  1. Community Acquired Pneumonia - blood culture and sputum cultures have been ordered,  IV ceftriaxone/azithromycin ordered  Aspiration precautions ordered, elevate head of bed.  Continue supportive care with oxygen as needed.  Incentive spirometry and aspiration precautions ordered.  Follow clinical course and adjust therapy as needed.   2. Acute on chronic combined systolic and diastolic heart failure - Pt notes a weight gain of several pounds in last several days but noted that his weight was 195 when he was seen 11/20 at HF clinic and now it is 197 #.  He will be admitted for IV diuresis.  I have asked for the cardiology service to weigh in on his diuresis in the setting of his chronic hypotension and CKD stage 4.   3. Atrial Flutter - this appears to be new onset - He has been on coreg and taking it and was started recently on corlanor.  I will defer further management to the cardiology service.   4. Chronic hypotension - He has very soft to low BPs but has been totally asymptomatic from this and noted to have a chronically low blood pressure.  Will follow closely.  He is being admitted to stepdown unit because of his low BP in the setting of ongoing diuresis.   5. Stage 4 CKD - will need to renally dose medications and follow closely in the setting of ongoing diuresis.  Consider nephrology consult if any decline in renal function.   6. CAD s/p CABG - stable, no chest pain symptoms right now, checking troponins for completeness given his SOB symptoms and will have cardiology see the patient in consultation.   Resume  aspirin and atorvastatin.  7. Gout - resume home allopurinol daily.  He is high risk for acute exacerbation in the setting of inpatient diuresis.   8. LLE edema - [place] of left Lower extremity negative  for DVT.    DVT Prophylaxis: heparin Code Status: Full   Family Communication:   Disposition Plan: Tx to [facility] hospital   Time spent: 48 mins  Irwin Brakeman, MD [facility] Pager [phone]  If 7PM-7AM, please contact night-coverage [email or web address] Password [email or web address] 05/09/2017, 2:19 PM

## 2017-05-09 NOTE — ED Notes (Signed)
Date and time results received: 05/09/17 12:45 PM   Test: Troponin  Critical Value: 0.07  Name of Provider Notified:   Orders Received? Or Actions Taken?: no new orders at this time.

## 2017-05-09 NOTE — ED Notes (Signed)
Report given to Larene Beach, RN on [facility].

## 2017-05-09 NOTE — ED Notes (Signed)
ICD reading transmitting at this time according to device.

## 2017-05-09 NOTE — Telephone Encounter (Signed)
Patient called c/o heart rate running in the 100s at rest. Patient started corlanor 5mg  bid. Per Dr.McLean increase Corlanor to 7.5mg  bid.  I called patient to go over medication change but no answer left VM requesting patient to return my call.

## 2017-05-09 NOTE — Patient Instructions (Signed)
I spoke to Dr Peri Maris Lean at the heart failure clinic He wants you to go to the ER  Need pacemaker interrogation Need chest x ray Need blood work

## 2017-05-09 NOTE — ED Notes (Addendum)
Pt able to eat per MD Alvino Chapel, given lunch tray.

## 2017-05-09 NOTE — Progress Notes (Signed)
Chief Complaint  Patient presents with  . Shortness of Breath    x 2 days   Patient called this morning asking to be seen.  He states he had increased shortness of breath for 2-3 days.  He had a cough.  Yellow sputum.  Very tired.  Chills but no fever.  He is concerned because last time he felt this poorly, he had pneumonia.  He has chronic shortness of breath from his heart failure.  He is under the care of Dr. Algernon Huxley in the heart failure clinic.  He is on an experimental drug.  He had been feeling well until this weekend.  No change in medicines.  No change in salt.  He weighs himself daily.  He has not had more than 2 pounds weight fluctuation recently.  No chest pain.  He does have a pounding sensation in his heart, and his heart beat has been quite rapid.  His wife noticed that she could see swelling and "heart" pulsing in his neck.  He does have swelling in his left ankle more than the right.  Patient Active Problem List   Diagnosis Date Noted  . Pneumonia 05/09/2017  . Elevated brain natriuretic peptide (BNP) level 05/09/2017  . Aspiration pneumonia ([place]) 05/09/2017  . Research study patient 05/02/2017  . Acute systolic CHF (congestive heart failure) ([place]) 11/19/2016  . Hypotension 11/19/2016  . 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 Vitamin D deficiency 11/18/2016  . History of total bilateral knee replacement (TKR) 10/26/2016  . 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 CAD (coronary artery disease) 10/26/2016  . HTN (hypertension) 10/26/2016  . Stage 4 chronic kidney disease ([place]) 10/26/2016  . HLD (hyperlipidemia) 10/26/2016  . Chronic combined systolic and diastolic CHF (congestive heart failure) ([place]) 10/26/2016  . Osteoarthritis 10/26/2016  . H/O aortic valve replacement 10/26/2016  . Atrial fibrillation ([place]) 12/28/2015    No facility-administered encounter medications on file as of 05/09/2017.    Outpatient Encounter Medications as of 05/09/2017    Medication Sig  . allopurinol (ZYLOPRIM) 300 MG tablet Take 1 tablet (300 mg total) by mouth daily.  Marland Kitchen aspirin EC 81 MG tablet Take 81 mg by mouth daily.  Marland Kitchen atorvastatin (LIPITOR) 40 MG tablet Take 1 tablet (40 mg total) by mouth daily at 6 PM.  . bumetanide (BUMEX) 1 MG tablet Take 2 mg (2 Tabs) in the AM and 1 mg (1 tab) in PM, alternating with 1 mg (1 tab), twice a day.  . carvedilol (COREG) 3.125 MG tablet Take 1 tablet (3.125 mg total) 2 (two) times daily by mouth.  . cholecalciferol (VITAMIN D) 1000 units tablet Take 2,000 Units by mouth daily.  . ferrous sulfate (IRON SUPPLEMENT) 325 (65 FE) MG tablet Take 325 mg by mouth daily with breakfast.  . Investigational - Study Medication Take 1 tablet by mouth 2 (two) times daily. Study name: Galactic Heart Failure Research Study Additional study details: Omecamtiv Mecarbil or Placebo  . Multiple Vitamin (MULTIVITAMIN) capsule Take 1 capsule by mouth daily.  . nitroGLYCERIN (NITROSTAT) 0.4 MG SL tablet Place 1 tablet (0.4 mg total) under the tongue every 5 (five) minutes as needed for chest pain.  . sacubitril-valsartan (ENTRESTO) 24-26 MG Take 1 tablet by mouth 2 (two) times daily.  . vitamin C (ASCORBIC ACID) 500 MG tablet Take 500 mg by mouth daily.    Allergies  Allergen Reactions  . Penicillins Hives    Has patient had a PCN reaction causing immediate  rash, facial/tongue/throat swelling, SOB or lightheadedness with hypotension: No Has patient had a PCN reaction causing severe rash involving mucus membranes or skin necrosis: No Has patient had a PCN reaction that required hospitalization: No Has patient had a PCN reaction occurring within the last 10 years: No If all of the above answers are "NO", then may proceed with Cephalosporin use.   . Nsaids     Should avoid    Review of Systems  Constitutional: Positive for chills and fever. Negative for activity change, appetite change and unexpected weight change.  HENT: Negative for  congestion and dental problem.   Eyes: Negative for photophobia and visual disturbance.  Respiratory: Positive for cough and shortness of breath.   Cardiovascular: Positive for palpitations and leg swelling. Negative for chest pain.  Gastrointestinal: Negative for blood in stool, constipation and diarrhea.  Genitourinary: Positive for frequency. Negative for difficulty urinating.       Diuretic  Musculoskeletal: Negative for arthralgias and back pain.  Allergic/Immunologic: Negative for environmental allergies and food allergies.  Neurological: Negative for dizziness and light-headedness.  Hematological: Bruises/bleeds easily.  Psychiatric/Behavioral: Positive for sleep disturbance. The patient is not nervous/anxious.        Shortness of breath and cough increases at night when he tries to lay flat    Pulse (!) 116   Temp 98.1 F (36.7 C) (Temporal)   Resp 18   Ht 5\' 7"  (1.702 m)   Wt 197 lb 1.9 oz (89.4 kg)   SpO2 95%   BMI 30.87 kg/m   Physical Exam  Constitutional: He is oriented to person, place, and time. He appears well-developed and well-nourished. No distress.  Appears mildly ill.  HENT:  Head: Normocephalic and atraumatic.  Right Ear: External ear normal.  Left Ear: External ear normal.  Nose: Nose normal.  Mouth/Throat: Oropharynx is clear and moist.  Eyes: Conjunctivae are normal. Pupils are equal, round, and reactive to light.  Neck: Normal range of motion. JVD present.  JVD right greater than left with visible pulsation  Cardiovascular: Regular rhythm.  Murmur heard. Tachycardia, soft SEM  Pulmonary/Chest: Effort normal. He has rales in the right lower field.  Abdominal: Soft. Bowel sounds are normal.  Musculoskeletal: He exhibits edema.       Right lower leg: He exhibits edema.       Left lower leg: He exhibits edema.  2+ pitting edema in left ankle, 1+ in right  Lymphadenopathy:    He has no cervical adenopathy.  Neurological: He is alert and oriented to  person, place, and time.  Psychiatric: He has a normal mood and affect. His behavior is normal.    ASSESSMENT/PLAN:  1. Tachycardia EKG is abnormal.  I called Dr. Algernon Huxley in the heart failure clinic.  He kindly came to the phone and we discussed Mr. Cheron Schaumann current condition.  We both agreed that he needs to go to the emergency room for further evaluation, chest x-ray, lab work, and likely  interrogation of his pacemaker.  This is discussed with Mr. Pattillo.  Emergency room was called. - EKG 12-Lead   Patient Instructions  I spoke to Dr Peri Maris Lean at the heart failure clinic He wants you to go to the ER  Need pacemaker interrogation Need chest x ray Need blood work     [name], MD

## 2017-05-09 NOTE — Consult Note (Signed)
Primary Physician:  Meda Coffee Primary Cardiologist:  Aundra Dubin   HPI:  Pt is a 66 yo with history of CAD (s/p CABG), aortic and Mitral valve dz (s/p bioprosthetic AV and MV repair in July 2017), LVEF 20 to 25%  S/P st Jude CRT-D in June 2015   He alsohas a history of  CKD stage 3    Followed by Drs Harl Bowie and McLEan  Last seen by Einar Crow in Nov 2018   Patient says that he noticed his pulse increase Saturday and then yesterday   Constant   Some SOB with this  Also doeveoped cough with gray green sputum Went to see Dr Meda Coffee today  Found to be tachycardic  Sent to [facility] ED  Pt denies CP  Some edema  Some dizziness, not severe     Past Medical History:  Diagnosis Date  . Allergy    penicillin  . Anemia   . Arthritis    arthritis  . Atrial fibrillation ([place]) 12/28/2015  . Basal cell carcinoma 2011   scalp/neck/forehead  . CAD (coronary artery disease) 06/21/1999  . 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 CHF (congestive heart failure) ([place]) 06/21/1999  . Chronic kidney disease 2010   stage 4 kidney failure  . Gout   . Heart murmur   . History of blood transfusion    open heart sx  . 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 Hyperlipidemia   . Hypertension 1998  . Thrombocytopenia ([place]) 2017  . Ventricular arrhythmia 2001     (Not in a hospital admission)     Infusions: . azithromycin 500 mg (05/09/17 1405)    Allergies  Allergen Reactions  . Penicillins Hives    Has patient had a PCN reaction causing immediate rash, facial/tongue/throat swelling, SOB or lightheadedness with hypotension: No Has patient had a PCN reaction causing severe rash involving mucus membranes or skin necrosis: No Has patient had a PCN reaction that required hospitalization: No Has patient had a PCN reaction occurring within the last 10 years: No If all of the above answers are "NO", then may proceed with  Cephalosporin use.   . Nsaids     Should avoid    Social History   Socioeconomic History  . Marital status: Married    Spouse name: [name]  . Number of children: 0  . Years of education: 63  . Highest education level: Not on file  Social Needs  . Financial resource strain: Not on file  . Food insecurity - worry: Not on file  . Food insecurity - inability: Not on file  . Transportation needs - medical: Not on file  . Transportation needs - non-medical: Not on file  Occupational History  . 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 Smoking status: Former Smoker    Packs/day: 0.50    Types: Cigarettes    Start date: 06/14/1970    Last attempt to quit: 06/14/2000    Years since quitting: 16.9  . Smokeless tobacco: Never Used  Substance and Sexual Activity  . Alcohol use: Yes    Comment: occas social drink  . Drug use: No  . Sexual activity: Yes    Birth control/protection: Surgical  Other Topics Concern  . 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 Stroke Mother   . Heart disease Father 21  . Multiple myeloma Brother     REVIEW OF SYSTEMS:  All systems reviewed  Negative to the above problem except as noted above.    PHYSICAL EXAM: Vitals:   05/09/17 1330 05/09/17 1400  BP: (!) 83/65 104/73  Pulse: (!) 120 (!) 121  Resp: (!) 23 (!) 22  Temp:    SpO2: 92% 94%     Intake/Output Summary (Last 24 hours) at 05/09/2017 1455 Last data filed at 05/09/2017 1358 Gross per 24 hour  Intake 50 ml  Output -  Net 50 ml    General:  Well appearing. No respiratory difficulty HEENT: normal Neck: supple. JVP is increased tp below jaw . Carotids 2+ bilat; no bruits. No lymphadenopathy or thryomegaly appreciated. Cor: PMI nondisplaced. Regular rate & rhythm.  PMI  diffuse  No rubs, gallops or murmurs. Lungs: Mild rales at bases   Abdomen: soft, nontender, nondistended. No hepatosplenomegaly. No bruits or masses. Good bowel sounds. Extremities: no cyanosis, clubbing, rash 1-2+ edema Neuro: alert & oriented x 3, cranial nerves grossly intact. moves all 4 extremities w/o difficulty. Affect pleasant.  ECG:  Wide coplex tach LBBB morph with   Results for orders placed or performed during the hospital encounter of 05/09/17 (from the past 24 hour(s))  CBC with Differential     Status: Abnormal   Collection Time: 05/09/17 11:21 AM  Result Value Ref Range   WBC 8.5 4.0 - 10.5 K/uL   RBC 4.19 (L) 4.22 - 5.81 MIL/uL   Hemoglobin 13.4 13.0 - 17.0 g/dL   HCT 43.9 39.0 - 52.0 %   MCV 104.8 (H) 78.0 - 100.0 fL   MCH 32.0 26.0 - 34.0 pg   MCHC 30.5 30.0 - 36.0 g/dL   RDW 16.6 (H) 11.5 - 15.5 %   Platelets 155 150 - 400 K/uL   Neutrophils Relative % 79 %   Neutro Abs 6.8 1.7 - 7.7 K/uL   Lymphocytes Relative 9 %   Lymphs Abs 0.7 0.7 - 4.0 K/uL   Monocytes Relative 11 %   Monocytes Absolute 0.9 0.1 - 1.0 K/uL   Eosinophils Relative 1 %   Eosinophils Absolute 0.1 0.0 - 0.7 K/uL   Basophils Relative 0 %   Basophils Absolute 0.0 0.0 - 0.1 K/uL  Comprehensive metabolic panel     Status: Abnormal   Collection Time: 05/09/17 11:21 AM  Result Value Ref Range   Sodium 138 135 - 145 mmol/L   Potassium 4.3 3.5 - 5.1 mmol/L   Chloride 101 101 - 111 mmol/L   CO2 24 22 - 32 mmol/L   Glucose, Bld 115 (H) 65 - 99 mg/dL   BUN 79 (H) 6 - 20 mg/dL   Creatinine, Ser 2.73 (H) 0.61 - 1.24 mg/dL   Calcium 9.6 8.9 - 10.3 mg/dL   Total Protein 7.1 6.5 - 8.1 g/dL   Albumin 4.2 3.5 - 5.0 g/dL   AST 66 (H) 15 - 41 U/L   ALT 65 (H) 17 - 63 U/L   Alkaline Phosphatase 84 38 - 126 U/L   Total Bilirubin 1.0 0.3 - 1.2 mg/dL   GFR calc non Af Amer 22 (L) >60 mL/min   GFR calc Af Amer 26 (L) >60 mL/min   Anion gap 13 5 - 15  Troponin I     Status: Abnormal   Collection Time:  05/09/17 11:21 AM  Result Value Ref Range   Troponin I 0.07 (HH) <0.03 ng/mL  Brain natriuretic peptide     Status: Abnormal   Collection Time: 05/09/17 12:15 PM  Result Value Ref Range   B Natriuretic Peptide 1,626.0 (H) 0.0 - 100.0 pg/mL   Dg Chest 2 View  Addendum Date: 05/09/2017   ADDENDUM REPORT: 05/09/2017 13:19 ADDENDUM: The impression should state: Cardiomegaly with small bilateral pleural effusions and mild pulmonary venous congestion. New right base airspace disease, suspicious for infection or aspiration. Electronically Signed   By: Abigail Miyamoto M.D.   On: 05/09/2017 13:19   Result Date: 05/09/2017 CLINICAL DATA:  COUGH AND SOB X A FEW DAYS, HEART SURGERY IN 2016, HTN, CHF, FORMER SMOKER SINCE 2002 EXAM: CHEST  2 VIEW COMPARISON:  11/19/2016 FINDINGS: Pacer/AICD device. Midline trachea. Moderate cardiomegaly. Small bilateral pleural effusions. No pneumothorax. Pulmonary interstitial prominence and indistinctness. New right base airspace disease. Suspect similar left base subsegmental atelectasis or scarring. Cardiomegaly with small bilateral pleural effusions and mild pulmonary venous congestion. New right base airspace disease, suspicious for infection or aspiration. IMPRESSION: No active cardiopulmonary disease. Electronically Signed: By: Abigail Miyamoto M.D. On: 05/09/2017 12:44   US Venous Img Lower Unilateral Left  Result Date: 05/09/2017 CLINICAL DATA:  Left lower extremity pain and edema for the past 2 days. Former smoker. History of skin cancer and bilateral total knee replacements. Evaluate for DVT. EXAM: LEFT LOWER EXTREMITY VENOUS DOPPLER ULTRASOUND TECHNIQUE: Gray-scale sonography with graded compression, as well as color Doppler and duplex ultrasound were performed to evaluate the lower extremity deep venous systems from the level of the common femoral vein and including the common femoral, femoral, profunda femoral, popliteal and calf veins including the posterior tibial,  peroneal and gastrocnemius veins when visible. The superficial great saphenous vein was also interrogated. Spectral Doppler was utilized to evaluate flow at rest and with distal augmentation maneuvers in the common femoral, femoral and popliteal veins. COMPARISON:  None. FINDINGS: Contralateral Common Femoral Vein: Respiratory phasicity is normal and symmetric with the symptomatic side. No evidence of thrombus. Normal compressibility. Common Femoral Vein: No evidence of thrombus. Normal compressibility, respiratory phasicity and response to augmentation. Saphenofemoral Junction: No evidence of thrombus. Normal compressibility and flow on color Doppler imaging. Profunda Femoral Vein: No evidence of thrombus. Normal compressibility and flow on color Doppler imaging. Femoral Vein: No evidence of thrombus. Normal compressibility, respiratory phasicity and response to augmentation. Popliteal Vein: No evidence of thrombus. Normal compressibility, respiratory phasicity and response to augmentation. Calf Veins: No evidence of thrombus. Normal compressibility and flow on color Doppler imaging. Superficial Great Saphenous Vein: No evidence of thrombus. Normal compressibility. Venous Reflux:  None. Other Findings: Pulsatile flow was demonstrated throughout the entirety the interrogated portions of the left lower extremity venous system as well as the contralateral right common femoral vein. IMPRESSION: 1. No evidence of DVT within either lower extremity. 2. Pulsatile flow demonstrated throughout the interrogated portions of the left lower extremity venous system as well as the contralateral right common femoral vein, nonspecific though could be seen in the setting of right-sided heart failure/congestive heart failure. Clinical correlation is advised. Electronically Signed   By: Sandi Mariscal M.D.   On: 05/09/2017 13:16     ASSESSMENT:  Patient is a 66 yo with CAD, CHF, MV and AV dz who presents today to ED with tachycardia   Appears to be in atrial  flutter with 2:1 conduction  Patient with evid of volume overload on exam.  BP is fragile in low 80s   CXR with CHF and ? RLL infiltrate  1  Atrial flutter  Plan  Will tx to [place] was reprogrammed to [facility] Will heparinize   Pt needs TEE/Cardioversion tomorrow  It is not completely clear when he went into flutter  May be 48 hours or a little longer   2  Acute on chronic systolic CHF   Volume is increaesed   Exacerbated by atrial arrhythmias. Diuresis limited by low bp Would try diuresis when in SR  May need to try earlier EP aware of pt   He has BiV ICD  WIll see at [facility]  Some lead issues Stop Corlanor  3  CAD  S/p CABG in 2017  I am not convinced of active ischemia  4  Valvular dz  (s/p AVR and MV repair in 2017)    5  Pulmonary  Empiric ABX given for possible pneumonia  Reassess tomorrow  5  CKD  Stage III  May expect some decrease in function with hypotension, CHF

## 2017-05-09 NOTE — ED Notes (Signed)
Merlin device used to attempt transmission, per technical support transmitter reset and will attempt again once pt returns from x-ray.

## 2017-05-09 NOTE — ED Triage Notes (Addendum)
PT c/o SOB with dry cough x2 days. Pt was seen at Dr. Francesca Oman office today and had ekg done and was told to come to ED by Dr. Aundra Dubin ([place] [facility] doctor). Pt needs to have his pacemaker interrogated today.

## 2017-05-09 NOTE — Telephone Encounter (Signed)
Pt can come in and I will work him in as we have no open appts, please advise he may have to wait.

## 2017-05-09 NOTE — ED Provider Notes (Signed)
[facility] EMERGENCY DEPARTMENT Provider Note   CSN: [id number] Arrival date & time: 05/09/17  1050     History   Chief Complaint Chief Complaint  Patient presents with  . Shortness of Breath    HPI Dillon Cisneros is a 66 y.o. male.  HPI Patient presents with shortness of breath for the last 2 days.  Seen at primary care doctor and sent here after discussion with Dr. Aundra Dubin from the heart failure clinic.  Has had a shortness of breath with a dry cough for 2 days.  .  Mild white sputum production.  States this feels like when he had pneumonia.  He has chronic heart failure and chronic pacemaker.  He states he put on a couple pounds over Thanksgiving.  States his legs are more swollen than normal.  In particular his left leg is more swollen than baseline.  No fevers or chills.  States he feels weak all over.  His blood pressure is under 202 systolic which he states is normal for him.  His heart rate has going fast.  Has had adjustment of his medications through the heart failure clinic to try and control his heart rate and improve his pacemaker effectiveness. Past Medical History:  Diagnosis Date  . Allergy    penicillin  . Anemia   . Arthritis    arthritis  . Atrial fibrillation ([place]) 12/28/2015  . Basal cell carcinoma 2011   scalp/neck/forehead  . CAD (coronary artery disease) 06/21/1999  . 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 CHF (congestive heart failure) ([place]) 06/21/1999  . Chronic kidney disease 2010   stage 4 kidney failure  . Gout   . Heart murmur   . History of blood transfusion    open heart sx  . 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 Hyperlipidemia   . Hypertension 1998  . Thrombocytopenia ([place]) 2017  . Ventricular arrhythmia 2001    Patient Active Problem List   Diagnosis Date Noted  . Research study patient 05/02/2017  . CHF (congestive heart failure) ([place])  11/21/2016  . Acute systolic CHF (congestive heart failure) ([place]) 11/19/2016  . Hypotension 11/19/2016  . 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 Vitamin D deficiency 11/18/2016  . History of total bilateral knee replacement (TKR) 10/26/2016  . 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 CAD (coronary artery disease) 10/26/2016  . HTN (hypertension) 10/26/2016  . Stage 4 chronic kidney disease ([place]) 10/26/2016  . HLD (hyperlipidemia) 10/26/2016  . Chronic combined systolic and diastolic CHF (congestive heart failure) ([place]) 10/26/2016  . Osteoarthritis 10/26/2016  . H/O aortic valve replacement 10/26/2016    Past Surgical History:  Procedure Laterality Date  . CORONARY ARTERY BYPASS GRAFT  12/25/2015  . JOINT REPLACEMENT     both knees replaced  . KNEE SURGERY     bilateral  . SHOULDER SURGERY     left       Home Medications    Prior to Admission medications   Medication Sig Start Date End Date Taking? Authorizing Provider  allopurinol (ZYLOPRIM) 300 MG tablet Take 1 tablet (300 mg total) by mouth daily. 11/16/16   [name], MD  aspirin EC 81 MG tablet Take 81 mg by mouth daily.    [provider]  atorvastatin (LIPITOR) 40 MG tablet Take 1 tablet (40 mg total) by mouth daily at 6 PM. 11/16/16   [name], MD  azithromycin (ZITHROMAX) 500 MG tablet Take 500 mg 30 minutes to 1 hour prior to dental procedure Patient not taking: Reported on 05/09/2017 05/03/17   Arnoldo Lenis, MD  bumetanide (BUMEX) 1 MG tablet Take 2 mg (2 Tabs) in the AM and 1 mg (1 tab) in PM, alternating with 1 mg (1 tab), twice a day. 04/05/17   Larey Dresser, MD  carvedilol (COREG) 3.125 MG tablet Take 1 tablet (3.125 mg total) 2 (two) times daily by mouth. 04/19/17   Larey Dresser, MD  cholecalciferol (VITAMIN D) 1000 units tablet Take 2,000 Units by mouth daily.    [provider]  ferrous sulfate (IRON SUPPLEMENT) 325 (65 FE) MG tablet Take 325 mg by  mouth daily with breakfast.    [provider]  Investigational - Study Medication Take 1 tablet by mouth 2 (two) times daily. Study name: [place] Heart Failure Research Study Additional study details: Omecamtiv Mecarbil or Placebo 03/22/17   Larey Dresser, MD  ivabradine (CORLANOR) 7.5 MG TABS tablet Take 1 tablet (7.5 mg total) by mouth 2 (two) times daily with a meal. 05/09/17   Larey Dresser, MD  Multiple Vitamin (MULTIVITAMIN) capsule Take 1 capsule by mouth daily.    [provider]  nitroGLYCERIN (NITROSTAT) 0.4 MG SL tablet Place 1 tablet (0.4 mg total) under the tongue every 5 (five) minutes as needed for chest pain. 12/01/16   [name], MD  Probiotic Product (SOLUBLE FIBER/PROBIOTICS PO) Take by mouth.    [provider]  sacubitril-valsartan (ENTRESTO) 24-26 MG Take 1 tablet by mouth 2 (two) times daily. 02/02/17   [name], MD  vitamin C (ASCORBIC ACID) 500 MG tablet Take 500 mg by mouth daily.    [provider]    Family History Family History  Problem Relation Age of Onset  . Stroke Mother   . Heart disease Father 16  . Multiple myeloma Brother     Social History Social History   Tobacco Use  . Smoking status: Former Smoker    Packs/day: 0.50    Types: Cigarettes    Start date: 06/14/1970    Last attempt to quit: 06/14/2000    Years since quitting: 16.9  . Smokeless tobacco: Never Used  Substance Use Topics  . Alcohol use: Yes    Comment: occas social drink  . Drug use: No     Allergies   Penicillins and Nsaids   Review of Systems Review of Systems  Constitutional: Positive for fatigue. Negative for appetite change and fever.  HENT: Negative for congestion.   Respiratory: Positive for cough and shortness of breath.   Cardiovascular: Positive for leg swelling. Negative for chest pain.  Gastrointestinal: Negative for abdominal pain.  Genitourinary: Negative for flank pain.  Musculoskeletal: Negative  for back pain.  Neurological: Positive for weakness. Negative for numbness.  Hematological: Negative for adenopathy.  Psychiatric/Behavioral: Negative for confusion.     Physical Exam Updated Vital Signs BP 90/75 Comment: Simultaneous filing. User may not have seen previous data.  Pulse 65 Comment: Simultaneous filing. User may not have seen previous data.  Temp 98.1 F (36.7 C) (Oral)   Resp (!) 21 Comment: Simultaneous filing. User may not have seen previous data.  Ht _0  (1.702 m)   Wt 89.4 kg (197 lb)   SpO2 93% Comment: Simultaneous filing. User may not have seen previous data.  BMI 30.85 kg/m   Physical Exam  Constitutional: He appears well-developed.  HENT:  Head: Normocephalic.  Neck: Normal range of motion.  Cardiovascular: Intact distal pulses.  Tachycardia  Pulmonary/Chest:  Rales at right base.  Abdominal: Soft. There is no tenderness.  Musculoskeletal:       Right lower leg: He exhibits edema.       Left lower leg: He exhibits edema.  Edema to bilateral lower extremities, however worse on the left side.  Neurological: He is alert.  Skin: Capillary refill takes less than 2 seconds.  Psychiatric: He has a normal mood and affect.     ED Treatments / Results  Labs (all labs ordered are listed, but only abnormal results are displayed) Labs Reviewed  CBC WITH DIFFERENTIAL/PLATELET - Abnormal; Notable for the following components:      Result Value   RBC 4.19 (*)    MCV 104.8 (*)    RDW 16.6 (*)    All other components within normal limits  COMPREHENSIVE METABOLIC PANEL - Abnormal; Notable for the following components:   Glucose, Bld 115 (*)    BUN 79 (*)    Creatinine, Ser 2.73 (*)    AST 66 (*)    ALT 65 (*)    GFR calc non Af Amer 22 (*)    GFR calc Af Amer 26 (*)    All other components within normal limits  TROPONIN I - Abnormal; Notable for the following components:   Troponin I 0.07 (*)    All other components within normal limits  BRAIN  NATRIURETIC PEPTIDE - Abnormal; Notable for the following components:   B Natriuretic Peptide 1,626.0 (*)    All other components within normal limits    EKG  EKG Interpretation  Date/Time:  Monday May 09 2017 11:02:02 EST Ventricular Rate:  120 PR Interval:    QRS Duration: 188 QT Interval:  420 QTC Calculation: 593 R Axis:   180 Text Interpretation:  Ventricular-paced rhythm Abnormal ECG Confirmed by Fredia Sorrow [phone]) on 05/09/2017 11:20:57 AM       Radiology Dg Chest 2 View  Addendum Date: 05/09/2017   ADDENDUM REPORT: 05/09/2017 13:19 ADDENDUM: The impression should state: Cardiomegaly with small bilateral pleural effusions and mild pulmonary venous congestion. New right base airspace disease, suspicious for infection or aspiration. Electronically Signed   By: Abigail Miyamoto M.D.   On: 05/09/2017 13:19   Result Date: 05/09/2017 CLINICAL DATA:  COUGH AND SOB X A FEW DAYS, HEART SURGERY IN 2016, HTN, CHF, FORMER SMOKER SINCE 2002 EXAM: CHEST  2 VIEW COMPARISON:  11/19/2016 FINDINGS: Pacer/AICD device. Midline trachea. Moderate cardiomegaly. Small bilateral pleural effusions. No pneumothorax. Pulmonary interstitial prominence and indistinctness. New right base airspace disease. Suspect similar left base subsegmental atelectasis or scarring. Cardiomegaly with small bilateral pleural effusions and mild pulmonary venous congestion. New right base airspace disease, suspicious for infection or aspiration. IMPRESSION: No active cardiopulmonary disease. Electronically Signed: By: Abigail Miyamoto M.D. On: 05/09/2017 12:44   US Venous Img Lower Unilateral Left  Result Date: 05/09/2017 CLINICAL DATA:  Left lower extremity pain and edema for the past 2 days. Former smoker. History of skin cancer and bilateral total knee replacements. Evaluate for DVT. EXAM: LEFT LOWER EXTREMITY VENOUS DOPPLER ULTRASOUND TECHNIQUE: Gray-scale sonography with graded compression, as well as color Doppler  and duplex ultrasound were performed to evaluate the lower extremity deep venous systems from the level of the common femoral vein and including the common femoral, femoral, profunda femoral, popliteal and calf veins including the posterior tibial, peroneal and gastrocnemius veins when visible. The  superficial great saphenous vein was also interrogated. Spectral Doppler was utilized to evaluate flow at rest and with distal augmentation maneuvers in the common femoral, femoral and popliteal veins. COMPARISON:  None. FINDINGS: Contralateral Common Femoral Vein: Respiratory phasicity is normal and symmetric with the symptomatic side. No evidence of thrombus. Normal compressibility. Common Femoral Vein: No evidence of thrombus. Normal compressibility, respiratory phasicity and response to augmentation. Saphenofemoral Junction: No evidence of thrombus. Normal compressibility and flow on color Doppler imaging. Profunda Femoral Vein: No evidence of thrombus. Normal compressibility and flow on color Doppler imaging. Femoral Vein: No evidence of thrombus. Normal compressibility, respiratory phasicity and response to augmentation. Popliteal Vein: No evidence of thrombus. Normal compressibility, respiratory phasicity and response to augmentation. Calf Veins: No evidence of thrombus. Normal compressibility and flow on color Doppler imaging. Superficial Great Saphenous Vein: No evidence of thrombus. Normal compressibility. Venous Reflux:  None. Other Findings: Pulsatile flow was demonstrated throughout the entirety the interrogated portions of the left lower extremity venous system as well as the contralateral right common femoral vein. IMPRESSION: 1. No evidence of DVT within either lower extremity. 2. Pulsatile flow demonstrated throughout the interrogated portions of the left lower extremity venous system as well as the contralateral right common femoral vein, nonspecific though could be seen in the setting of right-sided  heart failure/congestive heart failure. Clinical correlation is advised. Electronically Signed   By: Sandi Mariscal M.D.   On: 05/09/2017 13:16    Procedures Procedures (including critical care time)  Medications Ordered in ED Medications  cefTRIAXone (ROCEPHIN) 1 g in dextrose 5 % 50 mL IVPB (1 g Intravenous New Bag/Given 05/09/17 1328)  azithromycin (ZITHROMAX) 500 mg in dextrose 5 % 250 mL IVPB (not administered)     Initial Impression / Assessment and Plan / ED Course  I have reviewed the triage vital signs and the nursing notes.  Pertinent labs & imaging results that were available during my care of the patient were reviewed by me and considered in my medical decision making (see chart for details).     Patient with shortness of breath and cough.  Does have localizing lung findings.  Baseline hypotension.  Pneumonia on x-ray.  Also a tachycardia.  Persistent.  Pacemaker interrogated and found to be in atrial flutter.  Has had recent tachycardia that may have been related to this so not an urgent cardioversion candidate.  ChadsVasc score of at least 4 for age CHF hypertension and vascular disease.  Discussed with Amy from the heart failure team.  Overall I think he would benefit from admission and the heart failure team feels that he would be better served in [name].  Will admit to the hospitalist and have the cardiologist appears see him with transfer to [facility] planned.  The [facility] rep will come and help with rate control.  Final Clinical Impressions(s) / ED Diagnoses   Final diagnoses:  Community acquired pneumonia, unspecified laterality  Atrial flutter with rapid ventricular response [facility])    ED Discharge Orders    None       Davonna Belling, MD 05/09/17 1346

## 2017-05-10 ENCOUNTER — Inpatient Hospital Stay (HOSPITAL_COMMUNITY): Payer: Medicare Other

## 2017-05-10 ENCOUNTER — Inpatient Hospital Stay (HOSPITAL_COMMUNITY): Payer: Medicare Other | Admitting: Certified Registered"

## 2017-05-10 ENCOUNTER — Encounter (HOSPITAL_COMMUNITY): Payer: Self-pay | Admitting: Nurse Practitioner

## 2017-05-10 ENCOUNTER — Encounter (HOSPITAL_COMMUNITY): Admission: EM | Disposition: E | Payer: Self-pay | Source: Home / Self Care | Attending: Cardiology

## 2017-05-10 DIAGNOSIS — J189 Pneumonia, unspecified organism: Secondary | ICD-10-CM

## 2017-05-10 DIAGNOSIS — Z95810 Presence of automatic (implantable) cardiac defibrillator: Secondary | ICD-10-CM

## 2017-05-10 DIAGNOSIS — I4892 Unspecified atrial flutter: Secondary | ICD-10-CM

## 2017-05-10 DIAGNOSIS — I10 Essential (primary) hypertension: Secondary | ICD-10-CM

## 2017-05-10 DIAGNOSIS — I481 Persistent atrial fibrillation: Secondary | ICD-10-CM

## 2017-05-10 DIAGNOSIS — I484 Atypical atrial flutter: Secondary | ICD-10-CM

## 2017-05-10 HISTORY — PX: TEE WITHOUT CARDIOVERSION: SHX5443

## 2017-05-10 LAB — RESPIRATORY PANEL BY PCR
Adenovirus: NOT DETECTED
BORDETELLA PERTUSSIS-RVPCR: NOT DETECTED
CHLAMYDOPHILA PNEUMONIAE-RVPPCR: NOT DETECTED
Coronavirus 229E: NOT DETECTED
Coronavirus HKU1: NOT DETECTED
Coronavirus NL63: NOT DETECTED
Coronavirus OC43: NOT DETECTED
INFLUENZA A-RVPPCR: NOT DETECTED
Influenza B: NOT DETECTED
METAPNEUMOVIRUS-RVPPCR: NOT DETECTED
Mycoplasma pneumoniae: NOT DETECTED
PARAINFLUENZA VIRUS 2-RVPPCR: NOT DETECTED
PARAINFLUENZA VIRUS 3-RVPPCR: NOT DETECTED
PARAINFLUENZA VIRUS 4-RVPPCR: NOT DETECTED
Parainfluenza Virus 1: NOT DETECTED
RHINOVIRUS / ENTEROVIRUS - RVPPCR: NOT DETECTED
Respiratory Syncytial Virus: NOT DETECTED

## 2017-05-10 LAB — COMPREHENSIVE METABOLIC PANEL
ALK PHOS: 68 U/L (ref 38–126)
ALT: 52 U/L (ref 17–63)
ANION GAP: 12 (ref 5–15)
AST: 37 U/L (ref 15–41)
Albumin: 3.6 g/dL (ref 3.5–5.0)
BUN: 88 mg/dL — ABNORMAL HIGH (ref 6–20)
CALCIUM: 8.8 mg/dL — AB (ref 8.9–10.3)
CHLORIDE: 103 mmol/L (ref 101–111)
CO2: 20 mmol/L — ABNORMAL LOW (ref 22–32)
CREATININE: 2.89 mg/dL — AB (ref 0.61–1.24)
GFR, EST AFRICAN AMERICAN: 24 mL/min — AB (ref >60)
GFR, EST NON AFRICAN AMERICAN: 21 mL/min — AB (ref >60)
Glucose, Bld: 121 mg/dL — ABNORMAL HIGH (ref 65–99)
Potassium: 4 mmol/L (ref 3.5–5.1)
Sodium: 135 mmol/L (ref 135–145)
Total Bilirubin: 0.7 mg/dL (ref 0.3–1.2)
Total Protein: 6.3 g/dL — ABNORMAL LOW (ref 6.5–8.1)

## 2017-05-10 LAB — CBC
HEMATOCRIT: 37.6 % — AB (ref 39.0–52.0)
Hemoglobin: 12.1 g/dL — ABNORMAL LOW (ref 13.0–17.0)
MCH: 32.3 pg (ref 26.0–34.0)
MCHC: 32.2 g/dL (ref 30.0–36.0)
MCV: 100.3 fL — ABNORMAL HIGH (ref 78.0–100.0)
PLATELETS: 132 10*3/uL — AB (ref 150–400)
RBC: 3.75 MIL/uL — ABNORMAL LOW (ref 4.22–5.81)
RDW: 16.4 % — AB (ref 11.5–15.5)
WBC: 7.7 10*3/uL (ref 4.0–10.5)

## 2017-05-10 LAB — COOXEMETRY PANEL
CARBOXYHEMOGLOBIN: 0.9 % (ref 0.5–1.5)
Carboxyhemoglobin: 1.3 % (ref 0.5–1.5)
METHEMOGLOBIN: 1 % (ref 0.0–1.5)
Methemoglobin: 1 % (ref 0.0–1.5)
O2 SAT: 52.6 %
O2 Saturation: 66.2 %
Total hemoglobin: 12.7 g/dL (ref 12.0–16.0)
Total hemoglobin: 12.7 g/dL (ref 12.0–16.0)

## 2017-05-10 LAB — MRSA PCR SCREENING: MRSA BY PCR: NEGATIVE

## 2017-05-10 LAB — HEPARIN LEVEL (UNFRACTIONATED)
HEPARIN UNFRACTIONATED: 0.33 [IU]/mL (ref 0.30–0.70)
HEPARIN UNFRACTIONATED: 0.33 [IU]/mL (ref 0.30–0.70)

## 2017-05-10 LAB — MAGNESIUM: MAGNESIUM: 2.6 mg/dL — AB (ref 1.7–2.4)

## 2017-05-10 LAB — PHOSPHORUS: PHOSPHORUS: 5.3 mg/dL — AB (ref 2.5–4.6)

## 2017-05-10 LAB — HIV ANTIBODY (ROUTINE TESTING W REFLEX): HIV SCREEN 4TH GENERATION: NONREACTIVE

## 2017-05-10 SURGERY — ECHOCARDIOGRAM, TRANSESOPHAGEAL
Anesthesia: Monitor Anesthesia Care

## 2017-05-10 SURGERY — ECHOCARDIOGRAM, TRANSESOPHAGEAL
Anesthesia: Moderate Sedation

## 2017-05-10 MED ORDER — PROPOFOL 10 MG/ML IV BOLUS
INTRAVENOUS | Status: DC | PRN
  Administered 2017-05-10: 20 mg via INTRAVENOUS
  Administered 2017-05-10: 50 mg via INTRAVENOUS
  Administered 2017-05-10: 80 mg via INTRAVENOUS
  Administered 2017-05-10: 30 mg via INTRAVENOUS

## 2017-05-10 MED ORDER — DOBUTAMINE IN D5W 4-5 MG/ML-% IV SOLN
2.5000 ug/kg/min | INTRAVENOUS | Status: DC
  Administered 2017-05-12: 2.5 ug/kg/min via INTRAVENOUS
  Filled 2017-05-10 (×2): qty 250

## 2017-05-10 MED ORDER — PHENYLEPHRINE HCL 10 MG/ML IJ SOLN
INTRAMUSCULAR | Status: DC | PRN
  Administered 2017-05-10: 80 ug/min via INTRAVENOUS

## 2017-05-10 MED ORDER — FUROSEMIDE 10 MG/ML IJ SOLN: 80.0000 mg | Freq: Once | INTRAMUSCULAR | Status: DC

## 2017-05-10 MED ORDER — FUROSEMIDE 10 MG/ML IJ SOLN
80.0000 mg | Freq: Two times a day (BID) | INTRAMUSCULAR | Status: DC
  Administered 2017-05-10 – 2017-05-11 (×4): 80 mg via INTRAVENOUS
  Filled 2017-05-10 (×4): qty 8

## 2017-05-10 MED ORDER — SODIUM CHLORIDE 0.9% FLUSH
3.0000 mL | Freq: Two times a day (BID) | INTRAVENOUS | Status: DC
  Administered 2017-05-10 – 2017-05-18 (×9): 3 mL via INTRAVENOUS

## 2017-05-10 MED ORDER — SODIUM CHLORIDE 0.9 % IV SOLN: INTRAVENOUS | Status: DC | PRN

## 2017-05-10 MED ORDER — SODIUM CHLORIDE 0.9 % IV SOLN: INTRAVENOUS | Status: DC

## 2017-05-10 MED ORDER — DOBUTAMINE IN D5W 4-5 MG/ML-% IV SOLN
2.5000 ug/kg/min | INTRAVENOUS | Status: DC
  Administered 2017-05-10: 2.5 ug/kg/min via INTRAVENOUS
  Filled 2017-05-10: qty 250

## 2017-05-10 MED ORDER — BUTAMBEN-TETRACAINE-BENZOCAINE 2-2-14 % EX AERO
INHALATION_SPRAY | CUTANEOUS | Status: DC | PRN
  Administered 2017-05-10: 2 via TOPICAL

## 2017-05-10 MED ORDER — NOREPINEPHRINE BITARTRATE 1 MG/ML IV SOLN
0.0000 ug/min | INTRAVENOUS | Status: DC
  Administered 2017-05-10: 3 ug/min via INTRAVENOUS
  Administered 2017-05-11 – 2017-05-12 (×4): 8 ug/min via INTRAVENOUS
  Administered 2017-05-12: 6 ug/min via INTRAVENOUS
  Administered 2017-05-13: 9 ug/min via INTRAVENOUS
  Administered 2017-05-13: 5 ug/min via INTRAVENOUS
  Administered 2017-05-13 – 2017-05-15 (×5): 9 ug/min via INTRAVENOUS
  Administered 2017-05-15: 10 ug/min via INTRAVENOUS
  Administered 2017-05-15: 7.5 ug/min via INTRAVENOUS
  Administered 2017-05-15: 7.013 ug/min via INTRAVENOUS
  Administered 2017-05-16: 15 ug/min via INTRAVENOUS
  Filled 2017-05-10 (×16): qty 4

## 2017-05-10 MED ORDER — PHENYLEPHRINE 40 MCG/ML (10ML) SYRINGE FOR IV PUSH (FOR BLOOD PRESSURE SUPPORT)
PREFILLED_SYRINGE | INTRAVENOUS | Status: DC | PRN
  Administered 2017-05-10: 120 ug via INTRAVENOUS
  Administered 2017-05-10: 80 ug via INTRAVENOUS
  Administered 2017-05-10: 200 ug via INTRAVENOUS

## 2017-05-10 MED ORDER — SODIUM CHLORIDE 0.9% FLUSH: 3.0000 mL | INTRAVENOUS | Status: DC | PRN

## 2017-05-10 MED ORDER — STUDY - GALACTIC STUDY - PLACEBO / OMECAMTIV MECARBIL (PI-MCLEAN)
1.0000 | Freq: Two times a day (BID) | Status: DC
  Administered 2017-05-10 – 2017-05-19 (×18): 1 via ORAL
  Filled 2017-05-10 (×18): qty 1

## 2017-05-10 MED ORDER — ORAL CARE MOUTH RINSE
15.0000 mL | Freq: Two times a day (BID) | OROMUCOSAL | Status: DC
  Administered 2017-05-11 – 2017-05-19 (×13): 15 mL via OROMUCOSAL

## 2017-05-10 MED ORDER — MIDAZOLAM HCL 2 MG/2ML IJ SOLN
INTRAMUSCULAR | Status: AC
  Administered 2017-05-10: 1 mg via INTRAVENOUS
  Filled 2017-05-10: qty 2

## 2017-05-10 MED ORDER — CHLORHEXIDINE GLUCONATE CLOTH 2 % EX PADS
6.0000 | MEDICATED_PAD | Freq: Every day | CUTANEOUS | Status: DC
  Administered 2017-05-11 – 2017-05-20 (×10): 6 via TOPICAL

## 2017-05-10 MED ORDER — MIDAZOLAM HCL 2 MG/2ML IJ SOLN
1.0000 mg | Freq: Once | INTRAMUSCULAR | Status: AC
  Administered 2017-05-10: 1 mg via INTRAVENOUS

## 2017-05-10 MED ORDER — SODIUM CHLORIDE 0.9% FLUSH
10.0000 mL | Freq: Two times a day (BID) | INTRAVENOUS | Status: DC
  Administered 2017-05-11: 10 mL
  Administered 2017-05-11: 30 mL
  Administered 2017-05-12 – 2017-05-19 (×14): 10 mL

## 2017-05-10 MED ORDER — SODIUM CHLORIDE 0.9% FLUSH: 10.0000 mL | INTRAVENOUS | Status: DC | PRN

## 2017-05-10 MED ORDER — FENTANYL CITRATE (PF) 100 MCG/2ML IJ SOLN
25.0000 ug | Freq: Once | INTRAMUSCULAR | Status: AC
  Administered 2017-05-10: 25 ug via INTRAVENOUS

## 2017-05-10 MED ORDER — SODIUM CHLORIDE 0.9 % IV SOLN: 250.0000 mL | INTRAVENOUS | Status: DC

## 2017-05-10 MED ORDER — FENTANYL CITRATE (PF) 100 MCG/2ML IJ SOLN
INTRAMUSCULAR | Status: AC
  Administered 2017-05-10: 25 ug via INTRAVENOUS
  Filled 2017-05-10: qty 2

## 2017-05-10 MED ORDER — EPHEDRINE SULFATE-NACL 50-0.9 MG/10ML-% IV SOSY
PREFILLED_SYRINGE | INTRAVENOUS | Status: DC | PRN
  Administered 2017-05-10: 10 mg via INTRAVENOUS

## 2017-05-10 MED ORDER — LIDOCAINE 2% (20 MG/ML) 5 ML SYRINGE
INTRAMUSCULAR | Status: DC | PRN
  Administered 2017-05-10: 50 mg via INTRAVENOUS

## 2017-05-10 NOTE — Transfer of Care (Signed)
Immediate Anesthesia Transfer of Care Note  Patient: Dillon Cisneros  Procedure(s) Performed: TRANSESOPHAGEAL ECHOCARDIOGRAM (TEE) (N/A ) CARDIOVERSION (N/A )  Patient Location: Endoscopy Unit  Anesthesia Type:General  Level of Consciousness: awake, oriented and patient cooperative  Airway & Oxygen Therapy: Patient Spontanous Breathing and Patient connected to nasal cannula oxygen  Post-op Assessment: Report given to RN and Post -op Vital signs reviewed and stable  Post vital signs: Reviewed and stable  Last Vitals:  Vitals:   05/13/2017 1134 05/07/2017 1156  BP: 100/62 91/77  Pulse: (!) 114   Resp:    Temp: (!) 36.2 C (!) 36.3 C  SpO2: 94% 92%    Last Pain:  Vitals:   04/28/2017 1156  TempSrc: Oral  PainSc:          Complications: No apparent anesthesia complications

## 2017-05-10 NOTE — Progress Notes (Signed)
ANTICOAGULATION CONSULT NOTE - [place] for heparin Indication: atrial fibrillation  Allergies  Allergen Reactions  . Penicillins Hives    Has patient had a PCN reaction causing immediate rash, facial/tongue/throat swelling, SOB or lightheadedness with hypotension: No Has patient had a PCN reaction causing severe rash involving mucus membranes or skin necrosis: No Has patient had a PCN reaction that required hospitalization: No Has patient had a PCN reaction occurring within the last 10 years: No If all of the above answers are "NO", then may proceed with Cephalosporin use.   . Nsaids     Should avoid    Patient Measurements: Height: 5\' 7"  (170.2 cm) Weight: 196 lb 12.8 oz (89.3 kg) IBW/kg (Calculated) : 66.1 Heparin Dosing Weight: 84.5kg  Vital Signs: Temp: 97.5 F (36.4 C) (11/27 1402) Temp Source: Oral (11/27 1402) BP: 102/73 (11/27 1453) Pulse Rate: 98 (11/27 1453)  Labs: Recent Labs    05/09/17 1121 05/09/17 1607 05/06/2017 0530 04/18/2017 0827 05/12/2017 1447  HGB 13.4  --  12.1*  --   --   HCT 43.9  --  37.6*  --   --   PLT 155  --  132*  --   --   HEPARINUNFRC  --   --  0.33  --  0.33  CREATININE 2.73*  --   --  2.89*  --   TROPONINI 0.07* 0.07*  --   --   --     Estimated Creatinine Clearance: 26.1 mL/min (A) (by C-G formula based on SCr of 2.89 mg/dL (H)).   Medical History: Past Medical History:  Diagnosis Date  . Anemia   . Arthritis    arthritis  . Atrial fibrillation ([place]) 12/28/2015  . Basal cell carcinoma 2011   scalp/neck/forehead  . CAD (coronary artery disease) 06/21/1999  . 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 CHF (congestive heart failure) ([place]) 06/21/1999  . Chronic kidney disease 2010   stage 4 kidney failure  . Gout   . 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 Hyperlipidemia   . Hypertension 1998  .  Thrombocytopenia ([place]) 2017  . Ventricular arrhythmia 2001    Assessment: 34 YOM transferred from [facility] with A-flutter. Underwent successful TEE-DCCV.  Patient was not on anticoagulation PTA.   Heparin level earlier today came back within goal range at 0.33. Repeat level came back within therapeutic range at 0.33, on 1200 units/hr. No interruptions to heparin infusion. No signs/symptoms of bleeding.   Goal of Therapy:  Heparin level 0.3-0.7 units/ml Monitor platelets by anticoagulation protocol: Yes   Plan:  - Continue heparin infusion at 1200 units/hr - Monitor daily heparin level and CBC - Follow up on plan for outpatient anticoagulation  Doylene Canard, PharmD Clinical Pharmacist  Pager: [phone] Clinical Phone for 04/20/2017 until 3:30pm: [phone] If after 3:30pm, please call main pharmacy at [phone]

## 2017-05-10 NOTE — Progress Notes (Signed)
PCCM Note  Attempted insertion of aline using ultrasound guidance, failed on right and left radial attempt  Unable to thread wire fully to pass catheter.    Kennieth Rad, AGACNP-BC [facility] Pgr: [phone] or if no answer [phone] 04/15/2017, 9:55 PM

## 2017-05-10 NOTE — Plan of Care (Signed)
Patient progressing 

## 2017-05-10 NOTE — Significant Event (Signed)
Rapid Response Event Note  Overview: Time Called: 1700 Arrival Time: 1703 Event Type: Hypotension, Cardiac  Initial Focused Assessment: Patient is alert and oriented.   Bp 82-76/56-65  HR 90 (vpaced)  RR 20  O2 sats 92% on 3L [place]   Interventions: Levophed started at 38mcg via PIV 30mcg fentanyl and 1 mg versed Dr Haroldine Laws at bedside, central line placed  Levophed increased to 5 mcg  BP 90s/70s  HR 91  Transferred to [place] 11 via bed  Plan of Care (if not transferred):  Event Summary: Name of Physician Notified: Dr Aundra Dubin at bedside at      at    Outcome: Transferred (Comment)     Raliegh Ip

## 2017-05-10 NOTE — Progress Notes (Signed)
   Moved to 2H11  COOX 52%  Start 2.5 mcg dobutamine. Repeat CO-OX 2000  Prabhjot Maddux NP-C  6:31 PM

## 2017-05-10 NOTE — Progress Notes (Addendum)
Patient ID: Dillon Cisneros, male   DOB: December 01, 1950, 66 y.o.   MRN: [id number]     Advanced Heart Failure Rounding Note  PCP: Meda Coffee Cardiologist: Aundra Dubin  Subjective:    Patient remains in atrial flutter today, HR 110s.  Not BiV pacing.  He is short of breath when he moves around, comfortable at rest.   He is on amiodarone and heparin gtts.   CXR: Bibasilar airspace disease, ?PNA versus pulmonary edema.   Objective:   Weight Range: 196 lb 12.8 oz (89.3 kg) Body mass index is 30.82 kg/m.   Vital Signs:   Temp:  [97.9 F (36.6 C)-98.4 F (36.9 C)] 97.9 F (36.6 C) (11/27 0400) Pulse Rate:  [64-121] 118 (11/26 2039) Resp:  [18-26] 20 (11/26 2039) BP: (83-108)/(58-82) 90/58 (11/27 0400) SpO2:  [92 %-97 %] 97 % (11/26 2039) Weight:  [196 lb 3.4 oz (89 kg)-197 lb (89.4 kg)] 196 lb 12.8 oz (89.3 kg) (11/27 0429)    Weight change: Filed Weights   05/09/17 1108 05/09/17 1907 05/01/2017 0429  Weight: 197 lb (89.4 kg) 196 lb 3.4 oz (89 kg) 196 lb 12.8 oz (89.3 kg)    Intake/Output:   Intake/Output Summary (Last 24 hours) at 05/07/2017 0751 Last data filed at 05/13/2017 0436 Gross per 24 hour  Intake 967.55 ml  Output 500 ml  Net 467.55 ml      Physical Exam    General:  Well appearing. No resp difficulty HEENT: Normal Neck: Supple. JVP 14-16 cm. No lymphadenopathy or thyromegaly appreciated. Cor: PMI nondisplaced. Tachy, regular rate & rhythm. 2/6 SEM RUSB.  Lungs: Crackles at bases.  Abdomen: Soft, nontender, nondistended. No hepatosplenomegaly. No bruits or masses. Good bowel sounds. Extremities: No cyanosis, clubbing, rash.  1+ edema 1/2 to knees bilaterally Neuro: Alert & orientedx3, cranial nerves grossly intact. moves all 4 extremities w/o difficulty. Affect pleasant   Telemetry   Atrial flutter versus fibrillation with RVR (personally reviewed)   Labs    CBC Recent Labs    05/09/17 1121 04/25/2017 0530  WBC 8.5 7.7  NEUTROABS 6.8  --   HGB 13.4 12.1*    HCT 43.9 37.6*  MCV 104.8* 100.3*  PLT 155 284*   Basic Metabolic Panel Recent Labs    05/09/17 1121  NA 138  K 4.3  CL 101  CO2 24  GLUCOSE 115*  BUN 79*  CREATININE 2.73*  CALCIUM 9.6   Liver Function Tests Recent Labs    05/09/17 1121  AST 66*  ALT 65*  ALKPHOS 84  BILITOT 1.0  PROT 7.1  ALBUMIN 4.2   No results for input(s): LIPASE, AMYLASE in the last 72 hours. Cardiac Enzymes Recent Labs    05/09/17 1121 05/09/17 1607  TROPONINI 0.07* 0.07*    BNP: BNP (last 3 results) Recent Labs    11/19/16 0916 05/09/17 1215  BNP 1,536.0* 1,626.0*    ProBNP (last 3 results) No results for input(s): PROBNP in the last 8760 hours.   D-Dimer No results for input(s): DDIMER in the last 72 hours. Hemoglobin A1C No results for input(s): HGBA1C in the last 72 hours. Fasting Lipid Panel No results for input(s): CHOL, HDL, LDLCALC, TRIG, CHOLHDL, LDLDIRECT in the last 72 hours. Thyroid Function Tests No results for input(s): TSH, T4TOTAL, T3FREE, THYROIDAB in the last 72 hours.  Invalid input(s): FREET3  Other results:   Imaging    Dg Chest 2 View  Addendum Date: 05/09/2017   ADDENDUM REPORT: 05/09/2017 13:19 ADDENDUM: The impression should state:  Cardiomegaly with small bilateral pleural effusions and mild pulmonary venous congestion. New right base airspace disease, suspicious for infection or aspiration. Electronically Signed   By: Abigail Miyamoto M.D.   On: 05/09/2017 13:19   Result Date: 05/09/2017 CLINICAL DATA:  COUGH AND SOB X A FEW DAYS, HEART SURGERY IN 2016, HTN, CHF, FORMER SMOKER SINCE 2002 EXAM: CHEST  2 VIEW COMPARISON:  11/19/2016 FINDINGS: Pacer/AICD device. Midline trachea. Moderate cardiomegaly. Small bilateral pleural effusions. No pneumothorax. Pulmonary interstitial prominence and indistinctness. New right base airspace disease. Suspect similar left base subsegmental atelectasis or scarring. Cardiomegaly with small bilateral pleural  effusions and mild pulmonary venous congestion. New right base airspace disease, suspicious for infection or aspiration. IMPRESSION: No active cardiopulmonary disease. Electronically Signed: By: Abigail Miyamoto M.D. On: 05/09/2017 12:44   US Venous Img Lower Unilateral Left  Result Date: 05/09/2017 CLINICAL DATA:  Left lower extremity pain and edema for the past 2 days. Former smoker. History of skin cancer and bilateral total knee replacements. Evaluate for DVT. EXAM: LEFT LOWER EXTREMITY VENOUS DOPPLER ULTRASOUND TECHNIQUE: Gray-scale sonography with graded compression, as well as color Doppler and duplex ultrasound were performed to evaluate the lower extremity deep venous systems from the level of the common femoral vein and including the common femoral, femoral, profunda femoral, popliteal and calf veins including the posterior tibial, peroneal and gastrocnemius veins when visible. The superficial great saphenous vein was also interrogated. Spectral Doppler was utilized to evaluate flow at rest and with distal augmentation maneuvers in the common femoral, femoral and popliteal veins. COMPARISON:  None. FINDINGS: Contralateral Common Femoral Vein: Respiratory phasicity is normal and symmetric with the symptomatic side. No evidence of thrombus. Normal compressibility. Common Femoral Vein: No evidence of thrombus. Normal compressibility, respiratory phasicity and response to augmentation. Saphenofemoral Junction: No evidence of thrombus. Normal compressibility and flow on color Doppler imaging. Profunda Femoral Vein: No evidence of thrombus. Normal compressibility and flow on color Doppler imaging. Femoral Vein: No evidence of thrombus. Normal compressibility, respiratory phasicity and response to augmentation. Popliteal Vein: No evidence of thrombus. Normal compressibility, respiratory phasicity and response to augmentation. Calf Veins: No evidence of thrombus. Normal compressibility and flow on color Doppler  imaging. Superficial Great Saphenous Vein: No evidence of thrombus. Normal compressibility. Venous Reflux:  None. Other Findings: Pulsatile flow was demonstrated throughout the entirety the interrogated portions of the left lower extremity venous system as well as the contralateral right common femoral vein. IMPRESSION: 1. No evidence of DVT within either lower extremity. 2. Pulsatile flow demonstrated throughout the interrogated portions of the left lower extremity venous system as well as the contralateral right common femoral vein, nonspecific though could be seen in the setting of right-sided heart failure/congestive heart failure. Clinical correlation is advised. Electronically Signed   By: Sandi Mariscal M.D.   On: 05/09/2017 13:16   Dg Chest Port 1 View  Result Date: 04/22/2017 CLINICAL DATA:  Pneumonia, shortness of breath. EXAM: PORTABLE CHEST 1 VIEW COMPARISON:  Radiograph yesterday FINDINGS: Post median sternotomy with prosthetic valve. Multilead left-sided pacemaker in place. Cardiomegaly again seen. Worsening vascular congestion. Increasing pleural effusions and bibasilar airspace disease, right greater than left. No pneumothorax. IMPRESSION: 1. Findings suggest CHF with increasing vascular congestion and bilateral pleural effusions. 2. Worsening bibasilar airspace disease, right greater than left, pneumonia versus vascular overlap. Electronically Signed   By: Jeb Levering M.D.   On: 05/05/2017 04:24      Medications:     Scheduled Medications: . allopurinol  300  mg Oral Daily  . aspirin EC  81 mg Oral Daily  . atorvastatin  40 mg Oral q1800  . carvedilol  3.125 mg Oral BID  . furosemide  60 mg Intravenous Q12H  . multivitamin with minerals  1 tablet Oral Daily  . sodium chloride flush  3 mL Intravenous Q12H  . sodium chloride flush  3 mL Intravenous Q12H     Infusions: . sodium chloride    . sodium chloride    . amiodarone 30 mg/hr (04/22/2017 0315)  . azithromycin    .  cefTRIAXone (ROCEPHIN)  IV    . heparin 1,200 Units/hr (05/09/17 2130)     PRN Medications:  sodium chloride, acetaminophen, nitroGLYCERIN, ondansetron (ZOFRAN) IV, sodium chloride flush, sodium chloride flush    Patient Profile   66 yo with history of CAD, aortic and mitral valve disease, ischemic cardiomyopathy, and CKD stage 3 was admitted with acute on chronic systolic CHF, ?PNA, AKI on CKD, and atrial flutter.   Assessment/Plan   1. Acute on chronic systolic CHF: Ischemic cardiomyopathy.  EF 20-25% on 6/18 echo.  St Jude CRT-D device. He is volume overloaded and short of breath, creatinine up to 2.7 from baseline around 1.9.  Suspect atrial flutter and loss of BiV pacing has triggered this exacerbation. BP is soft.  - Need to get him back in NSR, will try to do this morning (see below).  - Needs diuresis but probably will not diurese well until he is back in NSR and BiV pacing again.  Lasix 80 mg IV bid for now.  - Hold Entresto with AKI and low BP.  Will hold Coreg with acute exacerbation.  Did not tolerate spironolactone in the past with hyperkalemia.   - Continue Galactic study drug.  - At baseline, percentage of BiV pacing is below goal.  Likely due to mode switching with baseline sinus tachy and faulty atrial lead.  May need atrial lead re-worked eventually.  2. CAD: S/p CABG.  No chest pain.  TnI mildly elevated with no trend, likely due to demand ischemia/volume overload.  - Continue ASA 81.  - Continue atorvastatin 40 daily.  3. Bioprosthetic aortic valve: Stable on last echo.  4. S/p mitral valve repair: Stable on last echo.  5. AKI on CKD stage 3.  Baseline creatinine 1.9, up to 2.7 yesterday.  Need BMET today.  Suspect cardiorenal syndrome with CHF, loss of atrial function, loss of BiV pacing, and low BP.  Need to correct hemodynamics.  6. Atrial flutter: New for him, with RVR.  Has triggered CHF and likely AKI.  - As above, need to get him back in NSR.  TEE-DCCV today.    - He is on heparin gtt, will need transition to [place] eventually.  - He is on amiodarone gtt currently, continue.   - Discuss with EP regarding ablation and possibly fixing atrial lead.  7. ?PNA: Bibasilar airspace disease.  No fever and normal WBCs.  He is on azithromycin/ceftriaxone empirically.   Length of Stay: 1  Loralie Champagne, MD  04/29/2017, 7:51 AM  Advanced Heart Failure Team Pager [phone] (M-F; 7a - 4p)  Please contact [facility] for night-coverage after hours (4p -7a ) and weekends on [email or web address]

## 2017-05-10 NOTE — Progress Notes (Signed)
   Follow up post cardioversion.   Having lots of PVCs and NSVT.   Increased amio drip to 60 mg per hour.   Dr Aundra Dubin aware.   Mallissa Lorenzen NP-C  4:29 PM

## 2017-05-10 NOTE — H&P (View-Only) (Signed)
Patient ID: Dillon Cisneros, male   DOB: March 06, 1951, 66 y.o.   MRN: [id number]     Advanced Heart Failure Rounding Note  PCP: Meda Coffee Cardiologist: Aundra Dubin  Subjective:    Patient remains in atrial flutter today, HR 110s.  Not BiV pacing.  He is short of breath when he moves around, comfortable at rest.   He is on amiodarone and heparin gtts.   CXR: Bibasilar airspace disease, ?PNA versus pulmonary edema.   Objective:   Weight Range: 196 lb 12.8 oz (89.3 kg) Body mass index is 30.82 kg/m.   Vital Signs:   Temp:  [97.9 F (36.6 C)-98.4 F (36.9 C)] 97.9 F (36.6 C) (11/27 0400) Pulse Rate:  [64-121] 118 (11/26 2039) Resp:  [18-26] 20 (11/26 2039) BP: (83-108)/(58-82) 90/58 (11/27 0400) SpO2:  [92 %-97 %] 97 % (11/26 2039) Weight:  [196 lb 3.4 oz (89 kg)-197 lb (89.4 kg)] 196 lb 12.8 oz (89.3 kg) (11/27 0429)    Weight change: Filed Weights   05/09/17 1108 05/09/17 1907 04/16/2017 0429  Weight: 197 lb (89.4 kg) 196 lb 3.4 oz (89 kg) 196 lb 12.8 oz (89.3 kg)    Intake/Output:   Intake/Output Summary (Last 24 hours) at 05/11/2017 0751 Last data filed at 05/09/2017 0436 Gross per 24 hour  Intake 967.55 ml  Output 500 ml  Net 467.55 ml      Physical Exam    General:  Well appearing. No resp difficulty HEENT: Normal Neck: Supple. JVP 14-16 cm. No lymphadenopathy or thyromegaly appreciated. Cor: PMI nondisplaced. Tachy, regular rate & rhythm. 2/6 SEM RUSB.  Lungs: Crackles at bases.  Abdomen: Soft, nontender, nondistended. No hepatosplenomegaly. No bruits or masses. Good bowel sounds. Extremities: No cyanosis, clubbing, rash.  1+ edema 1/2 to knees bilaterally Neuro: Alert & orientedx3, cranial nerves grossly intact. moves all 4 extremities w/o difficulty. Affect pleasant   Telemetry   Atrial flutter versus fibrillation with RVR (personally reviewed)   Labs    CBC Recent Labs    05/09/17 1121 05/04/2017 0530  WBC 8.5 7.7  NEUTROABS 6.8  --   HGB 13.4 12.1*    HCT 43.9 37.6*  MCV 104.8* 100.3*  PLT 155 694*   Basic Metabolic Panel Recent Labs    05/09/17 1121  NA 138  K 4.3  CL 101  CO2 24  GLUCOSE 115*  BUN 79*  CREATININE 2.73*  CALCIUM 9.6   Liver Function Tests Recent Labs    05/09/17 1121  AST 66*  ALT 65*  ALKPHOS 84  BILITOT 1.0  PROT 7.1  ALBUMIN 4.2   No results for input(s): LIPASE, AMYLASE in the last 72 hours. Cardiac Enzymes Recent Labs    05/09/17 1121 05/09/17 1607  TROPONINI 0.07* 0.07*    BNP: BNP (last 3 results) Recent Labs    11/19/16 0916 05/09/17 1215  BNP 1,536.0* 1,626.0*    ProBNP (last 3 results) No results for input(s): PROBNP in the last 8760 hours.   D-Dimer No results for input(s): DDIMER in the last 72 hours. Hemoglobin A1C No results for input(s): HGBA1C in the last 72 hours. Fasting Lipid Panel No results for input(s): CHOL, HDL, LDLCALC, TRIG, CHOLHDL, LDLDIRECT in the last 72 hours. Thyroid Function Tests No results for input(s): TSH, T4TOTAL, T3FREE, THYROIDAB in the last 72 hours.  Invalid input(s): FREET3  Other results:   Imaging    Dg Chest 2 View  Addendum Date: 05/09/2017   ADDENDUM REPORT: 05/09/2017 13:19 ADDENDUM: The impression should state:  Cardiomegaly with small bilateral pleural effusions and mild pulmonary venous congestion. New right base airspace disease, suspicious for infection or aspiration. Electronically Signed   By: Abigail Miyamoto M.D.   On: 05/09/2017 13:19   Result Date: 05/09/2017 CLINICAL DATA:  COUGH AND SOB X A FEW DAYS, HEART SURGERY IN 2016, HTN, CHF, FORMER SMOKER SINCE 2002 EXAM: CHEST  2 VIEW COMPARISON:  11/19/2016 FINDINGS: Pacer/AICD device. Midline trachea. Moderate cardiomegaly. Small bilateral pleural effusions. No pneumothorax. Pulmonary interstitial prominence and indistinctness. New right base airspace disease. Suspect similar left base subsegmental atelectasis or scarring. Cardiomegaly with small bilateral pleural  effusions and mild pulmonary venous congestion. New right base airspace disease, suspicious for infection or aspiration. IMPRESSION: No active cardiopulmonary disease. Electronically Signed: By: Abigail Miyamoto M.D. On: 05/09/2017 12:44   US Venous Img Lower Unilateral Left  Result Date: 05/09/2017 CLINICAL DATA:  Left lower extremity pain and edema for the past 2 days. Former smoker. History of skin cancer and bilateral total knee replacements. Evaluate for DVT. EXAM: LEFT LOWER EXTREMITY VENOUS DOPPLER ULTRASOUND TECHNIQUE: Gray-scale sonography with graded compression, as well as color Doppler and duplex ultrasound were performed to evaluate the lower extremity deep venous systems from the level of the common femoral vein and including the common femoral, femoral, profunda femoral, popliteal and calf veins including the posterior tibial, peroneal and gastrocnemius veins when visible. The superficial great saphenous vein was also interrogated. Spectral Doppler was utilized to evaluate flow at rest and with distal augmentation maneuvers in the common femoral, femoral and popliteal veins. COMPARISON:  None. FINDINGS: Contralateral Common Femoral Vein: Respiratory phasicity is normal and symmetric with the symptomatic side. No evidence of thrombus. Normal compressibility. Common Femoral Vein: No evidence of thrombus. Normal compressibility, respiratory phasicity and response to augmentation. Saphenofemoral Junction: No evidence of thrombus. Normal compressibility and flow on color Doppler imaging. Profunda Femoral Vein: No evidence of thrombus. Normal compressibility and flow on color Doppler imaging. Femoral Vein: No evidence of thrombus. Normal compressibility, respiratory phasicity and response to augmentation. Popliteal Vein: No evidence of thrombus. Normal compressibility, respiratory phasicity and response to augmentation. Calf Veins: No evidence of thrombus. Normal compressibility and flow on color Doppler  imaging. Superficial Great Saphenous Vein: No evidence of thrombus. Normal compressibility. Venous Reflux:  None. Other Findings: Pulsatile flow was demonstrated throughout the entirety the interrogated portions of the left lower extremity venous system as well as the contralateral right common femoral vein. IMPRESSION: 1. No evidence of DVT within either lower extremity. 2. Pulsatile flow demonstrated throughout the interrogated portions of the left lower extremity venous system as well as the contralateral right common femoral vein, nonspecific though could be seen in the setting of right-sided heart failure/congestive heart failure. Clinical correlation is advised. Electronically Signed   By: Sandi Mariscal M.D.   On: 05/09/2017 13:16   Dg Chest Port 1 View  Result Date: 05/12/2017 CLINICAL DATA:  Pneumonia, shortness of breath. EXAM: PORTABLE CHEST 1 VIEW COMPARISON:  Radiograph yesterday FINDINGS: Post median sternotomy with prosthetic valve. Multilead left-sided pacemaker in place. Cardiomegaly again seen. Worsening vascular congestion. Increasing pleural effusions and bibasilar airspace disease, right greater than left. No pneumothorax. IMPRESSION: 1. Findings suggest CHF with increasing vascular congestion and bilateral pleural effusions. 2. Worsening bibasilar airspace disease, right greater than left, pneumonia versus vascular overlap. Electronically Signed   By: Jeb Levering M.D.   On: 04/15/2017 04:24      Medications:     Scheduled Medications: . allopurinol  300  mg Oral Daily  . aspirin EC  81 mg Oral Daily  . atorvastatin  40 mg Oral q1800  . carvedilol  3.125 mg Oral BID  . furosemide  60 mg Intravenous Q12H  . multivitamin with minerals  1 tablet Oral Daily  . sodium chloride flush  3 mL Intravenous Q12H  . sodium chloride flush  3 mL Intravenous Q12H     Infusions: . sodium chloride    . sodium chloride    . amiodarone 30 mg/hr (04/26/2017 0315)  . azithromycin    .  cefTRIAXone (ROCEPHIN)  IV    . heparin 1,200 Units/hr (05/09/17 2130)     PRN Medications:  sodium chloride, acetaminophen, nitroGLYCERIN, ondansetron (ZOFRAN) IV, sodium chloride flush, sodium chloride flush    Patient Profile   65 yo with history of CAD, aortic and mitral valve disease, ischemic cardiomyopathy, and CKD stage 3 was admitted with acute on chronic systolic CHF, ?PNA, AKI on CKD, and atrial flutter.   Assessment/Plan   1. Acute on chronic systolic CHF: Ischemic cardiomyopathy.  EF 20-25% on 6/18 echo.  St Jude CRT-D device. He is volume overloaded and short of breath, creatinine up to 2.7 from baseline around 1.9.  Suspect atrial flutter and loss of BiV pacing has triggered this exacerbation. BP is soft.  - Need to get him back in NSR, will try to do this morning (see below).  - Needs diuresis but probably will not diurese well until he is back in NSR and BiV pacing again.  Lasix 80 mg IV bid for now.  - Hold Entresto with AKI and low BP.  Will hold Coreg with acute exacerbation.  Did not tolerate spironolactone in the past with hyperkalemia.   - Continue Galactic study drug.  - At baseline, percentage of BiV pacing is below goal.  Likely due to mode switching with baseline sinus tachy and faulty atrial lead.  May need atrial lead re-worked eventually.  2. CAD: S/p CABG.  No chest pain.  TnI mildly elevated with no trend, likely due to demand ischemia/volume overload.  - Continue ASA 81.  - Continue atorvastatin 40 daily.  3. Bioprosthetic aortic valve: Stable on last echo.  4. S/p mitral valve repair: Stable on last echo.  5. AKI on CKD stage 3.  Baseline creatinine 1.9, up to 2.7 yesterday.  Need BMET today.  Suspect cardiorenal syndrome with CHF, loss of atrial function, loss of BiV pacing, and low BP.  Need to correct hemodynamics.  6. Atrial flutter: New for him, with RVR.  Has triggered CHF and likely AKI.  - As above, need to get him back in NSR.  TEE-DCCV today.    - He is on heparin gtt, will need transition to [place] eventually.  - He is on amiodarone gtt currently, continue.   - Discuss with EP regarding ablation and possibly fixing atrial lead.  7. ?PNA: Bibasilar airspace disease.  No fever and normal WBCs.  He is on azithromycin/ceftriaxone empirically.   Length of Stay: 1  Loralie Champagne, MD  05/11/2017, 7:51 AM  Advanced Heart Failure Team Pager [phone] (M-F; 7a - 4p)  Please contact [facility] for night-coverage after hours (4p -7a ) and weekends on [email or web address]

## 2017-05-10 NOTE — Progress Notes (Signed)
  Echocardiogram 2D Echocardiogram has been performed.  Dillon Cisneros F 04/14/2017, 2:02 PM

## 2017-05-10 NOTE — Progress Notes (Addendum)
    Presyncope reported. Increased dyspnea.   SBP in the 70s.   JVP to jaw. O2sats 89%.   - Will need central access.  - Will need to move to ICU start Norepi 3 mcg.  - Diurese with IV lasix.    Amy Clegg NP-C  4:44 PM  Patient hypotensive with SBP in 70s.  Feels ok. JVP to jaw.  Afebrile.  Remains in NSR with BiV pacing post-DCCV.   Will need central access, start with norepinephrine at low dose.  Follow CVP and co-ox.    Loralie Champagne 05/11/2017 4:59 PM

## 2017-05-10 NOTE — Interval H&P Note (Signed)
History and Physical Interval Note:  04/15/2017 1:33 PM  Dillon Cisneros  has presented today for surgery, with the diagnosis of a fib  The various methods of treatment have been discussed with the patient and family. After consideration of risks, benefits and other options for treatment, the patient has consented to  Procedure(s): TRANSESOPHAGEAL ECHOCARDIOGRAM (TEE) (N/A) CARDIOVERSION (N/A) as a surgical intervention .  The patient's history has been reviewed, patient examined, no change in status, stable for surgery.  I have reviewed the patient's chart and labs.  Questions were answered to the patient's satisfaction.     Lacresha Fusilier [employer]

## 2017-05-10 NOTE — Progress Notes (Signed)
[place] for heparin Indication: atrial fibrillation  Allergies  Allergen Reactions  . Penicillins Hives    Has patient had a PCN reaction causing immediate rash, facial/tongue/throat swelling, SOB or lightheadedness with hypotension: No Has patient had a PCN reaction causing severe rash involving mucus membranes or skin necrosis: No Has patient had a PCN reaction that required hospitalization: No Has patient had a PCN reaction occurring within the last 10 years: No If all of the above answers are "NO", then may proceed with Cephalosporin use.   . Nsaids     Should avoid    Patient Measurements: Height: 5\' 7"  (170.2 cm) Weight: 196 lb 12.8 oz (89.3 kg) IBW/kg (Calculated) : 66.1 Heparin Dosing Weight: 84.5kg  Vital Signs: Temp: 97.9 F (36.6 C) (11/27 0400) Temp Source: Oral (11/26 2039) BP: 90/58 (11/27 0400) Pulse Rate: 118 (11/26 2039)  Labs: Recent Labs    05/09/17 1121 05/09/17 1607 04/26/2017 0530  HGB 13.4  --  12.1*  HCT 43.9  --  37.6*  PLT 155  --  132*  HEPARINUNFRC  --   --  0.33  CREATININE 2.73*  --   --   TROPONINI 0.07* 0.07*  --     Estimated Creatinine Clearance: 27.6 mL/min (A) (by C-G formula based on SCr of 2.73 mg/dL (H)).  Assessment: 66 y.o. male with Aflutter awaiting cardioversion for heparin  Goal of Therapy:  Heparin level 0.3-0.7 units/ml Monitor platelets by anticoagulation protocol: Yes   Plan:  Continue Heparin at current rate  Recheck level at noon to verify  Phillis Knack, PharmD, BCPS  04/30/2017,6:35 AM

## 2017-05-10 NOTE — Procedures (Signed)
Central Venous Catheter Insertion Procedure Note Dillon Cisneros [id number] 30-Mar-1951  Procedure: Insertion of Central Venous Catheter Indications: Drug and/or fluid administration  Procedure Details Consent: Risks of procedure as well as the alternatives and risks of each were explained to the (patient/caregiver).  Consent for procedure obtained. Time Out: Verified patient identification, verified procedure, site/side was marked, verified correct patient position, special equipment/implants available, medications/allergies/relevent history reviewed, required imaging and test results available.  Performed  Maximum sterile technique was used including antiseptics, cap, gloves, gown, hand hygiene, mask and sheet. Skin prep: Chlorhexidine; local anesthetic administered A antimicrobial bonded/coated triple lumen catheter was placed in the right internal jugular vein using the Seldinger technique and real-time u/s guidance.  Evaluation Blood flow good Complications: No apparent complications Patient did tolerate procedure well. Chest X-ray ordered to verify placement.  CXR: pending.  [name] MD 05/09/2017, 5:57 PM

## 2017-05-10 NOTE — CV Procedure (Signed)
Procedure: TEE   Indication: Atrial flutter  Sedation: Propofol per anesthesiology.   Findings: Please see echo section for full report.  Mildly dilated left ventricle with EF 25%, diffuse hypokinesis with septal-lateral dyssynchrony.  Mildly dilated right ventricle with mildly decreased systolic function.  Moderate left atrial enlargement.  The LA appendage appears to have been ligated.  No thrombus in left atrium.  Mild right atrial enlargement. Pacemaker on right side of heart.  No PFO/ASD noted by color doppler.  There was moderate TR, peak RV-RA gradient 35 mmHg.  Status post mitral valve repair.  No significant stenosis but there did appear to be moderate eccentric mitral regurgitation.  There was no systolic reversal in the pulmonary vein PW doppler pattern.  There was a bioprosthetic aortic valve with trivial regurgitation and no significant stenosis (mean gradient 10 mmHg).  Normal caliber aorta with grade III plaque in the descending thoracic aorta.    No LA appendage, patient will undergo cardioversion.   Loralie Champagne 04/20/2017 1:56 PM

## 2017-05-10 NOTE — Consult Note (Signed)
ELECTROPHYSIOLOGY CONSULT NOTE    Patient ID: Tymel Conely MRN: [id number], DOB/AGE: 1950/12/03 66 y.o.  Admit date: 05/09/2017 Date of Consult: 05/09/2017  Primary Physician: [name], MD Primary Cardiologist: Aundra Dubin Electrophysiologist: Lovena Le  Patient Profile: Regina Coppolino is a 67 y.o. male with a history of CAD s/p CABG, CHF, s/p MVR, and CKD who is being seen today for the evaluation of atrial flutter at the request of Dr Aundra Dubin.  HPI:  Yehudah Standing is a 66 y.o. male who presented to the ER at [facility] yesterday with a 2 day history of progressive shortness of breath on exertion as well as dry cough and sputum production.  He denies recent fevers or chills. He has also noticed his heart rate being elevated for the past 2 days. CXR concerning for PNA - antibiotics started. He was transferred to [facility] for further evaluation and management by AHF team.  He has been started on Heparin with plans for TEE/DCCV today.  He denies chest pain, orthopnea, nausea, vomiting, dizziness, syncope, weight gain, or early satiety.  Last echo 11/2016 demonstrated EF 20-25%, diffuse hypokinesis, post MVR, LA severely dilated.   Past Medical History:  Diagnosis Date  . Allergy    penicillin  . Anemia   . Arthritis    arthritis  . Atrial fibrillation ([place]) 12/28/2015  . Basal cell carcinoma 2011   scalp/neck/forehead  . CAD (coronary artery disease) 06/21/1999  . 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 CHF (congestive heart failure) ([place]) 06/21/1999  . Chronic kidney disease 2010   stage 4 kidney failure  . Gout   . Heart murmur   . History of blood transfusion    open heart sx  . 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 Hyperlipidemia   . Hypertension 1998  . Thrombocytopenia ([place]) 2017  . Ventricular arrhythmia 2001     Surgical History:  Past Surgical History:  Procedure Laterality  Date  . CORONARY ARTERY BYPASS GRAFT  12/25/2015  . JOINT REPLACEMENT     both knees replaced  . KNEE SURGERY     bilateral  . SHOULDER SURGERY     left     Medications Prior to Admission  Medication Sig Dispense Refill Last Dose  . allopurinol (ZYLOPRIM) 300 MG tablet Take 1 tablet (300 mg total) by mouth daily. 90 tablet 3 05/09/2017 at Unknown time  . aspirin EC 81 MG tablet Take 81 mg by mouth daily.   05/08/2017 at Unknown time  . atorvastatin (LIPITOR) 40 MG tablet Take 1 tablet (40 mg total) by mouth daily at 6 PM. 90 tablet 3 05/08/2017 at Unknown time  . bumetanide (BUMEX) 1 MG tablet Take 2 mg (2 Tabs) in the AM and 1 mg (1 tab) in PM, alternating with 1 mg (1 tab), twice a day. 225 tablet 3 05/09/2017 at Unknown time  . carvedilol (COREG) 3.125 MG tablet Take 1 tablet (3.125 mg total) 2 (two) times daily by mouth. 180 tablet 3 05/09/2017 at [place]  . cholecalciferol (VITAMIN D) 1000 units tablet Take 2,000 Units by mouth daily.   05/09/2017 at Unknown time  . CORLANOR 5 MG TABS tablet Take 5 mg by mouth 2 (two) times daily.   05/09/2017 at Unknown time  . ferrous sulfate (IRON SUPPLEMENT) 325 (65 FE) MG tablet Take 325 mg by mouth daily with breakfast.   05/09/2017 at Unknown time  . Investigational -  Study Medication Take 1 tablet by mouth 2 (two) times daily. Study name: [place] Heart Failure Research Study Additional study details: Omecamtiv Mecarbil or Placebo 1 each PRN 05/09/2017 at [place]  . Multiple Vitamin (MULTIVITAMIN) capsule Take 1 capsule by mouth daily.   05/09/2017 at Unknown time  . nitroGLYCERIN (NITROSTAT) 0.4 MG SL tablet Place 1 tablet (0.4 mg total) under the tongue every 5 (five) minutes as needed for chest pain. 60 tablet 1 unknown  . sacubitril-valsartan (ENTRESTO) 24-26 MG Take 1 tablet by mouth 2 (two) times daily. 180 tablet 3 05/09/2017 at Unknown time  . vitamin C (ASCORBIC ACID) 500 MG tablet Take 500 mg by mouth daily.   05/09/2017 at Unknown time     Inpatient Medications:  . allopurinol  300 mg Oral Daily  . aspirin EC  81 mg Oral Daily  . atorvastatin  40 mg Oral q1800  . carvedilol  3.125 mg Oral BID  . furosemide  60 mg Intravenous Q12H  . multivitamin with minerals  1 tablet Oral Daily  . sodium chloride flush  3 mL Intravenous Q12H    Allergies:  Allergies  Allergen Reactions  . Penicillins Hives    Has patient had a PCN reaction causing immediate rash, facial/tongue/throat swelling, SOB or lightheadedness with hypotension: No Has patient had a PCN reaction causing severe rash involving mucus membranes or skin necrosis: No Has patient had a PCN reaction that required hospitalization: No Has patient had a PCN reaction occurring within the last 10 years: No If all of the above answers are "NO", then may proceed with Cephalosporin use.   . Nsaids     Should avoid    Social History   Socioeconomic History  . Marital status: Married    Spouse name: [name]  . Number of children: 0  . Years of education: 37  . Highest education level: Not on file  Social Needs  . Financial resource strain: Not on file  . Food insecurity - worry: Not on file  . Food insecurity - inability: Not on file  . Transportation needs - medical: Not on file  . Transportation needs - non-medical: Not on file  Occupational History  . 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 Smoking status: Former Smoker    Packs/day: 0.50    Types: Cigarettes    Start date: 06/14/1970    Last attempt to quit: 06/14/2000    Years since quitting: 16.9  . Smokeless tobacco: Never Used  Substance and Sexual Activity  . Alcohol use: Yes    Comment: occas social drink  . Drug use: No  . Sexual activity: Yes    Birth control/protection: Surgical  Other Topics Concern  . 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 Stroke Mother   . Heart disease Father 62  . Multiple myeloma Brother      Review of Systems: All other systems reviewed and are otherwise negative except as noted above.  Physical Exam: Vitals:   05/09/17 1907 05/09/17 2039 04/15/2017 0400 04/29/2017 0429  BP: (!) 86/61 93/70 (!) 90/58   Pulse: (!) 119 (!) 118    Resp:  20    Temp: 98 F (36.7 C)  98.4 F (36.9 C) 97.9 F (36.6 C)   TempSrc: Oral Oral    SpO2: 94% 97%    Weight: 196 lb 3.4 oz (89 kg)   196 lb 12.8 oz (89.3 kg)  Height: 5' 7" (1.702 m)       GEN- The patient is ill appearing, alert and oriented x 3 today.   HEENT: normocephalic, atraumatic; sclera clear, conjunctiva pink; hearing intact; oropharynx clear; neck supple, +JVD - 9 cm Lungs- Increased work of breathing Heart- Tachycardic irregular rate and rhythm  GI- soft, non-tender, non-distended, bowel sounds present Extremities- no clubbing, cyanosis, or edema  MS- no significant deformity or atrophy Skin- warm and dry, no rash or lesion Psych- euthymic mood, full affect Neuro- strength and sensation are intact  Labs:   Lab Results  Component Value Date   WBC 7.7 05/11/2017   HGB 12.1 (L) 04/16/2017   HCT 37.6 (L) 04/30/2017   MCV 100.3 (H) 04/29/2017   PLT 132 (L) 05/02/2017    Recent Labs  Lab 05/09/17 1121  NA 138  K 4.3  CL 101  CO2 24  BUN 79*  CREATININE 2.73*  CALCIUM 9.6  PROT 7.1  BILITOT 1.0  ALKPHOS 84  ALT 65*  AST 66*  GLUCOSE 115*      Radiology/Studies: Dg Chest 2 View Addendum Date: 05/09/2017   ADDENDUM REPORT: 05/09/2017 13:19 ADDENDUM: The impression should state: Cardiomegaly with small bilateral pleural effusions and mild pulmonary venous congestion. New right base airspace disease, suspicious for infection or aspiration. Electronically Signed   By: Abigail Miyamoto M.D.   On: 05/09/2017 13:19  CLINICAL DATA:  COUGH AND SOB X A FEW DAYS, HEART  SURGERY IN 2016, HTN, CHF, FORMER SMOKER SINCE 2002 EXAM: CHEST  2 VIEW COMPARISON:  11/19/2016 FINDINGS: Pacer/AICD device. Midline trachea. Moderate cardiomegaly. Small bilateral pleural effusions. No pneumothorax. Pulmonary interstitial prominence and indistinctness. New right base airspace disease. Suspect similar left base subsegmental atelectasis or scarring. Cardiomegaly with small bilateral pleural effusions and mild pulmonary venous congestion. New right base airspace disease, suspicious for infection or aspiration. IMPRESSION: No active cardiopulmonary disease. Electronically Signed: By: Abigail Miyamoto M.D. On: 05/09/2017 12:44   Dg Chest Port 1 View Result Date: 04/16/2017 CLINICAL DATA:  Pneumonia, shortness of breath. EXAM: PORTABLE CHEST 1 VIEW COMPARISON:  Radiograph yesterday FINDINGS: Post median sternotomy with prosthetic valve. Multilead left-sided pacemaker in place. Cardiomegaly again seen. Worsening vascular congestion. Increasing pleural effusions and bibasilar airspace disease, right greater than left. No pneumothorax. IMPRESSION: 1. Findings suggest CHF with increasing vascular congestion and bilateral pleural effusions. 2. Worsening bibasilar airspace disease, right greater than left, pneumonia versus vascular overlap. Electronically Signed   By: Jeb Levering M.D.   On: 05/03/2017 04:24    YIR:SWNIOE flutter with RVR (likely atypical) (personally reviewed)  TELEMETRY: atrial flutter (personally reviewed)  DEVICE HISTORY: STJ CRTD implanted 2015  Assessment/Plan: 1.  Atrial flutter Difficult to tell if typical or not by EKG LA is severely enlarged by last echo Agree with Amiodarone and TEE/DCCV Continue anticoagulation long term for CHADS2VASC of at least 3  2.  Acute on chronic systolic heart failure Worsened by atrial arrhythmia Followed by AHF team Sinus rates chronically elevated per device reports (90-110's), was on Corlanor prior to admission, now  discontinued  3.  RA lead noise Chronic issue but resulting in decreased CRT pacing Will need to consider RA lead revision at some point electively down the road after he recovers from this hospitalization  4.  CAD s/p CABG No recent ischemic symptoms  5.  CKD, stage III Stable No change required today   Signed, Chanetta Marshall, NP 04/30/2017 7:50 AM  EP Attending  Patient seen and examined. Agree with above. His exam has been minimally modified and agrees with my findings. He has symptomatic atrial flutter which by his history began 3-4 days ago. He was found to have an RVR. He was initially blanking one of his flutter waves and pacing near his upper rate. No he is conducting 2:1. He was started on IV amio for rate control and he has been placed on anti-coagulation. He will undergo TEE guided DCCV later today. He has not had syncope but his heart failure symptoms are class 3 when he is in flutter. In addition, he has some noise on his atrial lead which we will try and program around when he gets back to NSR. I would anticipate that he will be transitioned from IV to oral amiodarone after his DCCV/TEE.  Mikle Bosworth.D.

## 2017-05-10 NOTE — Anesthesia Postprocedure Evaluation (Signed)
Anesthesia Post Note  Patient: Dillon Cisneros  Procedure(s) Performed: TRANSESOPHAGEAL ECHOCARDIOGRAM (TEE) (N/A ) CARDIOVERSION (N/A )     Patient location during evaluation: PACU Anesthesia Type: MAC Level of consciousness: awake and alert Pain management: pain level controlled Vital Signs Assessment: post-procedure vital signs reviewed and stable Respiratory status: spontaneous breathing, nonlabored ventilation, respiratory function stable and patient connected to nasal cannula oxygen Cardiovascular status: stable and blood pressure returned to baseline Postop Assessment: no apparent nausea or vomiting Anesthetic complications: no    Last Vitals:  Vitals:   05/07/2017 1420 05/05/2017 1453  BP: (!) 93/50 102/73  Pulse: (!) 101 98  Resp: (!) 23   Temp:    SpO2: 93% 97%    Last Pain:  Vitals:   04/18/2017 1402  TempSrc: Oral  PainSc:                  [name]

## 2017-05-10 NOTE — Progress Notes (Signed)
  We were able to pace Mr Stiff back into NSR using his St Jude device.  He ended up in NSR with BiV pacing and frequent PACs/PVCs.   Loralie Champagne 04/29/2017 1:58 PM

## 2017-05-10 NOTE — Progress Notes (Signed)
PROGRESS NOTE    Dillon Cisneros  ZOX:[id number] DOB: June 22, 1950 DOA: 05/09/2017 PCP: [name], MD  Brief Narrative:  Dillon Cisneros is a 66 y.o. male with ischemic cardiomyopathy with ICD in place and EF noted to be in the 20% range presented to ED at the direction of his heart failure team with shortness of breath for the last 2 days worsening after thanksgiving. He had been seen at primary care doctor office and sent here after discussion with Dr. [name] the heart failure clinic. Has had a shortness of breath with a dry cough for 2 days.  There was concern for pneumonia with his symptoms of persistent white sputum production. He denies fever and chills at this time.   The patient states this feels like this when he had pneumonia in the past. He has chronic biventricular heart failure and chronic pacemaker. He states he gained several pounds over Thanksgiving.  States his legs are more swollen than normal. In particular his left leg is more swollen than baseline.  It tested negative for DVT when checked in ED today.  The patient reports that he feels weak all over. His heart rate has been fast and he does describe them as palpitations. He was seen by heart failure clinic in Nov 2018 and had adjustment of his medications through the heart failure clinic to try and control his heart rate and improve his pacemaker effectiveness but he has been noted in ED to be in afib/flutter which is considered new onset per ED physician.     Cardiology was consulted to see him and they requested that he be admitted to [facility] so that he can be followed by the heart failure team.  The heart failure team was also consulted saw patient this AM.  The patient denies having chest pain but has been increasing productive white frothy sputum and SOB.  Patient to undergo TEE/DCCV Today.  Assessment & Plan:   Principal Problem:   Pneumonia Active Problems:   Hx of CABG   CAD (coronary artery  disease)   HTN (hypertension)   Stage 4 chronic kidney disease (HCC)   HLD (hyperlipidemia)   Chronic combined systolic and diastolic CHF (congestive heart failure) (HCC)   H/O aortic valve replacement   ICD (implantable cardioverter-defibrillator) in place   Acute systolic CHF (congestive heart failure) (HCC)   Hypotension   Research study patient   Atrial fibrillation (HCC)   Elevated brain natriuretic peptide (BNP) level   Aspiration pneumonia (HCC)  Community Acquired Pneumonia -  -blood culture and sputum cultures have been ordered,   -IV ceftriaxone/azithromycin ordered  -Aspiration precautions ordered, elevate head of bed.   -Continue supportive care with oxygen as needed.  I -ncentive spirometry and aspiration precautions ordered.   -Follow clinical course and adjust therapy as needed.    Acute on chronic combined systolic and Diastolic Heart Failure  -Pt notes a weight gain of several pounds in last several days but noted that his weight was 195 when he was seen 11/20 at HF clinic and now it is 197  -Recent ECHO showed EF of 20-25% -Likely worsened by Arrythmia and likely loss of BiV pacing -Admitted for IV diuresis.   -Cardiology service to weigh in on his diuresis in the setting of his chronic hypotension and CKD stage 4.   -Cardiology to do TEE/DCCV today and start IV Lasix after  -Hold Entresto given AKI and Hypotension; Cardiology also holding Coreg  Atrial Flutter with RVR -This appears  to be new onset -  -He has been on coreg and taking it and was started recently on corlanor. -Cardiology consulted and placed patient on Amiodarone gtt and Heparin gtt -CHADS2VASc of at least 3 -Plans for TEE/DCCV today  Chronic hypotension -  -He has very soft to low BPs but has been totally asymptomatic from this and noted to have a chronically low blood pressure.   -Will follow closely.  He is being admitted to stepdown unit because of his low BP in the setting of ongoing  diuresis.   -Hold Coreg and Entresto  AKI on Stage 3 CKD  -Cr worsened and ? Cardiorenal Syndrome -will need to renally dose medications and follow closely in the setting of ongoing diuresis.   -Consider nephrology consult if any decline in renal function.    CAD s/p CABG -  -stable, no chest pain symptoms right now,  -checking troponins for completeness given his SOB symptoms and will have cardiology see the patient in consultation.    -Resume aspirin and atorvastatin.   Gout - -Resume home allopurinol daily.  He is high risk for acute exacerbation in the setting of inpatient diuresis.    LLE edema - [place] of left Lower extremity negative for DVT.    DVT prophylaxis: Heparin gtt Code Status: FULL CODE Family Communication: Discussed with Wife at Bedside Disposition Plan: Remain Inpateint   Consultants:   Cardiology Heart Failure Team  Cardiology EP  Procedures:   TEE/DCCV Scheduled for Today   Antimicrobials:  Anti-infectives (From admission, onward)   Start     Dose/Rate Route Frequency Ordered Stop   05/13/2017 1400  cefTRIAXone (ROCEPHIN) 1 g in dextrose 5 % 50 mL IVPB     1 g 100 mL/hr over 30 Minutes Intravenous Every 24 hours 05/09/17 1742     04/28/2017 1400  azithromycin (ZITHROMAX) 500 mg in dextrose 5 % 250 mL IVPB     500 mg 250 mL/hr over 60 Minutes Intravenous Every 24 hours 05/09/17 1742 05/15/17 1359   05/09/17 1315  cefTRIAXone (ROCEPHIN) 1 g in dextrose 5 % 50 mL IVPB     1 g 100 mL/hr over 30 Minutes Intravenous  Once 05/09/17 1312 05/09/17 1358   05/09/17 1315  azithromycin (ZITHROMAX) 500 mg in dextrose 5 % 250 mL IVPB     500 mg 250 mL/hr over 60 Minutes Intravenous  Once 05/09/17 1312 05/09/17 1505     Subjective: Seen and examined this AM and denied any current CP. Wanting to eat and wanting to get the TEE/DCCV done. No Nausea or vomiting.   Objective: Vitals:   05/09/17 2039 05/09/2017 0400 04/16/2017 0429 04/14/2017 0759  BP: 93/70 (!) 90/58  (!)  88/64  Pulse: (!) 118   (!) 113  Resp: 20   (!) 22  Temp: 98.4 F (36.9 C) 97.9 F (36.6 C)  97.8 F (36.6 C)  TempSrc: Oral   Oral  SpO2: 97%   92%  Weight:   89.3 kg (196 lb 12.8 oz)   Height:        Intake/Output Summary (Last 24 hours) at 04/17/2017 0816 Last data filed at 05/03/2017 0436 Gross per 24 hour  Intake 967.55 ml  Output 500 ml  Net 467.55 ml   Filed Weights   05/09/17 1108 05/09/17 1907 04/26/2017 0429  Weight: 89.4 kg (197 lb) 89 kg (196 lb 3.4 oz) 89.3 kg (196 lb 12.8 oz)   Examination: Physical Exam:  Constitutional: WN/WD Caucasian male in mild  distress and appears calm and comfortable Eyes: Lids and conjunctivae normal, sclerae anicteric  ENMT: External Ears, Nose appear normal. Grossly normal hearing. Mucous membranes are moist.  Neck: Appears normal, supple, no cervical masses, normal ROM, no appreciable thyromegaly; Has some JVD Respiratory: Diminished to auscultation bilaterally, no wheezing, rales, rhonchi or crackles. Normal respiratory effort and patient is not tachypenic. No accessory muscle use.  Cardiovascular: Irregular and Tachy, Has a systolic murmur. S1 and S2 auscultated. Has 1+ LE extremity edema with Left worse than Right.  Abdomen: Soft, non-tender, non-distended. No masses palpated. No appreciable hepatosplenomegaly. Bowel sounds positive x4.  GU: Deferred. Musculoskeletal: No clubbing / cyanosis of digits/nails. No joint deformity upper and lower extremities.  Skin: No rashes, lesions, ulcers on a limited skin eval. No induration; Warm and dry.  Neurologic: CN 2-12 grossly intact with no focal deficits. Romberg sign cerebellar reflexes not assessed.  Psychiatric: Normal judgment and insight. Alert and oriented x 3. Normal mood and appropriate affect.   Data Reviewed: I have personally reviewed following labs and imaging studies  CBC: Recent Labs  Lab 05/09/17 1121 05/11/2017 0530  WBC 8.5 7.7  NEUTROABS 6.8  --   HGB 13.4 12.1*   HCT 43.9 37.6*  MCV 104.8* 100.3*  PLT 155 811*   Basic Metabolic Panel: Recent Labs  Lab 05/09/17 1121  NA 138  K 4.3  CL 101  CO2 24  GLUCOSE 115*  BUN 79*  CREATININE 2.73*  CALCIUM 9.6   GFR: Estimated Creatinine Clearance: 27.6 mL/min (A) (by C-G formula based on SCr of 2.73 mg/dL (H)). Liver Function Tests: Recent Labs  Lab 05/09/17 1121  AST 66*  ALT 65*  ALKPHOS 84  BILITOT 1.0  PROT 7.1  ALBUMIN 4.2   No results for input(s): LIPASE, AMYLASE in the last 168 hours. No results for input(s): AMMONIA in the last 168 hours. Coagulation Profile: No results for input(s): INR, PROTIME in the last 168 hours. Cardiac Enzymes: Recent Labs  Lab 05/09/17 1121 05/09/17 1607  TROPONINI 0.07* 0.07*   BNP (last 3 results) No results for input(s): PROBNP in the last 8760 hours. HbA1C: No results for input(s): HGBA1C in the last 72 hours. CBG: No results for input(s): GLUCAP in the last 168 hours. Lipid Profile: No results for input(s): CHOL, HDL, LDLCALC, TRIG, CHOLHDL, LDLDIRECT in the last 72 hours. Thyroid Function Tests: No results for input(s): TSH, T4TOTAL, FREET4, T3FREE, THYROIDAB in the last 72 hours. Anemia Panel: No results for input(s): VITAMINB12, FOLATE, FERRITIN, TIBC, IRON, RETICCTPCT in the last 72 hours. Sepsis Labs: No results for input(s): PROCALCITON, LATICACIDVEN in the last 168 hours.  Recent Results (from the past 240 hour(s))  Culture, blood (routine x 2) Call MD if unable to obtain prior to antibiotics being given     Status: None (Preliminary result)   Collection Time: 05/09/17  4:14 PM  Result Value Ref Range Status   Specimen Description LEFT ANTECUBITAL  Final   Special Requests   Final    BOTTLES DRAWN AEROBIC AND ANAEROBIC Blood Culture adequate volume   Culture PENDING  Incomplete   Report Status PENDING  Incomplete  Culture, blood (routine x 2) Call MD if unable to obtain prior to antibiotics being given     Status: None  (Preliminary result)   Collection Time: 05/09/17  4:19 PM  Result Value Ref Range Status   Specimen Description BLOOD LEFT ARM  Final   Special Requests   Final    BOTTLES DRAWN  AEROBIC AND ANAEROBIC Blood Culture adequate volume   Culture PENDING  Incomplete   Report Status PENDING  Incomplete    Radiology Studies: Dg Chest 2 View  Addendum Date: 05/09/2017   ADDENDUM REPORT: 05/09/2017 13:19 ADDENDUM: The impression should state: Cardiomegaly with small bilateral pleural effusions and mild pulmonary venous congestion. New right base airspace disease, suspicious for infection or aspiration. Electronically Signed   By: Abigail Miyamoto M.D.   On: 05/09/2017 13:19   Result Date: 05/09/2017 CLINICAL DATA:  COUGH AND SOB X A FEW DAYS, HEART SURGERY IN 2016, HTN, CHF, FORMER SMOKER SINCE 2002 EXAM: CHEST  2 VIEW COMPARISON:  11/19/2016 FINDINGS: Pacer/AICD device. Midline trachea. Moderate cardiomegaly. Small bilateral pleural effusions. No pneumothorax. Pulmonary interstitial prominence and indistinctness. New right base airspace disease. Suspect similar left base subsegmental atelectasis or scarring. Cardiomegaly with small bilateral pleural effusions and mild pulmonary venous congestion. New right base airspace disease, suspicious for infection or aspiration. IMPRESSION: No active cardiopulmonary disease. Electronically Signed: By: Abigail Miyamoto M.D. On: 05/09/2017 12:44   US Venous Img Lower Unilateral Left  Result Date: 05/09/2017 CLINICAL DATA:  Left lower extremity pain and edema for the past 2 days. Former smoker. History of skin cancer and bilateral total knee replacements. Evaluate for DVT. EXAM: LEFT LOWER EXTREMITY VENOUS DOPPLER ULTRASOUND TECHNIQUE: Gray-scale sonography with graded compression, as well as color Doppler and duplex ultrasound were performed to evaluate the lower extremity deep venous systems from the level of the common femoral vein and including the common femoral, femoral,  profunda femoral, popliteal and calf veins including the posterior tibial, peroneal and gastrocnemius veins when visible. The superficial great saphenous vein was also interrogated. Spectral Doppler was utilized to evaluate flow at rest and with distal augmentation maneuvers in the common femoral, femoral and popliteal veins. COMPARISON:  None. FINDINGS: Contralateral Common Femoral Vein: Respiratory phasicity is normal and symmetric with the symptomatic side. No evidence of thrombus. Normal compressibility. Common Femoral Vein: No evidence of thrombus. Normal compressibility, respiratory phasicity and response to augmentation. Saphenofemoral Junction: No evidence of thrombus. Normal compressibility and flow on color Doppler imaging. Profunda Femoral Vein: No evidence of thrombus. Normal compressibility and flow on color Doppler imaging. Femoral Vein: No evidence of thrombus. Normal compressibility, respiratory phasicity and response to augmentation. Popliteal Vein: No evidence of thrombus. Normal compressibility, respiratory phasicity and response to augmentation. Calf Veins: No evidence of thrombus. Normal compressibility and flow on color Doppler imaging. Superficial Great Saphenous Vein: No evidence of thrombus. Normal compressibility. Venous Reflux:  None. Other Findings: Pulsatile flow was demonstrated throughout the entirety the interrogated portions of the left lower extremity venous system as well as the contralateral right common femoral vein. IMPRESSION: 1. No evidence of DVT within either lower extremity. 2. Pulsatile flow demonstrated throughout the interrogated portions of the left lower extremity venous system as well as the contralateral right common femoral vein, nonspecific though could be seen in the setting of right-sided heart failure/congestive heart failure. Clinical correlation is advised. Electronically Signed   By: Sandi Mariscal M.D.   On: 05/09/2017 13:16   Dg Chest Port 1 View  Result  Date: 04/24/2017 CLINICAL DATA:  Pneumonia, shortness of breath. EXAM: PORTABLE CHEST 1 VIEW COMPARISON:  Radiograph yesterday FINDINGS: Post median sternotomy with prosthetic valve. Multilead left-sided pacemaker in place. Cardiomegaly again seen. Worsening vascular congestion. Increasing pleural effusions and bibasilar airspace disease, right greater than left. No pneumothorax. IMPRESSION: 1. Findings suggest CHF with increasing vascular congestion and bilateral pleural  effusions. 2. Worsening bibasilar airspace disease, right greater than left, pneumonia versus vascular overlap. Electronically Signed   By: Jeb Levering M.D.   On: 04/18/2017 04:24   Scheduled Meds: . allopurinol  300 mg Oral Daily  . aspirin EC  81 mg Oral Daily  . atorvastatin  40 mg Oral q1800  . furosemide  80 mg Intravenous BID  . multivitamin with minerals  1 tablet Oral Daily  . sodium chloride flush  3 mL Intravenous Q12H  . sodium chloride flush  3 mL Intravenous Q12H   Continuous Infusions: . sodium chloride    . sodium chloride    . amiodarone 30 mg/hr (05/11/2017 0315)  . azithromycin    . cefTRIAXone (ROCEPHIN)  IV    . heparin 1,200 Units/hr (05/09/17 2130)    LOS: 1 day   Kerney Elbe, DO [facility] Pager [phone]  If 7PM-7AM, please contact night-coverage [email or web address] Password [email or web address] 05/11/2017, 8:16 AM

## 2017-05-10 NOTE — Anesthesia Preprocedure Evaluation (Addendum)
Anesthesia Evaluation  Patient identified by MRN, date of birth, ID band Patient awake    Reviewed: Allergy & Precautions, NPO status , Patient's Chart, lab work & pertinent test results  Airway Mallampati: II  TM Distance: >3 FB Neck ROM: Full    Dental no notable dental hx.    Pulmonary former smoker,    Pulmonary exam normal breath sounds clear to auscultation       Cardiovascular hypertension, + CAD, + CABG and +CHF  Normal cardiovascular exam+ dysrhythmias Atrial Fibrillation  Rhythm:Regular Rate:Normal  S/p AVR 2017   Neuro/Psych negative neurological ROS  negative psych ROS   GI/Hepatic negative GI ROS, Neg liver ROS,   Endo/Other  negative endocrine ROS  Renal/GU CRFRenal disease  negative genitourinary   Musculoskeletal negative musculoskeletal ROS (+)   Abdominal   Peds negative pediatric ROS (+)  Hematology negative hematology ROS (+)   Anesthesia Other Findings   Reproductive/Obstetrics negative OB ROS                             Anesthesia Physical Anesthesia Plan  ASA: III  Anesthesia Plan: MAC   Post-op Pain Management:    Induction: Intravenous  PONV Risk Score and Plan: 0 and Treatment may vary due to age or medical condition  Airway Management Planned: Simple Face Mask and Natural Airway  Additional Equipment:   Intra-op Plan:   Post-operative Plan:   Informed Consent: I have reviewed the patients History and Physical, chart, labs and discussed the procedure including the risks, benefits and alternatives for the proposed anesthesia with the patient or authorized representative who has indicated his/her understanding and acceptance.   Dental advisory given  Plan Discussed with: CRNA  Anesthesia Plan Comments:        Anesthesia Quick Evaluation

## 2017-05-10 NOTE — Progress Notes (Signed)
Attempted arterial line twice but unable to thread catheter. MD aware.

## 2017-05-11 DIAGNOSIS — R570 Cardiogenic shock: Secondary | ICD-10-CM

## 2017-05-11 DIAGNOSIS — I480 Paroxysmal atrial fibrillation: Secondary | ICD-10-CM

## 2017-05-11 DIAGNOSIS — I5023 Acute on chronic systolic (congestive) heart failure: Secondary | ICD-10-CM

## 2017-05-11 DIAGNOSIS — Z951 Presence of aortocoronary bypass graft: Secondary | ICD-10-CM

## 2017-05-11 DIAGNOSIS — I5042 Chronic combined systolic (congestive) and diastolic (congestive) heart failure: Secondary | ICD-10-CM

## 2017-05-11 LAB — COMPREHENSIVE METABOLIC PANEL
ALBUMIN: 3.3 g/dL — AB (ref 3.5–5.0)
ALK PHOS: 64 U/L (ref 38–126)
ALT: 44 U/L (ref 17–63)
AST: 28 U/L (ref 15–41)
Anion gap: 10 (ref 5–15)
BUN: 84 mg/dL — AB (ref 6–20)
CALCIUM: 8.6 mg/dL — AB (ref 8.9–10.3)
CHLORIDE: 102 mmol/L (ref 101–111)
CO2: 23 mmol/L (ref 22–32)
CREATININE: 2.75 mg/dL — AB (ref 0.61–1.24)
GFR calc Af Amer: 26 mL/min — ABNORMAL LOW (ref >60)
GFR calc non Af Amer: 22 mL/min — ABNORMAL LOW (ref >60)
GLUCOSE: 141 mg/dL — AB (ref 65–99)
Potassium: 3.4 mmol/L — ABNORMAL LOW (ref 3.5–5.1)
SODIUM: 135 mmol/L (ref 135–145)
Total Bilirubin: 0.7 mg/dL (ref 0.3–1.2)
Total Protein: 5.8 g/dL — ABNORMAL LOW (ref 6.5–8.1)

## 2017-05-11 LAB — MAGNESIUM
MAGNESIUM: 2.5 mg/dL — AB (ref 1.7–2.4)
Magnesium: 2.6 mg/dL — ABNORMAL HIGH (ref 1.7–2.4)

## 2017-05-11 LAB — BRAIN NATRIURETIC PEPTIDE: B Natriuretic Peptide: 1412.5 pg/mL — ABNORMAL HIGH (ref 0.0–100.0)

## 2017-05-11 LAB — CBC
HEMATOCRIT: 36.1 % — AB (ref 39.0–52.0)
HEMOGLOBIN: 11.7 g/dL — AB (ref 13.0–17.0)
MCH: 32.2 pg (ref 26.0–34.0)
MCHC: 32.4 g/dL (ref 30.0–36.0)
MCV: 99.4 fL (ref 78.0–100.0)
Platelets: 128 10*3/uL — ABNORMAL LOW (ref 150–400)
RBC: 3.63 MIL/uL — AB (ref 4.22–5.81)
RDW: 16.2 % — ABNORMAL HIGH (ref 11.5–15.5)
WBC: 8.1 10*3/uL (ref 4.0–10.5)

## 2017-05-11 LAB — COOXEMETRY PANEL
Carboxyhemoglobin: 1 % (ref 0.5–1.5)
METHEMOGLOBIN: 1.2 % (ref 0.0–1.5)
O2 SAT: 71.5 %
TOTAL HEMOGLOBIN: 11.8 g/dL — AB (ref 12.0–16.0)

## 2017-05-11 LAB — BASIC METABOLIC PANEL
Anion gap: 10 (ref 5–15)
BUN: 78 mg/dL — ABNORMAL HIGH (ref 6–20)
CALCIUM: 8.8 mg/dL — AB (ref 8.9–10.3)
CHLORIDE: 100 mmol/L — AB (ref 101–111)
CO2: 23 mmol/L (ref 22–32)
Creatinine, Ser: 2.53 mg/dL — ABNORMAL HIGH (ref 0.61–1.24)
GFR calc Af Amer: 28 mL/min — ABNORMAL LOW (ref >60)
GFR, EST NON AFRICAN AMERICAN: 25 mL/min — AB (ref >60)
GLUCOSE: 140 mg/dL — AB (ref 65–99)
POTASSIUM: 3.6 mmol/L (ref 3.5–5.1)
Sodium: 133 mmol/L — ABNORMAL LOW (ref 135–145)

## 2017-05-11 LAB — HEPARIN LEVEL (UNFRACTIONATED): Heparin Unfractionated: 0.25 IU/mL — ABNORMAL LOW (ref 0.30–0.70)

## 2017-05-11 LAB — PHOSPHORUS: Phosphorus: 5 mg/dL — ABNORMAL HIGH (ref 2.5–4.6)

## 2017-05-11 MED ORDER — METOLAZONE 2.5 MG PO TABS
2.5000 mg | ORAL_TABLET | Freq: Once | ORAL | Status: AC
  Administered 2017-05-11: 2.5 mg via ORAL
  Filled 2017-05-11: qty 1

## 2017-05-11 MED ORDER — GUAIFENESIN ER 600 MG PO TB12
600.0000 mg | ORAL_TABLET | Freq: Two times a day (BID) | ORAL | Status: DC
  Administered 2017-05-11 – 2017-05-19 (×18): 600 mg via ORAL
  Filled 2017-05-11 (×18): qty 1

## 2017-05-11 MED ORDER — POTASSIUM CHLORIDE CRYS ER 20 MEQ PO TBCR
40.0000 meq | EXTENDED_RELEASE_TABLET | Freq: Two times a day (BID) | ORAL | Status: DC
  Administered 2017-05-11 (×2): 40 meq via ORAL
  Filled 2017-05-11 (×2): qty 2

## 2017-05-11 NOTE — Progress Notes (Signed)
[place] for heparin Indication: atrial fibrillation  Allergies  Allergen Reactions  . Penicillins Hives    Has patient had a PCN reaction causing immediate rash, facial/tongue/throat swelling, SOB or lightheadedness with hypotension: No Has patient had a PCN reaction causing severe rash involving mucus membranes or skin necrosis: No Has patient had a PCN reaction that required hospitalization: No Has patient had a PCN reaction occurring within the last 10 years: No If all of the above answers are "NO", then may proceed with Cephalosporin use.   . Nsaids     Should avoid    Patient Measurements: Height: 5\' 7"  (170.2 cm) Weight: 213 lb 10 oz (96.9 kg) IBW/kg (Calculated) : 66.1 Heparin Dosing Weight: 84.5kg  Vital Signs: Temp: 98.2 F (36.8 C) (11/28 0445) Temp Source: Oral (11/28 0445) BP: 86/66 (11/28 0515) Pulse Rate: 101 (11/28 0515)  Labs: Recent Labs    05/09/17 1121 05/09/17 1607 04/26/2017 0530 04/26/2017 0827 04/20/2017 1447 05/11/17 0357  HGB 13.4  --  12.1*  --   --  11.7*  HCT 43.9  --  37.6*  --   --  36.1*  PLT 155  --  132*  --   --  128*  HEPARINUNFRC  --   --  0.33  --  0.33 0.25*  CREATININE 2.73*  --   --  2.89*  --  2.75*  TROPONINI 0.07* 0.07*  --   --   --   --     Estimated Creatinine Clearance: 28.5 mL/min (A) (by C-G formula based on SCr of 2.75 mg/dL (H)).  Assessment: 66 y.o. male with Aflutter s/p cardioversion for heparin  Goal of Therapy:  Heparin level 0.3-0.7 units/ml Monitor platelets by anticoagulation protocol: Yes   Plan:  Increase Heparin 1300 units/hr  Phillis Knack, PharmD, BCPS  05/11/2017,5:28 AM

## 2017-05-11 NOTE — Progress Notes (Signed)
Electrophysiology Rounding Note  Patient Name: Dillon Cisneros Date of Encounter: 05/11/2017  Primary Cardiologist: Aundra Dubin Electrophysiologist: Lovena Le   Subjective   The patient is feeling better from yesterday. Transferred to unit yesterday for hypotension and shortness of breath.   Inpatient Medications    Scheduled Meds: . allopurinol  300 mg Oral Daily  . aspirin EC  81 mg Oral Daily  . atorvastatin  40 mg Oral q1800  . Chlorhexidine Gluconate Cloth  6 each Topical Daily  . furosemide  80 mg Intravenous BID  . GALACTIC Study - Placebo / omecamtiv mecarbil ([facility])  1 tablet Oral BID  . mouth rinse  15 mL Mouth Rinse BID  . multivitamin with minerals  1 tablet Oral Daily  . potassium chloride  40 mEq Oral BID  . sodium chloride flush  10-40 mL Intracatheter Q12H  . sodium chloride flush  3 mL Intravenous Q12H  . sodium chloride flush  3 mL Intravenous Q12H   Continuous Infusions: . sodium chloride    . sodium chloride    . sodium chloride    . amiodarone 60 mg/hr (05/11/17 0400)  . azithromycin Stopped (04/24/2017 1727)  . cefTRIAXone (ROCEPHIN)  IV    . DOBUTamine 2.5 mcg/kg/min (04/18/2017 2000)  . heparin 1,300 Units/hr (05/11/17 0531)  . norepinephrine (LEVOPHED) Adult infusion 8 mcg/min (05/11/17 0135)   PRN Meds: sodium chloride, Place/Maintain arterial line **AND** sodium chloride, acetaminophen, nitroGLYCERIN, ondansetron (ZOFRAN) IV, sodium chloride flush, sodium chloride flush, sodium chloride flush   Vital Signs    Vitals:   05/11/17 0615 05/11/17 0630 05/11/17 0645 05/11/17 0700  BP: (!) 116/95 (!) 92/54 (!) 90/57 97/64  Pulse: 98 96 (!) 57 (!) 44  Resp: 19 13 16 12   Temp:      TempSrc:      SpO2: 95% 94% 94% 93%  Weight:      Height:        Intake/Output Summary (Last 24 hours) at 05/11/2017 0730 Last data filed at 05/11/2017 0700 Gross per 24 hour  Intake 2015.73 ml  Output 500 ml  Net 1515.73 ml   Filed Weights   05/02/2017 0429  05/06/2017 1830 05/11/17 0500  Weight: 196 lb 12.8 oz (89.3 kg) 213 lb 6.5 oz (96.8 kg) 213 lb 10 oz (96.9 kg)    Physical Exam    GEN- The patient is ill appearing, alert and oriented x 3 today.   Head- normocephalic, atraumatic Eyes-  Sclera clear, conjunctiva pink Ears- hearing intact Oropharynx- clear Neck- supple Lungs- Clear to ausculation bilaterally, normal work of breathing Heart- Regular rate and rhythm  GI- soft, NT, ND, + BS Extremities- no clubbing, cyanosis, or edema Skin- no rash or lesion Psych- euthymic mood, full affect Neuro- strength and sensation are intact  Labs    CBC Recent Labs    05/09/17 1121 05/04/2017 0530 05/11/17 0357  WBC 8.5 7.7 8.1  NEUTROABS 6.8  --   --   HGB 13.4 12.1* 11.7*  HCT 43.9 37.6* 36.1*  MCV 104.8* 100.3* 99.4  PLT 155 132* 638*   Basic Metabolic Panel Recent Labs    04/15/2017 0827 05/11/17 0357  NA 135 135  K 4.0 3.4*  CL 103 102  CO2 20* 23  GLUCOSE 121* 141*  BUN 88* 84*  CREATININE 2.89* 2.75*  CALCIUM 8.8* 8.6*  MG 2.6* 2.6*  PHOS 5.3* 5.0*   Liver Function Tests Recent Labs    04/20/2017 0827 05/11/17 0357  AST 37 28  ALT 52 44  ALKPHOS 68 64  BILITOT 0.7 0.7  PROT 6.3* 5.8*  ALBUMIN 3.6 3.3*   Cardiac Enzymes Recent Labs    05/09/17 1121 05/09/17 1607  TROPONINI 0.07* 0.07*     Telemetry    Sinus rhythm, V pacing, frequent ventricular ectopy  (personally reviewed)  Radiology    Dg Chest 2 View  Addendum Date: 05/09/2017   ADDENDUM REPORT: 05/09/2017 13:19 ADDENDUM: The impression should state: Cardiomegaly with small bilateral pleural effusions and mild pulmonary venous congestion. New right base airspace disease, suspicious for infection or aspiration. Electronically Signed   By: Abigail Miyamoto M.D.   On: 05/09/2017 13:19   Result Date: 05/09/2017 CLINICAL DATA:  COUGH AND SOB X A FEW DAYS, HEART SURGERY IN 2016, HTN, CHF, FORMER SMOKER SINCE 2002 EXAM: CHEST  2 VIEW COMPARISON:   11/19/2016 FINDINGS: Pacer/AICD device. Midline trachea. Moderate cardiomegaly. Small bilateral pleural effusions. No pneumothorax. Pulmonary interstitial prominence and indistinctness. New right base airspace disease. Suspect similar left base subsegmental atelectasis or scarring. Cardiomegaly with small bilateral pleural effusions and mild pulmonary venous congestion. New right base airspace disease, suspicious for infection or aspiration. IMPRESSION: No active cardiopulmonary disease. Electronically Signed: By: Abigail Miyamoto M.D. On: 05/09/2017 12:44   US Venous Img Lower Unilateral Left  Result Date: 05/09/2017 CLINICAL DATA:  Left lower extremity pain and edema for the past 2 days. Former smoker. History of skin cancer and bilateral total knee replacements. Evaluate for DVT. EXAM: LEFT LOWER EXTREMITY VENOUS DOPPLER ULTRASOUND TECHNIQUE: Gray-scale sonography with graded compression, as well as color Doppler and duplex ultrasound were performed to evaluate the lower extremity deep venous systems from the level of the common femoral vein and including the common femoral, femoral, profunda femoral, popliteal and calf veins including the posterior tibial, peroneal and gastrocnemius veins when visible. The superficial great saphenous vein was also interrogated. Spectral Doppler was utilized to evaluate flow at rest and with distal augmentation maneuvers in the common femoral, femoral and popliteal veins. COMPARISON:  None. FINDINGS: Contralateral Common Femoral Vein: Respiratory phasicity is normal and symmetric with the symptomatic side. No evidence of thrombus. Normal compressibility. Common Femoral Vein: No evidence of thrombus. Normal compressibility, respiratory phasicity and response to augmentation. Saphenofemoral Junction: No evidence of thrombus. Normal compressibility and flow on color Doppler imaging. Profunda Femoral Vein: No evidence of thrombus. Normal compressibility and flow on color Doppler  imaging. Femoral Vein: No evidence of thrombus. Normal compressibility, respiratory phasicity and response to augmentation. Popliteal Vein: No evidence of thrombus. Normal compressibility, respiratory phasicity and response to augmentation. Calf Veins: No evidence of thrombus. Normal compressibility and flow on color Doppler imaging. Superficial Great Saphenous Vein: No evidence of thrombus. Normal compressibility. Venous Reflux:  None. Other Findings: Pulsatile flow was demonstrated throughout the entirety the interrogated portions of the left lower extremity venous system as well as the contralateral right common femoral vein. IMPRESSION: 1. No evidence of DVT within either lower extremity. 2. Pulsatile flow demonstrated throughout the interrogated portions of the left lower extremity venous system as well as the contralateral right common femoral vein, nonspecific though could be seen in the setting of right-sided heart failure/congestive heart failure. Clinical correlation is advised. Electronically Signed   By: Sandi Mariscal M.D.   On: 05/09/2017 13:16   Dg Chest Port 1 View  Result Date: 04/22/2017 CLINICAL DATA:  Acute on chronic systolic heart failure. EXAM: PORTABLE CHEST 1 VIEW COMPARISON:  04/16/2017 FINDINGS: Again noted is a biventricular ICD. Heart  remains enlarged. Persistent bibasilar chest densities. Right jugular central line in the SVC region. Negative for a pneumothorax. Enlargement of the central vascular structures. Right perihilar densities. IMPRESSION: Cardiomegaly with asymmetric lung densities which are suggestive for pulmonary edema. These findings are similar to the recent comparison examination. Basilar chest densities are suggestive for pleural fluid and consolidation. New central line with the tip in the SVC region. Negative for a pneumothorax. Electronically Signed   By: Markus Daft M.D.   On: 04/18/2017 18:22   Dg Chest Port 1 View  Result Date: 05/13/2017 CLINICAL DATA:   Pneumonia, shortness of breath. EXAM: PORTABLE CHEST 1 VIEW COMPARISON:  Radiograph yesterday FINDINGS: Post median sternotomy with prosthetic valve. Multilead left-sided pacemaker in place. Cardiomegaly again seen. Worsening vascular congestion. Increasing pleural effusions and bibasilar airspace disease, right greater than left. No pneumothorax. IMPRESSION: 1. Findings suggest CHF with increasing vascular congestion and bilateral pleural effusions. 2. Worsening bibasilar airspace disease, right greater than left, pneumonia versus vascular overlap. Electronically Signed   By: Jeb Levering M.D.   On: 05/02/2017 04:24    Assessment & Plan    1.  Atrial flutter Difficult to tell if typical or not by EKG LA is severely enlarged by last echo Maintaining SR post TEE/DCCV Continue IV amiodarone for at least another 48 hours with frequent ventricular ectopy Continue anticoagulation long term for CHADS2VASC of at least 3  2.  Acute on chronic systolic heart failure Managed by AHF team Now on pressor support   3.  RA lead noise Chronic issue but resulting in decreased CRT pacing Will need to consider RA lead revision at some point electively down the road after he recovers from this hospitalization  4.  CAD s/p CABG No recent ischemic symptoms  5.  CKD, stage III Stable No change required today  6.  Frequent ventricular ectopy Continue IV amiodarone Keep K >3.9, Mg >1.8 Could consider addition of lidocaine/mexiletine if sustained ventricular ectopy   Dr Lovena Le to see later today  Signed, Chanetta Marshall, NP  05/11/2017, 7:30 AM   EP Attending  Patient seen and examined. Agree with above. The patient developed hypotension and required IV ionotropes after TEE/DCCV yesterday. He is improved today with cardiac output much better based on CO-Ox. Also CVP is improved. Hopefully we can wean ionotropic support sooner than later. I cannot tell what his rhythm is currently. Will plan to  recheck tomorrow if still unable to tell. I would consider IV amio as long as pressure tolerates.  Mikle Bosworth.D.

## 2017-05-11 NOTE — Progress Notes (Signed)
Patient ID: Dillon Cisneros, male   DOB: [birth year], 66 y.o.   MRN: [id number]     Advanced Heart Failure Rounding Note  PCP: Meda Coffee Cardiologist: Aundra Dubin  Subjective:   11/27 S/P TEE-guided DCCV from flutter to NSR with improved BiV pacing.   Yesterday developed acute dyspnea and dizziness. Frequent PVCs. He was hypotensive. Amio increased to 60 mg per hour, norepi added and increased to 8 mcg, and dobutamine 2.5 mcg started with low co-ox.   Todays CO-OX is 71%, CVP down from 22 to 13.  He says urine output is picking up.   Overall feeling better. Denies SOB/CP.   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   Objective:   Weight Range: 213 lb 10 oz (96.9 kg) Body mass index is 33.46 kg/m.   Vital Signs:   Temp:  [97.1 F (36.2 C)-98.2 F (36.8 C)] 98.2 F (36.8 C) (11/28 0445) Pulse Rate:  [25-114] 44 (11/28 0700) Resp:  [12-26] 12 (11/28 0700) BP: (76-157)/(50-142) 97/64 (11/28 0700) SpO2:  [88 %-97 %] 93 % (11/28 0700) Weight:  [213 lb 6.5 oz (96.8 kg)-213 lb 10 oz (96.9 kg)] 213 lb 10 oz (96.9 kg) (11/28 0500) Last BM Date: (pta)  Weight change: Filed Weights   04/14/2017 0429 04/21/2017 1830 05/11/17 0500  Weight: 196 lb 12.8 oz (89.3 kg) 213 lb 6.5 oz (96.8 kg) 213 lb 10 oz (96.9 kg)    Intake/Output:   Intake/Output Summary (Last 24 hours) at 05/11/2017 0718 Last data filed at 05/11/2017 0700 Gross per 24 hour  Intake 2015.73 ml  Output 500 ml  Net 1515.73 ml      Physical Exam   CVP 13 General:  Well appearing. No resp difficulty. In bed  HEENT: normal. RIJ  Neck: supple. JVP to jaw. Carotids 2+ bilat; no bruits. No lymphadenopathy or thryomegaly appreciated. Cor: PMI nondisplaced. Irregular rate & rhythm. No rubs, gallops or murmurs. Lungs: clear Abdomen: soft, nontender, nondistended. No  hepatosplenomegaly. No bruits or masses. Good bowel sounds. Extremities: no cyanosis, clubbing, rash, R and LLE trace edema.  Neuro: alert & orientedx3, cranial nerves grossly intact. moves all 4 extremities w/o difficulty. Affect pleasant    Telemetry   Biv pacing with frequent PVCs NSVT   Labs    CBC Recent Labs    05/09/17 1121 05/09/2017 0530 05/11/17 0357  WBC 8.5 7.7 8.1  NEUTROABS 6.8  --   --   HGB 13.4 12.1* 11.7*  HCT 43.9 37.6* 36.1*  MCV 104.8* 100.3* 99.4  PLT 155 132* 956*   Basic Metabolic Panel Recent Labs    04/23/2017 0827 05/11/17 0357  NA 135 135  K 4.0 3.4*  CL 103 102  CO2 20* 23  GLUCOSE 121* 141*  BUN 88* 84*  CREATININE 2.89* 2.75*  CALCIUM 8.8* 8.6*  MG 2.6* 2.6*  PHOS 5.3* 5.0*   Liver Function Tests Recent Labs    04/27/2017 0827 05/11/17 0357  AST 37 28  ALT 52 44  ALKPHOS 68 64  BILITOT 0.7 0.7  PROT 6.3* 5.8*  ALBUMIN 3.6 3.3*   No results for input(s): LIPASE, AMYLASE in the last 72 hours. Cardiac Enzymes Recent Labs    05/09/17 1121 05/09/17 1607  TROPONINI 0.07* 0.07*    BNP: BNP (last 3 results) Recent Labs    11/19/16 0916 05/09/17 1215 05/11/17 0357  BNP 1,536.0* 1,626.0*  1,412.5*    ProBNP (last 3 results) No results for input(s): PROBNP in the last 8760 hours.   D-Dimer No results for input(s): DDIMER in the last 72 hours. Hemoglobin A1C No results for input(s): HGBA1C in the last 72 hours. Fasting Lipid Panel No results for input(s): CHOL, HDL, LDLCALC, TRIG, CHOLHDL, LDLDIRECT in the last 72 hours. Thyroid Function Tests No results for input(s): TSH, T4TOTAL, T3FREE, THYROIDAB in the last 72 hours.  Invalid input(s): FREET3  Other results:   Imaging    Dg Chest Port 1 View  Result Date: 04/23/2017 CLINICAL DATA:  Acute on chronic systolic heart failure. EXAM: PORTABLE CHEST 1 VIEW COMPARISON:  04/17/2017 FINDINGS: Again noted is a biventricular ICD. Heart remains enlarged. Persistent  bibasilar chest densities. Right jugular central line in the SVC region. Negative for a pneumothorax. Enlargement of the central vascular structures. Right perihilar densities. IMPRESSION: Cardiomegaly with asymmetric lung densities which are suggestive for pulmonary edema. These findings are similar to the recent comparison examination. Basilar chest densities are suggestive for pleural fluid and consolidation. New central line with the tip in the SVC region. Negative for a pneumothorax. Electronically Signed   By: Markus Daft M.D.   On: 04/30/2017 18:22     Medications:     Scheduled Medications: . allopurinol  300 mg Oral Daily  . aspirin EC  81 mg Oral Daily  . atorvastatin  40 mg Oral q1800  . Chlorhexidine Gluconate Cloth  6 each Topical Daily  . furosemide  80 mg Intravenous BID  . GALACTIC Study - Placebo / omecamtiv mecarbil ([facility])  1 tablet Oral BID  . mouth rinse  15 mL Mouth Rinse BID  . multivitamin with minerals  1 tablet Oral Daily  . potassium chloride  40 mEq Oral BID  . sodium chloride flush  10-40 mL Intracatheter Q12H  . sodium chloride flush  3 mL Intravenous Q12H  . sodium chloride flush  3 mL Intravenous Q12H    Infusions: . sodium chloride    . sodium chloride    . sodium chloride    . amiodarone 60 mg/hr (05/11/17 0400)  . azithromycin Stopped (04/22/2017 1727)  . cefTRIAXone (ROCEPHIN)  IV    . DOBUTamine 2.5 mcg/kg/min (05/11/2017 2000)  . heparin 1,300 Units/hr (05/11/17 0531)  . norepinephrine (LEVOPHED) Adult infusion 8 mcg/min (05/11/17 0135)    PRN Medications: sodium chloride, Place/Maintain arterial line **AND** sodium chloride, acetaminophen, nitroGLYCERIN, ondansetron (ZOFRAN) IV, sodium chloride flush, sodium chloride flush, sodium chloride flush    Patient Profile   66 yo with history of CAD, aortic and mitral valve disease, ischemic cardiomyopathy, and CKD stage 3 was admitted with acute on chronic systolic CHF, ?PNA, AKI on CKD, and  atrial flutter.   Assessment/Plan  1. Cardiogenic Shock: Started on norepi and dobutamine. SBP/MAP improving. Co-OX improved to 71%.  Continue current pressors. Try to wean norepi if able today.  2. Acute on chronic systolic CHF: Ischemic cardiomyopathy.  EF 20-25% on 6/18 echo.  St Jude CRT-D device.  - Volume status improving. CVP 13. Continue 80 mg IV lasix twice daily. Start 40 meq K twice a day.  - No Entresto with AKI and low BP.  Will hold Coreg with acute exacerbation.   - Did not tolerate spironolactone in the past with hyperkalemia.   - Continue Galactic study drug.  - At baseline, percentage of BiV pacing is below goal.  Likely due to mode switching with baseline sinus tachy and faulty atrial  lead.  May need atrial lead re-worked eventually.  3. CAD: S/p CABG.  No chest pain.  TnI mildly elevated with no trend, likely due to demand ischemia/volume overload. No chest pain.  - Continue ASA 81.  - Continue atorvastatin 40 daily.  4. Bioprosthetic aortic valve: Stable on last echo.  5. S/p mitral valve repair: Stable on last echo.  6. AKI on CKD stage 3.  Baseline creatinine 1.9.  Todays creatinine 2.75, down from 2.89.  7. Atrial flutter: New for him, with RVR.  Has triggered CHF and likely AKI.  - S/P TEE-guided DC-CV BiV pacing with frequent PVCs NSVT. Continue amio drip.  - He is on heparin gtt, will need transition to [place] eventually.  - I called EP to evaluate today.   8. ?PNA: Bibasilar airspace disease.  No fever and normal WBCs.  He is on azithromycin/ceftriaxone empirically.  9. Frequent PVCs/NSVT: Continue amio drip. Supp K  Mag 2.6 EP will evaluate today.   Length of Stay: 2  Darrick Grinder, NP  05/11/2017, 7:18 AM  Advanced Heart Failure Team Pager [phone] (M-F; 7a - 4p)  Please contact [facility] for night-coverage after hours (4p -7a ) and weekends on [email or web address]  Patient seen with NP, agree with the above note. I have made adjustments to reflect my thoughts.  He had TEE-guided DCCV from flutter to NSR yesterday with restoration of BiV pacing.  However, still having frequent ventricular ectopy.  He developed cardiogenic shock requiring norepinephrine and dobutamine, currently on norepinephrine 8 + dobutamine 2.5.  CVP down from 22 => 13 today.  Diuresing with IV Lasix.    On exam, he remains volume overloaded with JVD and peripheral edema.  2/6 HSM apex.    Co-ox up to 71% today.  Continue dobutamine gtt today, titrate down norepinephrine as able.  Continue Galactic study drug.    He will need continued diuresis with Lasix 80 mg IV bid, UOP starting to pick up with improved cardiac output.  Creatinine down from 2.89 to 2.75.    He remains in NSR on amiodarone, will continue at 60 mg/hr for now with frequent ectopy on inotrope/pressor.  Hopefully weaning pressor will help with this.   Long-term, I am very concerned about Mr Aber.  Renal function will limit his options for advanced therapies.  Would need considerable improvement in creatinine to consider LVAD.   CRITICAL CARE Performed by: Loralie Champagne  Total critical care time: 40 minutes  Critical care time was exclusive of separately billable procedures and treating other patients.  Critical care was necessary to treat or prevent imminent or life-threatening deterioration.  Critical care was time spent personally by me on the following activities: development of treatment plan with patient and/or surrogate as well as nursing, discussions with consultants, evaluation of patient's response to treatment, examination of patient, obtaining history from patient or surrogate, ordering and performing treatments and interventions, ordering and review of laboratory studies, ordering and review of radiographic studies, pulse oximetry and re-evaluation of patient's condition.  Loralie Champagne 05/11/2017 8:14 AM

## 2017-05-11 NOTE — Progress Notes (Signed)
PROGRESS NOTE    Dillon Cisneros  TGG:[id number] DOB: January 20, 1951 DOA: 05/09/2017 PCP: [name], MD  Brief Narrative:  Dillon Cisneros is a 66 y.o. male with ischemic cardiomyopathy with ICD in place and EF noted to be in the 20% range presented to ED at the direction of his heart failure team with shortness of breath for the last 2 days worsening after thanksgiving. He had been seen at primary care doctor office and sent here after discussion with Dr. [name] the heart failure clinic. Has had a shortness of breath with a dry cough for 2 days.  There was concern for pneumonia with his symptoms of persistent white sputum production. He denies fever and chills at this time.   The patient states this feels like this when he had pneumonia in the past. He has chronic biventricular heart failure and chronic pacemaker. He states he gained several pounds over Thanksgiving.  States his legs are more swollen than normal. In particular his left leg is more swollen than baseline.  It tested negative for DVT when checked in ED today.  The patient reports that he feels weak all over. His heart rate has been fast and he does describe them as palpitations. He was seen by heart failure clinic in Nov 2018 and had adjustment of his medications through the heart failure clinic to try and control his heart rate and improve his pacemaker effectiveness but he has been noted in ED to be in afib/flutter which is considered new onset per ED physician.     Cardiology was consulted to see him and they requested that he be admitted to [facility] so that he can be followed by the heart failure team.  The heart failure team was also consulted saw patient this AM.  The patient denies having chest pain but has been increasing productive white frothy sputum and SOB.    Patient underwent TEE/DCCV Yesterday. During the day he became dyspneic and lightheaded as well as hypotensive and was transferred to ICU and had a  Central Line placed and started on Pressors for Cardiogenic Shock. Started having increased PVC's so Amiodarone gtt was increased. States he feels slightly better today.   Assessment & Plan:   Principal Problem:   Pneumonia Active Problems:   Hx of CABG   CAD (coronary artery disease)   HTN (hypertension)   Stage 4 chronic kidney disease (HCC)   HLD (hyperlipidemia)   Chronic combined systolic and diastolic CHF (congestive heart failure) (HCC)   H/O aortic valve replacement   ICD (implantable cardioverter-defibrillator) in place   Acute on chronic systolic heart failure (HCC)   Hypotension   Research study patient   Atrial fibrillation (HCC)   Elevated brain natriuretic peptide (BNP) level   Aspiration pneumonia (HCC)  Cardiogenic Shock -Transferred to ICU and placed on Pressors with Dobutamine and Norepinephrine; Norepi being weaned  -Co-OX improved to 71% -Cardiology Managing Pressors and appreciate their help  Acute on chronic combined systolic and Diastolic Heart Failure  -Pt notes a weight gain of several pounds in last several days but noted that his weight was 195 when he was seen 11/20 at HF clinic and now it is 197  -Recent ECHO showed EF of 20-25% -Likely worsened by Arrythmia and likely loss of BiV pacing -Admitted for IV diuresis and is being continued on IV 80 mg Lasix Daily with Potassium Supplementation  -Cardiology to did TEE/DCCV yesterday -Hold Entresto given AKI and Hypotension; Cardiology also holding Coreg -Getting Archer  study drug as well -BNP improved slightly   Community Acquired Pneumonia -  -blood culture and sputum cultures have been ordered,  NGTD at 2 days -IV ceftriaxone/azithromycin ordered  -Aspiration precautions ordered, elevate head of bed.   -Continue supportive care with oxygen as needed.   -Incentive spirometry and aspiration precautions ordered.   -Follow clinical course and adjust therapy as needed.    Atrial Flutter with  RVR -This appears to be new Onset -  -He has been on coreg and taking it and was started recently on corlanor. -Cardiology consulted and placed patient on Amiodarone gtt and Heparin gtt -CHADS2VASc of at least 3 -S/p TEE/DCCV today -BiV Pacing; May need Lead Revision in Future  -C/w Amiodarone gtt and Heparin gtt and eventually transition to [place] -EP evaluated and they cannot tell what rhythm is today and plan to re-evaluate in AM   Chronic hypotension -Arterial Line was unsuccessful -He has very soft to low BPs  -Hold Coreg and Entresto -Now on Pressor Support  AKI on Stage 3 CKD  -Cr worsened intially and ? Cardiorenal Syndrome; Cr now slowly improved to 2.75 -Wll need to renally dose medications and follow closely in the setting of ongoing diuresis.   -Consider nephrology consult if any decline in renal function.    CAD s/p CABG  -stable, no chest pain symptoms right now,  -checking troponins for completeness given his SOB symptoms and will have cardiology see the patient in consultation.    -Resume aspirin and atorvastatin.   Hypokalemia -Patient's K+ was 3.4 this AM -Repelte -Continue to Monitor and Replete as Necessary -Repeat CMP in AM   Gout - -Resume home allopurinol daily.  He is high risk for acute exacerbation in the setting of inpatient diuresis.    LLE edema - [place] of left Lower extremity negative for DVT.    DVT prophylaxis: Heparin gtt Code Status: FULL CODE Family Communication: No family present at bedside Disposition Plan: Remain Inpateint in ICU   Consultants:   Cardiology Heart Failure Team  Cardiology EP  Procedures:   TEE/DCCV  Central Line placed yesterday    Antimicrobials:  Anti-infectives (From admission, onward)   Start     Dose/Rate Route Frequency Ordered Stop   05/07/2017 1400  cefTRIAXone (ROCEPHIN) 1 g in dextrose 5 % 50 mL IVPB     1 g 100 mL/hr over 30 Minutes Intravenous Every 24 hours 05/09/17 1742     04/24/2017 1400   azithromycin (ZITHROMAX) 500 mg in dextrose 5 % 250 mL IVPB     500 mg 250 mL/hr over 60 Minutes Intravenous Every 24 hours 05/09/17 1742 05/15/17 1359   05/09/17 1315  cefTRIAXone (ROCEPHIN) 1 g in dextrose 5 % 50 mL IVPB     1 g 100 mL/hr over 30 Minutes Intravenous  Once 05/09/17 1312 05/09/17 1358   05/09/17 1315  azithromycin (ZITHROMAX) 500 mg in dextrose 5 % 250 mL IVPB     500 mg 250 mL/hr over 60 Minutes Intravenous  Once 05/09/17 1312 05/09/17 1505     Subjective: Seen and examined this AM and states he still isn't feeling too great but is feeling a little better than yesterday. States he has a dry cough. No CP. No nausea or vomiting.   Objective: Vitals:   05/11/17 1645 05/11/17 1700 05/11/17 1800 05/11/17 1830  BP: 101/79 109/68 98/74 (!) 88/66  Pulse: (!) 43 99 (!) 106 100  Resp: 19 20 18 18   Temp:  TempSrc:      SpO2: 96% 96% 92% 93%  Weight:      Height:        Intake/Output Summary (Last 24 hours) at 05/11/2017 1905 Last data filed at 05/11/2017 1745 Gross per 24 hour  Intake 2885.18 ml  Output 1425 ml  Net 1460.18 ml   Filed Weights   05/13/2017 0429 04/22/2017 1830 05/11/17 0500  Weight: 89.3 kg (196 lb 12.8 oz) 96.8 kg (213 lb 6.5 oz) 96.9 kg (213 lb 10 oz)   Examination: Physical Exam:  Constitutional: WN/WD Caucasian male in NAD appears calm  Eyes: Sclerae anicteric; Normal lids ENMT: External Ears and nose appear normal Neck: Right IJ Central. Has some JVD Respiratory: Diminished to Auscultation; No appreciable wheezing/rales/rhonchi Cardiovascular: Irregular Rhythm. Has some LE Edema Abdomen: Soft, Mildly tender. ND. Bowel Sounds present  GU: Deferred Musculoskeletal: No contractures. No cyanosis Skin: Warm and dry. No appreciable rashes, lesions on a limited skin eval Neurologic: CN 2-12 grossly intact. No appreciable focal deficist Psychiatric: Normal mood and affect. Intact judgement and insight  Data Reviewed: I have personally reviewed  following labs and imaging studies  CBC: Recent Labs  Lab 05/09/17 1121 04/18/2017 0530 05/11/17 0357  WBC 8.5 7.7 8.1  NEUTROABS 6.8  --   --   HGB 13.4 12.1* 11.7*  HCT 43.9 37.6* 36.1*  MCV 104.8* 100.3* 99.4  PLT 155 132* 401*   Basic Metabolic Panel: Recent Labs  Lab 05/09/17 1121 05/11/2017 0827 05/11/17 0357  NA 138 135 135  K 4.3 4.0 3.4*  CL 101 103 102  CO2 24 20* 23  GLUCOSE 115* 121* 141*  BUN 79* 88* 84*  CREATININE 2.73* 2.89* 2.75*  CALCIUM 9.6 8.8* 8.6*  MG  --  2.6* 2.6*  PHOS  --  5.3* 5.0*   GFR: Estimated Creatinine Clearance: 28.5 mL/min (A) (by C-G formula based on SCr of 2.75 mg/dL (H)). Liver Function Tests: Recent Labs  Lab 05/09/17 1121 04/29/2017 0827 05/11/17 0357  AST 66* 37 28  ALT 65* 52 44  ALKPHOS 84 68 64  BILITOT 1.0 0.7 0.7  PROT 7.1 6.3* 5.8*  ALBUMIN 4.2 3.6 3.3*   No results for input(s): LIPASE, AMYLASE in the last 168 hours. No results for input(s): AMMONIA in the last 168 hours. Coagulation Profile: No results for input(s): INR, PROTIME in the last 168 hours. Cardiac Enzymes: Recent Labs  Lab 05/09/17 1121 05/09/17 1607  TROPONINI 0.07* 0.07*   BNP (last 3 results) No results for input(s): PROBNP in the last 8760 hours. HbA1C: No results for input(s): HGBA1C in the last 72 hours. CBG: No results for input(s): GLUCAP in the last 168 hours. Lipid Profile: No results for input(s): CHOL, HDL, LDLCALC, TRIG, CHOLHDL, LDLDIRECT in the last 72 hours. Thyroid Function Tests: No results for input(s): TSH, T4TOTAL, FREET4, T3FREE, THYROIDAB in the last 72 hours. Anemia Panel: No results for input(s): VITAMINB12, FOLATE, FERRITIN, TIBC, IRON, RETICCTPCT in the last 72 hours. Sepsis Labs: No results for input(s): PROCALCITON, LATICACIDVEN in the last 168 hours.  Recent Results (from the past 240 hour(s))  Respiratory Panel by PCR     Status: None   Collection Time: 05/09/17  3:49 PM  Result Value Ref Range Status    Adenovirus NOT DETECTED NOT DETECTED Final   Coronavirus 229E NOT DETECTED NOT DETECTED Final   Coronavirus HKU1 NOT DETECTED NOT DETECTED Final   Coronavirus NL63 NOT DETECTED NOT DETECTED Final   Coronavirus OC43 NOT DETECTED  NOT DETECTED Final   Metapneumovirus NOT DETECTED NOT DETECTED Final   Rhinovirus / Enterovirus NOT DETECTED NOT DETECTED Final   Influenza A NOT DETECTED NOT DETECTED Final   Influenza B NOT DETECTED NOT DETECTED Final   Parainfluenza Virus 1 NOT DETECTED NOT DETECTED Final   Parainfluenza Virus 2 NOT DETECTED NOT DETECTED Final   Parainfluenza Virus 3 NOT DETECTED NOT DETECTED Final   Parainfluenza Virus 4 NOT DETECTED NOT DETECTED Final   Respiratory Syncytial Virus NOT DETECTED NOT DETECTED Final   Bordetella pertussis NOT DETECTED NOT DETECTED Final   Chlamydophila pneumoniae NOT DETECTED NOT DETECTED Final   Mycoplasma pneumoniae NOT DETECTED NOT DETECTED Final    Comment: Performed at [facility], [place] [address], [place], [place] [address]  Culture, blood (routine x 2) Call MD if unable to obtain prior to antibiotics being given     Status: None (Preliminary result)   Collection Time: 05/09/17  4:14 PM  Result Value Ref Range Status   Specimen Description LEFT ANTECUBITAL  Final   Special Requests   Final    BOTTLES DRAWN AEROBIC AND ANAEROBIC Blood Culture adequate volume   Culture NO GROWTH 2 DAYS  Final   Report Status PENDING  Incomplete  Culture, blood (routine x 2) Call MD if unable to obtain prior to antibiotics being given     Status: None (Preliminary result)   Collection Time: 05/09/17  4:19 PM  Result Value Ref Range Status   Specimen Description BLOOD LEFT ARM  Final   Special Requests   Final    BOTTLES DRAWN AEROBIC AND ANAEROBIC Blood Culture adequate volume   Culture NO GROWTH 2 DAYS  Final   Report Status PENDING  Incomplete  MRSA PCR Screening     Status: None   Collection Time: 04/24/2017  6:50 PM  Result Value Ref Range  Status   MRSA by PCR NEGATIVE NEGATIVE Final    Comment:        The GeneXpert MRSA Assay (FDA approved for NASAL specimens only), is one component of a comprehensive MRSA colonization surveillance program. It is not intended to diagnose MRSA infection nor to guide or monitor treatment for MRSA infections.     Radiology Studies: Dg Chest Port 1 View  Result Date: 04/14/2017 CLINICAL DATA:  Acute on chronic systolic heart failure. EXAM: PORTABLE CHEST 1 VIEW COMPARISON:  04/14/2017 FINDINGS: Again noted is a biventricular ICD. Heart remains enlarged. Persistent bibasilar chest densities. Right jugular central line in the SVC region. Negative for a pneumothorax. Enlargement of the central vascular structures. Right perihilar densities. IMPRESSION: Cardiomegaly with asymmetric lung densities which are suggestive for pulmonary edema. These findings are similar to the recent comparison examination. Basilar chest densities are suggestive for pleural fluid and consolidation. New central line with the tip in the SVC region. Negative for a pneumothorax. Electronically Signed   By: Markus Daft M.D.   On: 05/08/2017 18:22   Dg Chest Port 1 View  Result Date: 04/21/2017 CLINICAL DATA:  Pneumonia, shortness of breath. EXAM: PORTABLE CHEST 1 VIEW COMPARISON:  Radiograph yesterday FINDINGS: Post median sternotomy with prosthetic valve. Multilead left-sided pacemaker in place. Cardiomegaly again seen. Worsening vascular congestion. Increasing pleural effusions and bibasilar airspace disease, right greater than left. No pneumothorax. IMPRESSION: 1. Findings suggest CHF with increasing vascular congestion and bilateral pleural effusions. 2. Worsening bibasilar airspace disease, right greater than left, pneumonia versus vascular overlap. Electronically Signed   By: Fonnie Birkenhead.D.  On: 04/16/2017 04:24   Scheduled Meds: . allopurinol  300 mg Oral Daily  . aspirin EC  81 mg Oral Daily  . atorvastatin   40 mg Oral q1800  . Chlorhexidine Gluconate Cloth  6 each Topical Daily  . furosemide  80 mg Intravenous BID  . GALACTIC Study - Placebo / omecamtiv mecarbil ([facility])  1 tablet Oral BID  . guaiFENesin  600 mg Oral BID  . mouth rinse  15 mL Mouth Rinse BID  . multivitamin with minerals  1 tablet Oral Daily  . potassium chloride  40 mEq Oral BID  . sodium chloride flush  10-40 mL Intracatheter Q12H  . sodium chloride flush  3 mL Intravenous Q12H  . sodium chloride flush  3 mL Intravenous Q12H   Continuous Infusions: . sodium chloride    . sodium chloride    . sodium chloride    . amiodarone 60 mg/hr (05/11/17 1741)  . azithromycin Stopped (05/11/17 1515)  . cefTRIAXone (ROCEPHIN)  IV Stopped (05/11/17 1342)  . DOBUTamine 2.5 mcg/kg/min (05/11/17 0800)  . heparin 1,300 Units/hr (05/11/17 0800)  . norepinephrine (LEVOPHED) Adult infusion 8 mcg/min (05/11/17 1741)    LOS: 2 days   Kerney Elbe, DO [facility] Pager [phone]  If 7PM-7AM, please contact night-coverage [email or web address] Password [email or web address] 05/11/2017, 7:05 PM

## 2017-05-11 NOTE — Plan of Care (Signed)
Patient progressing 

## 2017-05-12 DIAGNOSIS — I483 Typical atrial flutter: Secondary | ICD-10-CM

## 2017-05-12 LAB — BRAIN NATRIURETIC PEPTIDE: B Natriuretic Peptide: 1720.5 pg/mL — ABNORMAL HIGH (ref 0.0–100.0)

## 2017-05-12 LAB — COMPREHENSIVE METABOLIC PANEL
ALK PHOS: 58 U/L (ref 38–126)
ALT: 36 U/L (ref 17–63)
ANION GAP: 7 (ref 5–15)
AST: 22 U/L (ref 15–41)
Albumin: 3.5 g/dL (ref 3.5–5.0)
BILIRUBIN TOTAL: 0.6 mg/dL (ref 0.3–1.2)
BUN: 73 mg/dL — AB (ref 6–20)
CALCIUM: 8.8 mg/dL — AB (ref 8.9–10.3)
CO2: 25 mmol/L (ref 22–32)
Chloride: 101 mmol/L (ref 101–111)
Creatinine, Ser: 2.36 mg/dL — ABNORMAL HIGH (ref 0.61–1.24)
GFR calc Af Amer: 31 mL/min — ABNORMAL LOW (ref >60)
GFR, EST NON AFRICAN AMERICAN: 27 mL/min — AB (ref >60)
GLUCOSE: 146 mg/dL — AB (ref 65–99)
POTASSIUM: 3.8 mmol/L (ref 3.5–5.1)
Sodium: 133 mmol/L — ABNORMAL LOW (ref 135–145)
TOTAL PROTEIN: 6.2 g/dL — AB (ref 6.5–8.1)

## 2017-05-12 LAB — HEPARIN LEVEL (UNFRACTIONATED): Heparin Unfractionated: 0.3 IU/mL (ref 0.30–0.70)

## 2017-05-12 LAB — CBC
HEMATOCRIT: 35.3 % — AB (ref 39.0–52.0)
Hemoglobin: 11.6 g/dL — ABNORMAL LOW (ref 13.0–17.0)
MCH: 32.5 pg (ref 26.0–34.0)
MCHC: 32.9 g/dL (ref 30.0–36.0)
MCV: 98.9 fL (ref 78.0–100.0)
PLATELETS: 114 10*3/uL — AB (ref 150–400)
RBC: 3.57 MIL/uL — ABNORMAL LOW (ref 4.22–5.81)
RDW: 16.3 % — AB (ref 11.5–15.5)
WBC: 10 10*3/uL (ref 4.0–10.5)

## 2017-05-12 LAB — COOXEMETRY PANEL
CARBOXYHEMOGLOBIN: 1.5 % (ref 0.5–1.5)
Methemoglobin: 0.9 % (ref 0.0–1.5)
O2 Saturation: 68.6 %
Total hemoglobin: 11.4 g/dL — ABNORMAL LOW (ref 12.0–16.0)

## 2017-05-12 LAB — PHOSPHORUS: Phosphorus: 3.9 mg/dL (ref 2.5–4.6)

## 2017-05-12 LAB — MAGNESIUM: MAGNESIUM: 2.2 mg/dL (ref 1.7–2.4)

## 2017-05-12 MED ORDER — FUROSEMIDE 10 MG/ML IJ SOLN: 80.0000 mg | Freq: Three times a day (TID) | INTRAMUSCULAR | Status: DC

## 2017-05-12 MED ORDER — FUROSEMIDE 10 MG/ML IJ SOLN
20.0000 mg/h | INTRAVENOUS | Status: DC
  Administered 2017-05-12 – 2017-05-13 (×2): 15 mg/h via INTRAVENOUS
  Administered 2017-05-13 – 2017-05-14 (×2): 20 mg/h via INTRAVENOUS
  Filled 2017-05-12 (×4): qty 25
  Filled 2017-05-12: qty 21
  Filled 2017-05-12 (×2): qty 25

## 2017-05-12 MED ORDER — METOLAZONE 2.5 MG PO TABS
2.5000 mg | ORAL_TABLET | Freq: Once | ORAL | Status: AC
  Administered 2017-05-12: 2.5 mg via ORAL
  Filled 2017-05-12: qty 1

## 2017-05-12 MED ORDER — POTASSIUM CHLORIDE CRYS ER 20 MEQ PO TBCR
40.0000 meq | EXTENDED_RELEASE_TABLET | Freq: Three times a day (TID) | ORAL | Status: DC
  Administered 2017-05-12 – 2017-05-14 (×8): 40 meq via ORAL
  Filled 2017-05-12 (×8): qty 2

## 2017-05-12 MED ORDER — APIXABAN 5 MG PO TABS
5.0000 mg | ORAL_TABLET | Freq: Two times a day (BID) | ORAL | Status: DC
  Administered 2017-05-12 – 2017-05-14 (×5): 5 mg via ORAL
  Filled 2017-05-12 (×5): qty 1

## 2017-05-12 MED ORDER — FUROSEMIDE 10 MG/ML IJ SOLN
80.0000 mg | Freq: Once | INTRAMUSCULAR | Status: AC
  Administered 2017-05-12: 80 mg via INTRAVENOUS
  Filled 2017-05-12: qty 8

## 2017-05-12 NOTE — Progress Notes (Signed)
Electrophysiology Rounding Note  Patient Name: Dillon Cisneros Date of Encounter: 05/12/2017  Primary Cardiologist: Aundra Dubin Electrophysiologist: Lovena Le   Subjective   The patient is feeling more SOB today, no CP.   Inpatient Medications    Scheduled Meds: . allopurinol  300 mg Oral Daily  . apixaban  5 mg Oral BID  . aspirin EC  81 mg Oral Daily  . atorvastatin  40 mg Oral q1800  . Chlorhexidine Gluconate Cloth  6 each Topical Daily  . GALACTIC Study - Placebo / omecamtiv mecarbil ([facility])  1 tablet Oral BID  . guaiFENesin  600 mg Oral BID  . mouth rinse  15 mL Mouth Rinse BID  . multivitamin with minerals  1 tablet Oral Daily  . potassium chloride  40 mEq Oral TID  . sodium chloride flush  10-40 mL Intracatheter Q12H  . sodium chloride flush  3 mL Intravenous Q12H  . sodium chloride flush  3 mL Intravenous Q12H   Continuous Infusions: . sodium chloride    . sodium chloride    . sodium chloride    . amiodarone 60 mg/hr (05/12/17 0432)  . azithromycin Stopped (05/11/17 1515)  . cefTRIAXone (ROCEPHIN)  IV Stopped (05/11/17 1342)  . DOBUTamine 2.5 mcg/kg/min (05/11/17 2000)  . furosemide (LASIX) infusion    . norepinephrine (LEVOPHED) Adult infusion 6 mcg/min (05/12/17 0636)   PRN Meds: sodium chloride, Place/Maintain arterial line **AND** sodium chloride, acetaminophen, nitroGLYCERIN, ondansetron (ZOFRAN) IV, sodium chloride flush, sodium chloride flush, sodium chloride flush   Vital Signs    Vitals:   05/12/17 0630 05/12/17 0700 05/12/17 0730 05/12/17 0813  BP: 97/70 99/71 103/70   Pulse: (!) 102 (!) 134 (!) 104   Resp: 17 (!) 22 (!) 22   Temp:    97.7 F (36.5 C)  TempSrc:    Oral  SpO2: 94% 93% 93%   Weight:      Height:        Intake/Output Summary (Last 24 hours) at 05/12/2017 0909 Last data filed at 05/12/2017 0700 Gross per 24 hour  Intake 2661.96 ml  Output 2275 ml  Net 386.96 ml   Filed Weights   05/09/2017 1830 05/11/17 0500 05/12/17 0433    Weight: 213 lb 6.5 oz (96.8 kg) 213 lb 10 oz (96.9 kg) 210 lb 1.6 oz (95.3 kg)    Physical Exam    GEN- The patient is ill appearing, alert and oriented x 3 today.   Head- normocephalic, atraumatic Eyes-  Sclera clear, conjunctiva pink Ears- hearing intact Oropharynx- clear Neck- supple Lungs- diminished with rales b/l bases, normal work of breathing Heart- IRegular tachy rate and rhythm  GI- soft, NT, ND Extremities- no clubbing, cyanosis, 1+ edema Skin- no rash or lesion Psych- euthymic mood, full affect Neuro- strength and sensation are intact  Labs    CBC Recent Labs    05/09/17 1121  05/11/17 0357 05/12/17 0410  WBC 8.5   < > 8.1 10.0  NEUTROABS 6.8  --   --   --   HGB 13.4   < > 11.7* 11.6*  HCT 43.9   < > 36.1* 35.3*  MCV 104.8*   < > 99.4 98.9  PLT 155   < > 128* 114*   < > = values in this interval not displayed.   Basic Metabolic Panel Recent Labs    05/11/17 0357 05/11/17 1856 05/12/17 0410  NA 135 133* 133*  K 3.4* 3.6 3.8  CL 102 100* 101  CO2  23 23 25   GLUCOSE 141* 140* 146*  BUN 84* 78* 73*  CREATININE 2.75* 2.53* 2.36*  CALCIUM 8.6* 8.8* 8.8*  MG 2.6* 2.5* 2.2  PHOS 5.0*  --  3.9   Liver Function Tests Recent Labs    05/11/17 0357 05/12/17 0410  AST 28 22  ALT 44 36  ALKPHOS 64 58  BILITOT 0.7 0.6  PROT 5.8* 6.2*  ALBUMIN 3.3* 3.5   Cardiac Enzymes Recent Labs    05/09/17 1121 05/09/17 1607  TROPONINI 0.07* 0.07*     Telemetry    Likely is SR/ST w/Vpaced rhythm 100-110bpm  (personally reviewed)  Radiology    Dg Chest Port 1 View Result Date: 05/07/2017 CLINICAL DATA:  Acute on chronic systolic heart failure. EXAM: PORTABLE CHEST 1 VIEW COMPARISON:  04/22/2017 FINDINGS: Again noted is a biventricular ICD. Heart remains enlarged. Persistent bibasilar chest densities. Right jugular central line in the SVC region. Negative for a pneumothorax. Enlargement of the central vascular structures. Right perihilar densities.  IMPRESSION: Cardiomegaly with asymmetric lung densities which are suggestive for pulmonary edema. These findings are similar to the recent comparison examination. Basilar chest densities are suggestive for pleural fluid and consolidation. New central line with the tip in the SVC region. Negative for a pneumothorax. Electronically Signed   By: Markus Daft M.D.   On: 05/03/2017 18:22    Assessment & Plan    1.  Atrial flutter Difficult to tell if typical or not by EKG LA is severely enlarged by last echo post TEE/DCCV 05/05/2017 Would continue IV amiodarone for at least another 24 hours  Continue anticoagulation long term for CHADS2VASC of at least 3, heparin gtt >> Eliquis to start today  Telemetry is likely SR/ST, difficult though and noted variable paced morphology of QRS complexes Brief periods of what looks like fast PAF We have asked device rep to interrogate device to confirm SR and evaluate LV/BiVe pacing   2.  Acute on chronic systolic heart failure Continue management with AHF team Remains on pressor support (levophed, dobutrex) increasing O2 demands  More SOB Remains cumulatively fluid + 3223ml, ordered for lasix gtt  3.  RA lead noise Chronic issue but resulting in decreased CRT pacing Will need to consider RA lead revision at some point electively down the road after he recovers from this hospitalization  4.  CAD s/p CABG No recent ischemic symptoms  5.  CKD, stage III Looks stable  6.  Frequent ventricular ectopy On IV amiodarone, V ectopy/PVCs appear less, not resolved in last 24 hours, keep for AF Keep K >3.9, Mg >1.8   Signed, Baldwin [place], PA-C  05/12/2017, 9:09 AM   EP Attending  Patient seen and examined. Agree with above. The patient does not look as good today although his creatinine is improved. I have had his device interogated under my direction. He is in NSR with sinus tachycardia. I have had his device reprogrammed increasing his AV delay just  a bit. He is still having PVC's. Continue amiodarone.   Mikle Bosworth.D.

## 2017-05-12 NOTE — Progress Notes (Signed)
PROGRESS NOTE    Dillon Cisneros  JJK:[id number] DOB: 03-Oct-1950 DOA: 05/09/2017 PCP: [name], MD  Brief Narrative:  Dillon Cisneros is a 66 y.o. male with ischemic cardiomyopathy with ICD in place and EF noted to be in the 20% range presented to ED at the direction of his heart failure team with shortness of breath for the last 2 days worsening after thanksgiving. He had been seen at primary care doctor office and sent here after discussion with Dr. [name] the heart failure clinic. Has had a shortness of breath with a dry cough for 2 days.  There was concern for pneumonia with his symptoms of persistent white sputum production. He denies fever and chills at this time.   The patient states this feels like this when he had pneumonia in the past. He has chronic biventricular heart failure and chronic pacemaker. He states he gained several pounds over Thanksgiving.  States his legs are more swollen than normal. In particular his left leg is more swollen than baseline.  It tested negative for DVT when checked in ED today.  The patient reports that he feels weak all over. His heart rate has been fast and he does describe them as palpitations. He was seen by heart failure clinic in Nov 2018 and had adjustment of his medications through the heart failure clinic to try and control his heart rate and improve his pacemaker effectiveness but he has been noted in ED to be in afib/flutter which is considered new onset per ED physician.     Cardiology was consulted to see him and they requested that he be admitted to [facility] so that he can be followed by the heart failure team.  The heart failure team was also consulted saw patient this AM.  The patient denies having chest pain but has been increasing productive white frothy sputum and SOB.    Patient underwent TEE/DCCV 05/07/2017. During the day on 11/27 he became dyspneic and lightheaded as well as hypotensive and was transferred to ICU and had  a Central Line placed and started on Pressors for Cardiogenic Shock. Started having increased PVC's so Amiodarone gtt was increased. Felt better yesterday but became more SOB overnight and symptomatic so had O2 increased.  Cardiology attempting to wean pressors and starting patient on Lasix gtt. IV Amiodarone being continued and IV Heparin gtt being transitioned to po Eliquis.   Assessment & Plan:   Principal Problem:   Pneumonia Active Problems:   Hx of CABG   CAD (coronary artery disease)   HTN (hypertension)   Stage 4 chronic kidney disease (HCC)   HLD (hyperlipidemia)   Chronic combined systolic and diastolic CHF (congestive heart failure) (HCC)   H/O aortic valve replacement   ICD (implantable cardioverter-defibrillator) in place   Acute on chronic systolic heart failure (HCC)   Hypotension   Research study patient   Atrial fibrillation (HCC)   Elevated brain natriuretic peptide (BNP) level   Aspiration pneumonia (HCC)  Cardiogenic Shock -Transferred to ICU and placed on Pressors with Dobutamine and Norepinephrine; Norepi attempted to be weaned down but needs to be titrated up given patient's Hypotension -Co-OX went from 71.5% -> 68.6% -Cardiology Now starting IV Lasix gtt -Cardiology Managing Pressors and appreciate their help  Acute on chronic combined systolic and Diastolic Heart Failure  -Pt notes a weight gain of several pounds in last several days but noted that his weight was 195 when he was seen 11/20 at HF clinic and now it  is 197  -Recent ECHO showed EF of 20-25% -Likely worsened by Arrythmia and likely loss of BiV pacing -Admitted for IV diuresis and is being continued on IV 80 mg Lasix Daily with Potassium Supplementation; Now being started on IV Lasix gtt -Cardiology to did TEE/DCCV 11/27 -Hold Entresto given AKI and Hypotension; Cardiology also holding Coreg -Getting Galactic study drug as well -BNP improved slightly yesterday but now worsened and is  1720.5 -Strict I's/O's, Daily Weights -Patient is + 3.176 Liters   Community Acquired Pneumonia  -blood culture and sputum cultures have been ordered,  NGTD at 3 days -IV ceftriaxone/azithromycin ordered  -Aspiration precautions ordered, elevate head of bed.   -Continue supportive care with oxygen as needed.   -Incentive spirometry and aspiration precautions ordered.   -Follow clinical course and adjust therapy as needed.    Atrial Flutter with RVR -This appears to be new Onset  -He has been on coreg and taking it and was started recently on corlanor. -Cardiology consulted and placed patient on Amiodarone gtt and Heparin gtt -CHADS2VASc of at least 3 -S/p TEE/DCCV 11/27 -BiV Pacing; May need Lead Revision in Future  -C/w Amiodarone gtt and Heparin gtt and eventually transition to [place] -EP evaluated and they cannot tell what rhythm is today and plan to re-evaluate in AM; Device Rep to Interrogate Device to confirm Sinus Rhythm and evaluate LV/BiV pacing   Chronic Hypotension -Arterial Line was unsuccessful -He has very soft to extremely low BPs  -Hold Coreg and Entresto -Now on Pressor Support and being titrated up  AKI on Stage 3 CKD  -Cr worsened intially and ? Cardiorenal Syndrome; Cr now slowly improved from 2.75 -> 2.36 -Cardiology starting Lasix gtt -Wll need to renally dose medications and follow closely in the setting of ongoing diuresis.   -Consider nephrology consult if any decline in renal function.    CAD s/p CABG  -stable, no chest pain symptoms right now,  -checking troponins for completeness given his SOB symptoms and will have cardiology see the patient in consultation.    -Resume aspirin and atorvastatin.   Hypokalemia -Patient's K+ was 3.8 this AM -Repelte -Continue to Monitor and Replete as Necessary -Repeat CMP in AM   Gout - -Resume home allopurinol daily.  He is high risk for acute exacerbation in the setting of inpatient diuresis.    LLE edema - [place]  of left Lower extremity negative for DVT.    DVT prophylaxis: Heparin gtt -> Now Apixaban Code Status: FULL CODE Family Communication: No family present at bedside Disposition Plan: Remain Inpateint in ICU   Consultants:   Cardiology Heart Failure Team  Cardiology EP  Procedures:   TEE/DCCV  Central Line placed 11/27   Antimicrobials:  Anti-infectives (From admission, onward)   Start     Dose/Rate Route Frequency Ordered Stop   05/09/2017 1400  cefTRIAXone (ROCEPHIN) 1 g in dextrose 5 % 50 mL IVPB     1 g 100 mL/hr over 30 Minutes Intravenous Every 24 hours 05/09/17 1742     05/06/2017 1400  azithromycin (ZITHROMAX) 500 mg in dextrose 5 % 250 mL IVPB     500 mg 250 mL/hr over 60 Minutes Intravenous Every 24 hours 05/09/17 1742 05/15/17 1359   05/09/17 1315  cefTRIAXone (ROCEPHIN) 1 g in dextrose 5 % 50 mL IVPB     1 g 100 mL/hr over 30 Minutes Intravenous  Once 05/09/17 1312 05/09/17 1358   05/09/17 1315  azithromycin (ZITHROMAX) 500 mg in dextrose 5 %  250 mL IVPB     500 mg 250 mL/hr over 60 Minutes Intravenous  Once 05/09/17 1312 05/09/17 1505     Subjective: Seen and examined this AM and still doesn't feel great but felt better than last night. Still SOB but improved if he does not move. No CP.   Objective: Vitals:   05/12/17 0615 05/12/17 0630 05/12/17 0700 05/12/17 0730  BP: 101/67 97/70 99/71  103/70  Pulse: (!) 102 (!) 102 (!) 134 (!) 104  Resp: (!) 21 17 (!) 22 (!) 22  Temp:      TempSrc:      SpO2: 93% 94% 93% 93%  Weight:      Height:        Intake/Output Summary (Last 24 hours) at 05/12/2017 0745 Last data filed at 05/12/2017 0600 Gross per 24 hour  Intake 2987.08 ml  Output 2125 ml  Net 862.08 ml   Filed Weights   05/01/2017 1830 05/11/17 0500 05/12/17 0433  Weight: 96.8 kg (213 lb 6.5 oz) 96.9 kg (213 lb 10 oz) 95.3 kg (210 lb 1.6 oz)   Examination: Physical Exam:  Constitutional: WN/WD Caucasian male in NAD appears calm Eyes: Sclerae  anicteric; Lids normal ENMT: External Ears and nose appear normal Neck: Supple with JVP Respiratory: Diminished breath sounds but unlabored breathing. No wheezing or rhonchi Cardiovascular: RRR but tachycardic. Has Lower Extremity Edema Abdomen: Soft, Mildly tender. ND. Bowel sounds present GU: Deferred Musculoskeletal: No contractures. No cyanosis Skin: Warm and Dry. No appreciable rashes or lesions on a limited skin eval Neurologic: CN 2-12 grossly intact. No appreciable focal deficits Psychiatric: Normal mood and affect. Intact judgement and insight  Data Reviewed: I have personally reviewed following labs and imaging studies  CBC: Recent Labs  Lab 05/09/17 1121 04/29/2017 0530 05/11/17 0357 05/12/17 0410  WBC 8.5 7.7 8.1 10.0  NEUTROABS 6.8  --   --   --   HGB 13.4 12.1* 11.7* 11.6*  HCT 43.9 37.6* 36.1* 35.3*  MCV 104.8* 100.3* 99.4 98.9  PLT 155 132* 128* 263*   Basic Metabolic Panel: Recent Labs  Lab 05/09/17 1121 05/01/2017 0827 05/11/17 0357 05/11/17 1856 05/12/17 0410  NA 138 135 135 133* 133*  K 4.3 4.0 3.4* 3.6 3.8  CL 101 103 102 100* 101  CO2 24 20* 23 23 25   GLUCOSE 115* 121* 141* 140* 146*  BUN 79* 88* 84* 78* 73*  CREATININE 2.73* 2.89* 2.75* 2.53* 2.36*  CALCIUM 9.6 8.8* 8.6* 8.8* 8.8*  MG  --  2.6* 2.6* 2.5* 2.2  PHOS  --  5.3* 5.0*  --  3.9   GFR: Estimated Creatinine Clearance: 33 mL/min (A) (by C-G formula based on SCr of 2.36 mg/dL (H)). Liver Function Tests: Recent Labs  Lab 05/09/17 1121 04/30/2017 0827 05/11/17 0357 05/12/17 0410  AST 66* 37 28 22  ALT 65* 52 44 36  ALKPHOS 84 68 64 58  BILITOT 1.0 0.7 0.7 0.6  PROT 7.1 6.3* 5.8* 6.2*  ALBUMIN 4.2 3.6 3.3* 3.5   No results for input(s): LIPASE, AMYLASE in the last 168 hours. No results for input(s): AMMONIA in the last 168 hours. Coagulation Profile: No results for input(s): INR, PROTIME in the last 168 hours. Cardiac Enzymes: Recent Labs  Lab 05/09/17 1121 05/09/17 1607   TROPONINI 0.07* 0.07*   BNP (last 3 results) No results for input(s): PROBNP in the last 8760 hours. HbA1C: No results for input(s): HGBA1C in the last 72 hours. CBG: No results for input(s):  GLUCAP in the last 168 hours. Lipid Profile: No results for input(s): CHOL, HDL, LDLCALC, TRIG, CHOLHDL, LDLDIRECT in the last 72 hours. Thyroid Function Tests: No results for input(s): TSH, T4TOTAL, FREET4, T3FREE, THYROIDAB in the last 72 hours. Anemia Panel: No results for input(s): VITAMINB12, FOLATE, FERRITIN, TIBC, IRON, RETICCTPCT in the last 72 hours. Sepsis Labs: No results for input(s): PROCALCITON, LATICACIDVEN in the last 168 hours.  Recent Results (from the past 240 hour(s))  Respiratory Panel by PCR     Status: None   Collection Time: 05/09/17  3:49 PM  Result Value Ref Range Status   Adenovirus NOT DETECTED NOT DETECTED Final   Coronavirus 229E NOT DETECTED NOT DETECTED Final   Coronavirus HKU1 NOT DETECTED NOT DETECTED Final   Coronavirus NL63 NOT DETECTED NOT DETECTED Final   Coronavirus OC43 NOT DETECTED NOT DETECTED Final   Metapneumovirus NOT DETECTED NOT DETECTED Final   Rhinovirus / Enterovirus NOT DETECTED NOT DETECTED Final   Influenza A NOT DETECTED NOT DETECTED Final   Influenza B NOT DETECTED NOT DETECTED Final   Parainfluenza Virus 1 NOT DETECTED NOT DETECTED Final   Parainfluenza Virus 2 NOT DETECTED NOT DETECTED Final   Parainfluenza Virus 3 NOT DETECTED NOT DETECTED Final   Parainfluenza Virus 4 NOT DETECTED NOT DETECTED Final   Respiratory Syncytial Virus NOT DETECTED NOT DETECTED Final   Bordetella pertussis NOT DETECTED NOT DETECTED Final   Chlamydophila pneumoniae NOT DETECTED NOT DETECTED Final   Mycoplasma pneumoniae NOT DETECTED NOT DETECTED Final    Comment: Performed at [facility], [place]. [address] [place], [place] [address]  Culture, blood (routine x 2) Call MD if unable to obtain prior to antibiotics being given     Status: None  (Preliminary result)   Collection Time: 05/09/17  4:14 PM  Result Value Ref Range Status   Specimen Description LEFT ANTECUBITAL  Final   Special Requests   Final    BOTTLES DRAWN AEROBIC AND ANAEROBIC Blood Culture adequate volume   Culture NO GROWTH 2 DAYS  Final   Report Status PENDING  Incomplete  Culture, blood (routine x 2) Call MD if unable to obtain prior to antibiotics being given     Status: None (Preliminary result)   Collection Time: 05/09/17  4:19 PM  Result Value Ref Range Status   Specimen Description BLOOD LEFT ARM  Final   Special Requests   Final    BOTTLES DRAWN AEROBIC AND ANAEROBIC Blood Culture adequate volume   Culture NO GROWTH 2 DAYS  Final   Report Status PENDING  Incomplete  MRSA PCR Screening     Status: None   Collection Time: 05/05/2017  6:50 PM  Result Value Ref Range Status   MRSA by PCR NEGATIVE NEGATIVE Final    Comment:        The GeneXpert MRSA Assay (FDA approved for NASAL specimens only), is one component of a comprehensive MRSA colonization surveillance program. It is not intended to diagnose MRSA infection nor to guide or monitor treatment for MRSA infections.     Radiology Studies: Dg Chest Port 1 View  Result Date: 05/12/2017 CLINICAL DATA:  Acute on chronic systolic heart failure. EXAM: PORTABLE CHEST 1 VIEW COMPARISON:  04/15/2017 FINDINGS: Again noted is a biventricular ICD. Heart remains enlarged. Persistent bibasilar chest densities. Right jugular central line in the SVC region. Negative for a pneumothorax. Enlargement of the central vascular structures. Right perihilar densities. IMPRESSION: Cardiomegaly with asymmetric lung densities which are suggestive for  pulmonary edema. These findings are similar to the recent comparison examination. Basilar chest densities are suggestive for pleural fluid and consolidation. New central line with the tip in the SVC region. Negative for a pneumothorax. Electronically Signed   By: Markus Daft M.D.    On: 05/12/2017 18:22   Scheduled Meds: . allopurinol  300 mg Oral Daily  . apixaban  5 mg Oral BID  . aspirin EC  81 mg Oral Daily  . atorvastatin  40 mg Oral q1800  . Chlorhexidine Gluconate Cloth  6 each Topical Daily  . furosemide  80 mg Intravenous TID  . GALACTIC Study - Placebo / omecamtiv mecarbil ([facility]-[place])  1 tablet Oral BID  . guaiFENesin  600 mg Oral BID  . mouth rinse  15 mL Mouth Rinse BID  . metolazone  2.5 mg Oral Once  . multivitamin with minerals  1 tablet Oral Daily  . potassium chloride  40 mEq Oral TID  . sodium chloride flush  10-40 mL Intracatheter Q12H  . sodium chloride flush  3 mL Intravenous Q12H  . sodium chloride flush  3 mL Intravenous Q12H   Continuous Infusions: . sodium chloride    . sodium chloride    . sodium chloride    . amiodarone 60 mg/hr (05/12/17 0432)  . azithromycin Stopped (05/11/17 1515)  . cefTRIAXone (ROCEPHIN)  IV Stopped (05/11/17 1342)  . DOBUTamine 2.5 mcg/kg/min (05/11/17 2000)  . norepinephrine (LEVOPHED) Adult infusion 6 mcg/min (05/12/17 0636)    LOS: 3 days   Kerney Elbe, DO [facility] Pager [phone]  If 7PM-7AM, please contact night-coverage [email or web address] Password [email or web address] 05/12/2017, 7:45 AM

## 2017-05-12 NOTE — Discharge Instructions (Signed)

## 2017-05-12 NOTE — Progress Notes (Signed)
Patient ID: Dillon Cisneros, male   DOB: July 17, 1950, 66 y.o.   MRN: [id number]     Advanced Heart Failure Rounding Note  PCP: Meda Coffee Cardiologist: Aundra Dubin  Subjective:   11/27 S/P TEE-guided DCCV from flutter to NSR with improved BiV pacing.   11/27developed acute dyspnea and dizziness. Frequent PVCs. He was hypotensive. Amio increased to 60 mg per hour, norepi added and increased to 8 mcg, and dobutamine 2.5 mcg started with low co-ox.    Overnight had increased dyspnea. Oxygen increased to 6 liters per hour. Mild dyspnea at rest. CVP remains 18, co-ox 69% this morning.   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   Objective:   Weight Range: 210 lb 1.6 oz (95.3 kg) Body mass index is 32.91 kg/m.   Vital Signs:   Temp:  [97.6 F (36.4 C)-98 F (36.7 C)] 98 F (36.7 C) (11/29 0400) Pulse Rate:  [43-134] 134 (11/29 0700) Resp:  [15-33] 22 (11/29 0700) BP: (71-119)/(53-85) 99/71 (11/29 0700) SpO2:  [88 %-97 %] 93 % (11/29 0700) Weight:  [210 lb 1.6 oz (95.3 kg)] 210 lb 1.6 oz (95.3 kg) (11/29 0433) Last BM Date: (pta)  Weight change: Filed Weights   04/15/2017 1830 05/11/17 0500 05/12/17 0433  Weight: 213 lb 6.5 oz (96.8 kg) 213 lb 10 oz (96.9 kg) 210 lb 1.6 oz (95.3 kg)    Intake/Output:   Intake/Output Summary (Last 24 hours) at 05/12/2017 0730 Last data filed at 05/12/2017 0600 Gross per 24 hour  Intake 2987.08 ml  Output 2125 ml  Net 862.08 ml      Physical Exam   CVP 18 General:  No resp difficulty HEENT: normal RIJ Neck: supple. JVP to jaw.  Carotids 2+ bilat; no bruits. No lymphadenopathy or thryomegaly appreciated. Cor: PMI nondisplaced. Regular rate & rhythm. No rubs, gallops or murmurs. Lungs: Decreased on 6 liters oxygen.  Abdomen: soft, nontender, nondistended. No hepatosplenomegaly. No bruits or masses.  Good bowel sounds. Extremities: no cyanosis, clubbing, rash, R and LLE 1+ edema Neuro: alert & orientedx3, cranial nerves grossly intact. moves all 4 extremities w/o difficulty. Affect pleasant   Telemetry   BiV pacing PVCs   Labs    CBC Recent Labs    05/09/17 1121  05/11/17 0357 05/12/17 0410  WBC 8.5   < > 8.1 10.0  NEUTROABS 6.8  --   --   --   HGB 13.4   < > 11.7* 11.6*  HCT 43.9   < > 36.1* 35.3*  MCV 104.8*   < > 99.4 98.9  PLT 155   < > 128* 114*   < > = values in this interval not displayed.   Basic Metabolic Panel Recent Labs    05/11/17 0357 05/11/17 1856 05/12/17 0410  NA 135 133* 133*  K 3.4* 3.6 3.8  CL 102 100* 101  CO2 23 23 25   GLUCOSE 141* 140* 146*  BUN 84* 78* 73*  CREATININE 2.75* 2.53* 2.36*  CALCIUM 8.6* 8.8* 8.8*  MG 2.6* 2.5* 2.2  PHOS 5.0*  --  3.9   Liver Function Tests Recent Labs    05/11/17 0357 05/12/17 0410  AST 28 22  ALT 44 36  ALKPHOS 64 58  BILITOT 0.7 0.6  PROT 5.8* 6.2*  ALBUMIN 3.3* 3.5   No results for input(s): LIPASE, AMYLASE in the last 72 hours. Cardiac Enzymes  Recent Labs    05/09/17 1121 05/09/17 1607  TROPONINI 0.07* 0.07*    BNP: BNP (last 3 results) Recent Labs    05/09/17 1215 05/11/17 0357 05/12/17 0410  BNP 1,626.0* 1,412.5* 1,720.5*    ProBNP (last 3 results) No results for input(s): PROBNP in the last 8760 hours.   D-Dimer No results for input(s): DDIMER in the last 72 hours. Hemoglobin A1C No results for input(s): HGBA1C in the last 72 hours. Fasting Lipid Panel No results for input(s): CHOL, HDL, LDLCALC, TRIG, CHOLHDL, LDLDIRECT in the last 72 hours. Thyroid Function Tests No results for input(s): TSH, T4TOTAL, T3FREE, THYROIDAB in the last 72 hours.  Invalid input(s): FREET3  Other results:   Imaging    No results found.   Medications:     Scheduled Medications: . allopurinol  300 mg Oral Daily  . aspirin EC  81 mg Oral Daily  . atorvastatin  40 mg Oral  q1800  . Chlorhexidine Gluconate Cloth  6 each Topical Daily  . furosemide  80 mg Intravenous TID  . GALACTIC Study - Placebo / omecamtiv mecarbil (PI-[facility] [place])  1 tablet Oral BID  . guaiFENesin  600 mg Oral BID  . mouth rinse  15 mL Mouth Rinse BID  . metolazone  2.5 mg Oral Once  . multivitamin with minerals  1 tablet Oral Daily  . potassium chloride  40 mEq Oral BID  . sodium chloride flush  10-40 mL Intracatheter Q12H  . sodium chloride flush  3 mL Intravenous Q12H  . sodium chloride flush  3 mL Intravenous Q12H    Infusions: . sodium chloride    . sodium chloride    . sodium chloride    . amiodarone 60 mg/hr (05/12/17 0432)  . azithromycin Stopped (05/11/17 1515)  . cefTRIAXone (ROCEPHIN)  IV Stopped (05/11/17 1342)  . DOBUTamine 2.5 mcg/kg/min (05/11/17 2000)  . heparin 1,300 Units/hr (05/11/17 2000)  . norepinephrine (LEVOPHED) Adult infusion 6 mcg/min (05/12/17 0636)    PRN Medications: sodium chloride, Place/Maintain arterial line **AND** sodium chloride, acetaminophen, nitroGLYCERIN, ondansetron (ZOFRAN) IV, sodium chloride flush, sodium chloride flush, sodium chloride flush    Patient Profile   66 yo with history of CAD, aortic and mitral valve disease, ischemic cardiomyopathy, and CKD stage 3 was admitted with acute on chronic systolic CHF, ?PNA, AKI on CKD, and atrial flutter.   Assessment/Plan  1. Cardiogenic Shock: Started on norepi and dobutamine. SBP/MAP improving. CO-OX stable. Continue to wean norepi. Continue dobutamine 2.5 mcg.    2. Acute on chronic systolic CHF: Ischemic cardiomyopathy.  EF 20-25% on 6/18 echo.  St Jude CRT-D device.  - CVP remains 18 and he is short of breath. Give Lasix 80 mg IV x 1 then start infusion 15 mg/hr.  Will give 2.5 mg metolazone x1.  - Increase K 40 meq three times a day.   - No Entresto with AKI and low BP.  Will hold Coreg with acute exacerbation.   - Did not tolerate spironolactone in the past with hyperkalemia.   -  Continue Galactic study drug.  - At baseline, percentage of BiV pacing is below goal.  Likely due to mode switching with baseline sinus tachy and faulty atrial lead.  Will need atrial lead re-worked after he recovers from current episode. EP following.  3. CAD: S/p CABG.  No chest pain.  TnI mildly elevated with no trend, likely due to demand ischemia/volume overload. No chest pain.  - Continue ASA 81.  - Continue atorvastatin  40 daily.  4. Bioprosthetic aortic valve: Stable on last echo.  5. S/p mitral valve repair: Stable on last echo.  6. AKI on CKD stage 3.  Baseline creatinine 1.9. Creatinine trending down to 2.36.   7. Atrial flutter: New for him, with RVR.  Has triggered CHF and likely AKI.  - S/P TEE-guided DC-CV - BiV pacing with frequent PVCs NSVT. Continue amio drip.  - Stop heparin. Start eliquis 5 mg twice a day.  8. ?PNA: Bibasilar airspace disease.  No fever and normal WBCs.  He is on azithromycin/ceftriaxone empirically.  Add IS 9. Frequent PVCs/NSVT: Continue amio drip. Supp K.  EP appreciated. Device will be interrogated.  Length of Stay: 3  Amy Clegg, NP  05/12/2017, 7:30 AM  Advanced Heart Failure Team Pager [phone] (M-F; 7a - 4p)  Please contact [facility] for night-coverage after hours (4p -7a ) and weekends on [email or web address]  Patient seen with NP, agree with the above note.  I made adjustments to the above note to reflect my thoughts.  This morning, patient is more short of breath and CVP remains high at 18.  Co-ox still good at 69%.    On exam, he is volume overloaded with JVD.  1+ ankle edema.  Regular rhythm with systolic ejection-type murmur.   Needs better diuresis.  Creatinine is improving, down to 2.36.  Will transition to Lasix gtt at 15 mg/hr after Lasix 80 mg IV bolus and will give a dose of metolazone.  Replace K.   Will continue dobutamine and titrate down on norepinephrine, down to 6 now.   Continue amiodarone gtt, transition from heparin gtt to  Eliquis.  Device interrogated.  He remains in NSR underlying with 91% BiV pacing (likely limited by PVCs).    Long-term, I am very concerned about Mr Hickam.  Renal function will limit his options for advanced therapies.  Would need considerable improvement in creatinine to consider LVAD.   CRITICAL CARE Performed by: Loralie Champagne  Total critical care time: 35 minutes  Critical care time was exclusive of separately billable procedures and treating other patients.  Critical care was necessary to treat or prevent imminent or life-threatening deterioration.  Critical care was time spent personally by me on the following activities: development of treatment plan with patient and/or surrogate as well as nursing, discussions with consultants, evaluation of patient's response to treatment, examination of patient, obtaining history from patient or surrogate, ordering and performing treatments and interventions, ordering and review of laboratory studies, ordering and review of radiographic studies, pulse oximetry and re-evaluation of patient's condition.  Loralie Champagne 05/12/2017 8:14 AM

## 2017-05-13 ENCOUNTER — Inpatient Hospital Stay (HOSPITAL_COMMUNITY): Payer: Medicare Other

## 2017-05-13 DIAGNOSIS — R0602 Shortness of breath: Secondary | ICD-10-CM

## 2017-05-13 DIAGNOSIS — R570 Cardiogenic shock: Secondary | ICD-10-CM

## 2017-05-13 LAB — COMPREHENSIVE METABOLIC PANEL
ALBUMIN: 3.3 g/dL — AB (ref 3.5–5.0)
ALK PHOS: 62 U/L (ref 38–126)
ALT: 28 U/L (ref 17–63)
AST: 19 U/L (ref 15–41)
Anion gap: 9 (ref 5–15)
BILIRUBIN TOTAL: 0.8 mg/dL (ref 0.3–1.2)
BUN: 69 mg/dL — AB (ref 6–20)
CALCIUM: 8.6 mg/dL — AB (ref 8.9–10.3)
CO2: 24 mmol/L (ref 22–32)
Chloride: 98 mmol/L — ABNORMAL LOW (ref 101–111)
Creatinine, Ser: 2.43 mg/dL — ABNORMAL HIGH (ref 0.61–1.24)
GFR calc Af Amer: 30 mL/min — ABNORMAL LOW (ref >60)
GFR calc non Af Amer: 26 mL/min — ABNORMAL LOW (ref >60)
GLUCOSE: 132 mg/dL — AB (ref 65–99)
POTASSIUM: 4.3 mmol/L (ref 3.5–5.1)
SODIUM: 131 mmol/L — AB (ref 135–145)
TOTAL PROTEIN: 5.9 g/dL — AB (ref 6.5–8.1)

## 2017-05-13 LAB — CBC
HCT: 25.1 % — ABNORMAL LOW (ref 39.0–52.0)
HEMATOCRIT: 35.7 % — AB (ref 39.0–52.0)
HEMOGLOBIN: 12.2 g/dL — AB (ref 13.0–17.0)
Hemoglobin: 8.3 g/dL — ABNORMAL LOW (ref 13.0–17.0)
MCH: 33.1 pg (ref 26.0–34.0)
MCH: 33.7 pg (ref 26.0–34.0)
MCHC: 33.1 g/dL (ref 30.0–36.0)
MCHC: 34.2 g/dL (ref 30.0–36.0)
MCV: 100 fL (ref 78.0–100.0)
MCV: 98.6 fL (ref 78.0–100.0)
PLATELETS: 137 10*3/uL — AB (ref 150–400)
Platelets: 104 10*3/uL — ABNORMAL LOW (ref 150–400)
RBC: 2.51 MIL/uL — AB (ref 4.22–5.81)
RBC: 3.62 MIL/uL — ABNORMAL LOW (ref 4.22–5.81)
RDW: 16.7 % — AB (ref 11.5–15.5)
RDW: 17.4 % — AB (ref 11.5–15.5)
WBC: 12.3 10*3/uL — AB (ref 4.0–10.5)
WBC: 10.3 10*3/uL (ref 4.0–10.5)

## 2017-05-13 LAB — URINALYSIS, ROUTINE W REFLEX MICROSCOPIC
Bilirubin Urine: NEGATIVE
Glucose, UA: NEGATIVE mg/dL
Hgb urine dipstick: NEGATIVE
Ketones, ur: NEGATIVE mg/dL
LEUKOCYTES UA: NEGATIVE
NITRITE: NEGATIVE
PH: 5 (ref 5.0–8.0)
Protein, ur: NEGATIVE mg/dL
SPECIFIC GRAVITY, URINE: 1.01 (ref 1.005–1.030)

## 2017-05-13 LAB — OCCULT BLOOD X 1 CARD TO LAB, STOOL: FECAL OCCULT BLD: NEGATIVE

## 2017-05-13 LAB — COOXEMETRY PANEL
CARBOXYHEMOGLOBIN: 1.2 % (ref 0.5–1.5)
Methemoglobin: 1.1 % (ref 0.0–1.5)
O2 SAT: 72.5 %
Total hemoglobin: 11.5 g/dL — ABNORMAL LOW (ref 12.0–16.0)

## 2017-05-13 LAB — PREPARE RBC (CROSSMATCH)

## 2017-05-13 LAB — BRAIN NATRIURETIC PEPTIDE: B Natriuretic Peptide: 2177.6 pg/mL — ABNORMAL HIGH (ref 0.0–100.0)

## 2017-05-13 LAB — ABO/RH: ABO/RH(D): O POS

## 2017-05-13 LAB — MAGNESIUM: MAGNESIUM: 2.2 mg/dL (ref 1.7–2.4)

## 2017-05-13 LAB — PHOSPHORUS: Phosphorus: 3.6 mg/dL (ref 2.5–4.6)

## 2017-05-13 MED ORDER — ACETAZOLAMIDE 250 MG PO TABS: 250.0000 mg | ORAL_TABLET | Freq: Two times a day (BID) | ORAL | Status: DC

## 2017-05-13 MED ORDER — SODIUM CHLORIDE 0.9 % IV SOLN
Freq: Once | INTRAVENOUS | Status: AC
  Administered 2017-05-13: 11:00:00 via INTRAVENOUS

## 2017-05-13 MED ORDER — ACETAZOLAMIDE 250 MG PO TABS
250.0000 mg | ORAL_TABLET | Freq: Two times a day (BID) | ORAL | Status: AC
  Administered 2017-05-13 – 2017-05-14 (×2): 250 mg via ORAL
  Filled 2017-05-13 (×2): qty 1

## 2017-05-13 MED ORDER — METOLAZONE 5 MG PO TABS
5.0000 mg | ORAL_TABLET | Freq: Two times a day (BID) | ORAL | Status: DC
  Administered 2017-05-13 – 2017-05-14 (×4): 5 mg via ORAL
  Filled 2017-05-13 (×4): qty 1

## 2017-05-13 NOTE — Progress Notes (Signed)
  Continue amio and dobutamine .2.5 , norepi 9 mcg, and lasix 20 mg per hour  CVP 19. Sluggish urine output  Discussed with Dr Aundra Dubin. Add 250 mg diamox twice a day.   May need CRRT. Dr Aundra Dubin discussed with him.  Franz Svec NP-C  4:42 PM

## 2017-05-13 NOTE — Progress Notes (Signed)
PROGRESS NOTE    Kahner Yanik  IWP:[id number] DOB: Oct 25, 1950 DOA: 05/09/2017 PCP: [name], MD  Brief Narrative:  Roston Grunewald is a 66 y.o. male with ischemic cardiomyopathy with ICD in place and EF noted to be in the 20% range presented to ED at the direction of his heart failure team with shortness of breath for the last 2 days worsening after thanksgiving. He had been seen at primary care doctor office and sent here after discussion with Dr. [name] the heart failure clinic. Has had a shortness of breath with a dry cough for 2 days.  There was concern for pneumonia with his symptoms of persistent white sputum production. He denies fever and chills at this time.   The patient states this feels like this when he had pneumonia in the past. He has chronic biventricular heart failure and chronic pacemaker. He states he gained several pounds over Thanksgiving.  States his legs are more swollen than normal. In particular his left leg is more swollen than baseline.  It tested negative for DVT when checked in ED today.  The patient reports that he feels weak all over. His heart rate has been fast and he does describe them as palpitations. He was seen by heart failure clinic in Nov 2018 and had adjustment of his medications through the heart failure clinic to try and control his heart rate and improve his pacemaker effectiveness but he has been noted in ED to be in afib/flutter which is considered new onset per ED physician.     Cardiology was consulted to see him and they requested that he be admitted to [facility] so that he can be followed by the heart failure team.  The heart failure team was also consulted saw patient this AM.  The patient denies having chest pain but has been increasing productive white frothy sputum and SOB.    Patient underwent TEE/DCCV 05/04/2017. During the day on 11/27 he became dyspneic and lightheaded as well as hypotensive and was transferred to ICU and had  a Central Line placed and started on Pressors for Cardiogenic Shock. Started having increased PVC's so Amiodarone gtt was increased. Felt better yesterday but became more SOB overnight and symptomatic so had O2 increased.  Cardiology attempting to wean pressors and starting patient on Lasix gtt. IV Amiodarone being continued and IV Heparin gtt being transitioned to po Eliquis. Patient's Hb/Hct Dropped significantly so will check FOBT and transfuse 1 unit of pRBC's Cardiology increasing Lasix gtt and added Metolazone. Because Urine output has been sluggish so Diamox was added and patient may need CRRT per Cardiology.   Assessment & Plan:   Principal Problem:   Pneumonia Active Problems:   Hx of CABG   CAD (coronary artery disease)   HTN (hypertension)   Stage 4 chronic kidney disease (HCC)   HLD (hyperlipidemia)   Chronic combined systolic and diastolic CHF (congestive heart failure) (HCC)   H/O aortic valve replacement   ICD (implantable cardioverter-defibrillator) in place   Acute on chronic systolic heart failure (HCC)   Hypotension   Research study patient   Atrial fibrillation (HCC)   Elevated brain natriuretic peptide (BNP) level   Aspiration pneumonia ([place])  Cardiogenic Shock -Transferred to ICU and placed on Pressors with Dobutamine and Norepinephrine; Norepi attempted to be weaned down but needs to be titrated up given patient's Hypotension -Co-OX went from 71.5% -> 68.6 -> 72.5% -Cardiology Now starting IV Lasix gtt; Lasix gtt increased and Cardiology increased Metolazone to  5 mg po BID -Cardiology Managing Pressors and appreciate their help -Patient may need CRRT  Acute on Chronic combined systolic and Diastolic Heart Failure  -Pt notes a weight gain of several pounds in last several days but noted that his weight was 195 when he was seen 11/20 at HF clinic and now it is 197  -Recent ECHO showed EF of 20-25% -Likely worsened by Arrythmia and likely loss of BiV  pacing -Admitted for IV diuresis and is now on Lasix gtt which has been been increased. Metolazone also has been increased to 5 mg po BID and Cardiology also added Diamox -Cardiology to did TEE/DCCV 11/27 -Hold Entresto given AKI and Hypotension; Cardiology also holding Coreg -Getting [employer] study drug as well -BNP improved slightly but now worsened and is 1720.5 -> 2,177.6 -Strict I's/O's, Daily Weights -Patient is + 5.198 Liters  -Patient may need CRRT  Community Acquired Pneumonia  -blood culture and sputum cultures have been ordered,  NGTD at 3 days -IV ceftriaxone/azithromycin ordered  -Aspiration precautions ordered, elevate head of bed.   -Continue supportive care with oxygen as needed.   -Incentive spirometry and aspiration precautions ordered.   -Follow clinical course and adjust therapy as needed.    Atrial Flutter with RVR -This appears to be new Onset  -He has been on coreg and taking it and was started recently on corlanor. -Cardiology consulted and placed patient on Amiodarone gtt and Heparin gtt -CHADS2VASc of at least 3 -S/p TEE/DCCV 11/27 -BiV Pacing; May need Lead Revision in Future  -C/w Amiodarone gtt and Heparin gtt and eventually transition to [place] -EP evaluated and they cannot tell what rhythm is today and plan to re-evaluate in AM; Device Rep to Interrogate Device to confirm Sinus Rhythm and evaluate LV/BiV pacing   Chronic Hypotension -Arterial Line was unsuccessful -He has very soft to extremely low BPs  -Hold Coreg and Entresto -Now on Pressor Support and being titrated up  AKI on Stage 3 CKD  -Cr worsened intially and ? Cardiorenal Syndrome; Cr now slowly improved from 2.75 -> 2.36 -> 2.43 -Cardiology starting Lasix gtt -Wll need to renally dose medications and follow closely in the setting of ongoing diuresis.   -Consider nephrology consult if any decline in renal function.    CAD s/p CABG  -stable, no chest pain symptoms right now,  -checking  troponins for completeness given his SOB symptoms and will have cardiology see the patient in consultation.    -Resume aspirin and atorvastatin.   Hypokalemia -Patient's K+ was 4.3 this AM -Continue to Monitor and Replete as Necessary -Repeat CMP in AM   Acute Normocytic Anemia -Patient's Hb/Hct dropped from 11.6/35.3 -> 8.3/25.1 -Check FOBT and Transfuse 1 unit -s/p 1 unit transfused and Repeat Hb/Hct 12.2/35.7 -Continue to Monitor for S/Sx of Bleeding as patient is on Anticoagulation -Repeat CBC in AM   Gout - -Resume home allopurinol daily.  He is high risk for acute exacerbation in the setting of inpatient diuresis.    LLE edema - [place] of left Lower extremity negative for DVT.    DVT prophylaxis: Heparin gtt -> Now Apixaban Code Status: FULL CODE Family Communication: No family present at bedside Disposition Plan: Remain Inpateint in ICU   Consultants:   Cardiology Heart Failure Team  Cardiology EP  Procedures:   TEE/DCCV  Central Line placed 11/27   Antimicrobials:  Anti-infectives (From admission, onward)   Start     Dose/Rate Route Frequency Ordered Stop   04/27/2017 1400  cefTRIAXone (  ROCEPHIN) 1 g in dextrose 5 % 50 mL IVPB     1 g 100 mL/hr over 30 Minutes Intravenous Every 24 hours 05/09/17 1742     04/17/2017 1400  azithromycin (ZITHROMAX) 500 mg in dextrose 5 % 250 mL IVPB     500 mg 250 mL/hr over 60 Minutes Intravenous Every 24 hours 05/09/17 1742 05/15/17 1359   05/09/17 1315  cefTRIAXone (ROCEPHIN) 1 g in dextrose 5 % 50 mL IVPB     1 g 100 mL/hr over 30 Minutes Intravenous  Once 05/09/17 1312 05/09/17 1358   05/09/17 1315  azithromycin (ZITHROMAX) 500 mg in dextrose 5 % 250 mL IVPB     500 mg 250 mL/hr over 60 Minutes Intravenous  Once 05/09/17 1312 05/09/17 1505     Subjective: Seen and examined this AM and states he is still SOB but better when he remains still. States he was unable to lay flat and sleep last night. No CP.   Objective: Vitals:     05/13/17 1605 05/13/17 1700 05/13/17 1800 05/13/17 1900  BP: (!) 89/68   91/64  Pulse: (!) 106 (!) 109 (!) 110 (!) 106  Resp: (!) 23 (!) 22 19 (!) 22  Temp: 98.2 F (36.8 C)     TempSrc: Oral     SpO2: 97% 94% 95% 96%  Weight:      Height:        Intake/Output Summary (Last 24 hours) at 05/13/2017 1911 Last data filed at 05/13/2017 1900 Gross per 24 hour  Intake 3036.95 ml  Output 1015 ml  Net 2021.95 ml   Filed Weights   05/11/17 0500 05/12/17 0433 05/13/17 0700  Weight: 96.9 kg (213 lb 10 oz) 95.3 kg (210 lb 1.6 oz) 91.4 kg (201 lb 8 oz)   Examination: Physical Exam:  Constitutional: WN/WD Caucasian male in NAD. Sitting in chair bedside  Eyes: Sclerae anicteric. Lids Normal ENMT: External Ears and nose appear normal Neck: Supple with JVP Respiratory: Diminished with some diffuse crackles. No appreciable wheezing or rhonchi Cardiovascular: RRR; S1, S2, and +S3. Has 1+ LE edema Abdomen: Soft, NT, ND. Bowel sounds present GU: Deferred Musculoskeletal: No contractures. No cyanosis Skin: Warm and Dry. No appreciable rashes or lesions on a limited skin eval Neurologic: CN 2-12 grossly intact. No appreciable focal deficits Psychiatric: Normal mood and affect. Intact judgement and insight  Data Reviewed: I have personally reviewed following labs and imaging studies  CBC: Recent Labs  Lab 05/09/17 1121 04/14/2017 0530 05/11/17 0357 05/12/17 0410 05/13/17 0444 05/13/17 1536  WBC 8.5 7.7 8.1 10.0 12.3* 10.3  NEUTROABS 6.8  --   --   --   --   --   HGB 13.4 12.1* 11.7* 11.6* 8.3* 12.2*  HCT 43.9 37.6* 36.1* 35.3* 25.1* 35.7*  MCV 104.8* 100.3* 99.4 98.9 100.0 98.6  PLT 155 132* 128* 114* 137* 778*   Basic Metabolic Panel: Recent Labs  Lab 05/04/2017 0827 05/11/17 0357 05/11/17 1856 05/12/17 0410 05/13/17 0444  NA 135 135 133* 133* 131*  K 4.0 3.4* 3.6 3.8 4.3  CL 103 102 100* 101 98*  CO2 20* 23 23 25 24   GLUCOSE 121* 141* 140* 146* 132*  BUN 88* 84* 78* 73*  69*  CREATININE 2.89* 2.75* 2.53* 2.36* 2.43*  CALCIUM 8.8* 8.6* 8.8* 8.8* 8.6*  MG 2.6* 2.6* 2.5* 2.2 2.2  PHOS 5.3* 5.0*  --  3.9 3.6   GFR: Estimated Creatinine Clearance: 31.4 mL/min (A) (by C-G formula based  on SCr of 2.43 mg/dL (H)). Liver Function Tests: Recent Labs  Lab 05/09/17 1121 04/28/2017 0827 05/11/17 0357 05/12/17 0410 05/13/17 0444  AST 66* 37 28 22 19   ALT 65* 52 44 36 28  ALKPHOS 84 68 64 58 62  BILITOT 1.0 0.7 0.7 0.6 0.8  PROT 7.1 6.3* 5.8* 6.2* 5.9*  ALBUMIN 4.2 3.6 3.3* 3.5 3.3*   No results for input(s): LIPASE, AMYLASE in the last 168 hours. No results for input(s): AMMONIA in the last 168 hours. Coagulation Profile: No results for input(s): INR, PROTIME in the last 168 hours. Cardiac Enzymes: Recent Labs  Lab 05/09/17 1121 05/09/17 1607  TROPONINI 0.07* 0.07*   BNP (last 3 results) No results for input(s): PROBNP in the last 8760 hours. HbA1C: No results for input(s): HGBA1C in the last 72 hours. CBG: No results for input(s): GLUCAP in the last 168 hours. Lipid Profile: No results for input(s): CHOL, HDL, LDLCALC, TRIG, CHOLHDL, LDLDIRECT in the last 72 hours. Thyroid Function Tests: No results for input(s): TSH, T4TOTAL, FREET4, T3FREE, THYROIDAB in the last 72 hours. Anemia Panel: No results for input(s): VITAMINB12, FOLATE, FERRITIN, TIBC, IRON, RETICCTPCT in the last 72 hours. Sepsis Labs: No results for input(s): PROCALCITON, LATICACIDVEN in the last 168 hours.  Recent Results (from the past 240 hour(s))  Respiratory Panel by PCR     Status: None   Collection Time: 05/09/17  3:49 PM  Result Value Ref Range Status   Adenovirus NOT DETECTED NOT DETECTED Final   Coronavirus 229E NOT DETECTED NOT DETECTED Final   Coronavirus HKU1 NOT DETECTED NOT DETECTED Final   Coronavirus NL63 NOT DETECTED NOT DETECTED Final   Coronavirus OC43 NOT DETECTED NOT DETECTED Final   Metapneumovirus NOT DETECTED NOT DETECTED Final   Rhinovirus /  Enterovirus NOT DETECTED NOT DETECTED Final   Influenza A NOT DETECTED NOT DETECTED Final   Influenza B NOT DETECTED NOT DETECTED Final   Parainfluenza Virus 1 NOT DETECTED NOT DETECTED Final   Parainfluenza Virus 2 NOT DETECTED NOT DETECTED Final   Parainfluenza Virus 3 NOT DETECTED NOT DETECTED Final   Parainfluenza Virus 4 NOT DETECTED NOT DETECTED Final   Respiratory Syncytial Virus NOT DETECTED NOT DETECTED Final   Bordetella pertussis NOT DETECTED NOT DETECTED Final   Chlamydophila pneumoniae NOT DETECTED NOT DETECTED Final   Mycoplasma pneumoniae NOT DETECTED NOT DETECTED Final    Comment: Performed at [facility], [place]. [address], [place], [place] [address]  Culture, blood (routine x 2) Call MD if unable to obtain prior to antibiotics being given     Status: None (Preliminary result)   Collection Time: 05/09/17  4:14 PM  Result Value Ref Range Status   Specimen Description LEFT ANTECUBITAL  Final   Special Requests   Final    BOTTLES DRAWN AEROBIC AND ANAEROBIC Blood Culture adequate volume   Culture NO GROWTH 4 DAYS  Final   Report Status PENDING  Incomplete  Culture, blood (routine x 2) Call MD if unable to obtain prior to antibiotics being given     Status: None (Preliminary result)   Collection Time: 05/09/17  4:19 PM  Result Value Ref Range Status   Specimen Description BLOOD LEFT ARM  Final   Special Requests   Final    BOTTLES DRAWN AEROBIC AND ANAEROBIC Blood Culture adequate volume   Culture NO GROWTH 4 DAYS  Final   Report Status PENDING  Incomplete  MRSA PCR Screening     Status: None  Collection Time: 04/26/2017  6:50 PM  Result Value Ref Range Status   MRSA by PCR NEGATIVE NEGATIVE Final    Comment:        The GeneXpert MRSA Assay (FDA approved for NASAL specimens only), is one component of a comprehensive MRSA colonization surveillance program. It is not intended to diagnose MRSA infection nor to guide or monitor treatment for MRSA infections.       Radiology Studies: Dg Chest Port 1 View  Result Date: 05/13/2017 CLINICAL DATA:  Shortness of Breath EXAM: PORTABLE CHEST 1 VIEW COMPARISON:  04/21/2017 FINDINGS: Left AICD remains in place, unchanged. Right central line is unchanged. Cardiomegaly with bilateral airspace opacities and layering effusions compatible with CHF. No change. IMPRESSION: Moderate CHF and layering effusions.  No change since prior study. Electronically Signed   By: Rolm Baptise M.D.   On: 05/13/2017 09:50   Scheduled Meds: . acetaZOLAMIDE  250 mg Oral BID  . allopurinol  300 mg Oral Daily  . apixaban  5 mg Oral BID  . aspirin EC  81 mg Oral Daily  . atorvastatin  40 mg Oral q1800  . Chlorhexidine Gluconate Cloth  6 each Topical Daily  . GALACTIC Study - Placebo / omecamtiv mecarbil ([facility]-[place])  1 tablet Oral BID  . guaiFENesin  600 mg Oral BID  . mouth rinse  15 mL Mouth Rinse BID  . metolazone  5 mg Oral BID  . multivitamin with minerals  1 tablet Oral Daily  . potassium chloride  40 mEq Oral TID  . sodium chloride flush  10-40 mL Intracatheter Q12H  . sodium chloride flush  3 mL Intravenous Q12H  . sodium chloride flush  3 mL Intravenous Q12H   Continuous Infusions: . sodium chloride    . sodium chloride    . sodium chloride    . amiodarone 60 mg/hr (05/13/17 1724)  . azithromycin Stopped (05/13/17 1724)  . cefTRIAXone (ROCEPHIN)  IV Stopped (05/13/17 1724)  . DOBUTamine 2.5 mcg/kg/min (05/13/17 1724)  . furosemide (LASIX) infusion 20 mg/hr (05/13/17 1724)  . norepinephrine (LEVOPHED) Adult infusion 9 mcg/min (05/13/17 1724)    LOS: 4 days   Kerney Elbe, DO [facility] Pager [phone]  If 7PM-7AM, please contact night-coverage [email or web address] Password [email or web address] 05/13/2017, 7:11 PM

## 2017-05-13 NOTE — Progress Notes (Signed)
#   7.  S/W JESSICA @ [employer] # [phone]   1. ELIQUIS 5 MG BID   COVER- YES  CO-PAY- $ 30.00  PRIOR APPROVAL- NO   2. ELIQUIS 2.5 MG BID   COVER- YES  CO-PAY- $ 30.00  PRIOR APPROVAL- NO   PHARMACY : ANY RETAIL

## 2017-05-13 NOTE — Care Management Note (Addendum)
Case Management Note  Patient Details  Name: Dillon Cisneros MRN: [id number] Date of Birth: 06-22-1950  Subjective/Objective:  From home with wife, who will be with patient 24/7 to assist him, pta indep.  He is   Has EF of 20-25% , Heart Failure, S/p TEE guided DCCV, developed sob and dizziness, freq pvc's, hypotensive.  Started on lasix drip , conts on dobutamine and norepi.  He will be on eliquis , which will be new for him, NCM gave him the 30 day savings coupon and co pay amt of 30.00.   12/6 1548 Tomi Bamberger RN, BSN-Conts on CVVHD, dobutamine, amio, hep drip, 6 liters and iv abx, levophed, Plan home with [facility].  12/7 [place] RN BSN- patient is actively dying per RN.                    Action/Plan: NCM will follow for dc needs.   Expected Discharge Date:                  Expected Discharge Plan:     In-House Referral:     Discharge planning Services  CM Consult  Post Acute Care Choice:    Choice offered to:     DME Arranged:    DME Agency:     HH Arranged:    HH Agency:     Status of Service:  In process, will continue to follow  If discussed at Long Length of Stay Meetings, dates discussed:    Additional Comments:  Zenon Mayo, RN 05/13/2017, 11:33 AM

## 2017-05-13 NOTE — Progress Notes (Signed)
Pharmacist Heart Failure Core Measure Documentation  Assessment: Dillon Cisneros has an EF documented as 20-25% on 11/20/16 by Echo.  Rationale: Heart failure patients with left ventricular systolic dysfunction (LVSD) and an EF < 40% should be prescribed an angiotensin converting enzyme inhibitor (ACEI) or angiotensin receptor blocker (ARB) at discharge unless a contraindication is documented in the medical record.  This patient is not currently on an ACEI or ARB for HF.  This note is being placed in the record in order to provide documentation that a contraindication to the use of these agents is present for this encounter.  ACE Inhibitor or Angiotensin Receptor Blocker is contraindicated (specify all that apply)  []   ACEI allergy AND ARB allergy []   Angioedema []   Moderate or severe aortic stenosis []   Hyperkalemia [x]   Hypotension []   Renal artery stenosis [x]   Worsening renal function, preexisting renal disease or dysfunction  Hildred Laser, Pharm D 05/13/2017 9:24 AM

## 2017-05-13 NOTE — Progress Notes (Signed)
Patient ID: Dillon Cisneros, male   DOB: [birth year], 66 y.o.   MRN: [id number]     Advanced Heart Failure Rounding Note  PCP: Meda Coffee Cardiologist: Aundra Dubin  Subjective:   11/27 S/P TEE-guided DCCV from flutter to NSR with improved BiV pacing.   11/27developed acute dyspnea and dizziness. Frequent PVCs. He was hypotensive. Amio increased to 60 mg per hour, norepi added and increased to 8 mcg, and dobutamine 2.5 mcg started with low co-ox.   Yesterday started on lasix drip @15  mg per hour. Continued on dobutamine 2.5 mcg and norepi was increased to 11 mcg. Weight down a few pounds.   Feeling better.   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   Objective:   Weight Range: 210 lb 1.6 oz (95.3 kg) Body mass index is 32.91 kg/m.   Vital Signs:   Temp:  [97.7 F (36.5 C)-99.1 F (37.3 C)] 98.4 F (36.9 C) (11/30 0315) Pulse Rate:  [40-108] 99 (11/30 0500) Resp:  [13-25] 17 (11/30 0500) BP: (83-119)/(58-82) 91/64 (11/30 0500) SpO2:  [85 %-98 %] 94 % (11/30 0500) Last BM Date: 05/12/17  Weight change: Filed Weights   04/26/2017 1830 05/11/17 0500 05/12/17 0433  Weight: 213 lb 6.5 oz (96.8 kg) 213 lb 10 oz (96.9 kg) 210 lb 1.6 oz (95.3 kg)    Intake/Output:   Intake/Output Summary (Last 24 hours) at 05/13/2017 0709 Last data filed at 05/13/2017 0500 Gross per 24 hour  Intake 2070.01 ml  Output 1285 ml  Net 785.01 ml      Physical Exam  CVP 20 General:  Well appearing. No resp difficulty HEENT: normal. RIJ  Neck: supple. JVP to jaw. Carotids 2+ bilat; no bruits. No lymphadenopathy or thryomegaly appreciated. Cor: PMI nondisplaced. Regular rate & rhythm. No rubs, or murmurs. +S3  Lungs: Bronchial breath sounds right base on 2 liters oxygen Abdomen: soft, nontender, nondistended. No hepatosplenomegaly. No bruits or masses.  Good bowel sounds. Extremities: no cyanosis, clubbing, rash, R and LLE 2+ edema Neuro: alert & orientedx3, cranial nerves grossly intact. moves all 4 extremities w/o difficulty. Affect pleasant   Telemetry   BiV PVCs   Labs    CBC Recent Labs    05/12/17 0410 05/13/17 0444  WBC 10.0 12.3*  HGB 11.6* 8.3*  HCT 35.3* 25.1*  MCV 98.9 100.0  PLT 114* 756*   Basic Metabolic Panel Recent Labs    05/12/17 0410 05/13/17 0444  NA 133* 131*  K 3.8 4.3  CL 101 98*  CO2 25 24  GLUCOSE 146* 132*  BUN 73* 69*  CREATININE 2.36* 2.43*  CALCIUM 8.8* 8.6*  MG 2.2 2.2  PHOS 3.9 3.6   Liver Function Tests Recent Labs    05/12/17 0410 05/13/17 0444  AST 22 19  ALT 36 28  ALKPHOS 58 62  BILITOT 0.6 0.8  PROT 6.2* 5.9*  ALBUMIN 3.5 3.3*   No results for input(s): LIPASE, AMYLASE in the last 72 hours. Cardiac Enzymes No results for input(s): CKTOTAL, CKMB, CKMBINDEX, TROPONINI in the last 72 hours.  BNP: BNP (last 3 results) Recent Labs    05/09/17 1215 05/11/17 0357 05/12/17 0410  BNP 1,626.0* 1,412.5* 1,720.5*    ProBNP (last 3 results) No results for input(s): PROBNP in the last 8760 hours.   D-Dimer No results for input(s): DDIMER in the last 72 hours. Hemoglobin A1C No results for  input(s): HGBA1C in the last 72 hours. Fasting Lipid Panel No results for input(s): CHOL, HDL, LDLCALC, TRIG, CHOLHDL, LDLDIRECT in the last 72 hours. Thyroid Function Tests No results for input(s): TSH, T4TOTAL, T3FREE, THYROIDAB in the last 72 hours.  Invalid input(s): FREET3  Other results:   Imaging    No results found.   Medications:     Scheduled Medications: . allopurinol  300 mg Oral Daily  . apixaban  5 mg Oral BID  . aspirin EC  81 mg Oral Daily  . atorvastatin  40 mg Oral q1800  . Chlorhexidine Gluconate Cloth  6 each Topical Daily  . GALACTIC Study - Placebo / omecamtiv mecarbil (PI-[facility])  1 tablet Oral BID  . guaiFENesin  600 mg Oral BID  .  mouth rinse  15 mL Mouth Rinse BID  . multivitamin with minerals  1 tablet Oral Daily  . potassium chloride  40 mEq Oral TID  . sodium chloride flush  10-40 mL Intracatheter Q12H  . sodium chloride flush  3 mL Intravenous Q12H  . sodium chloride flush  3 mL Intravenous Q12H    Infusions: . sodium chloride    . sodium chloride    . sodium chloride    . amiodarone 60 mg/hr (05/13/17 0500)  . azithromycin Stopped (05/12/17 1505)  . cefTRIAXone (ROCEPHIN)  IV Stopped (05/12/17 1333)  . DOBUTamine 2.5 mcg/kg/min (05/13/17 0500)  . furosemide (LASIX) infusion 15 mg/hr (05/13/17 0500)  . norepinephrine (LEVOPHED) Adult infusion 11.013 mcg/min (05/13/17 0500)    PRN Medications: sodium chloride, Place/Maintain arterial line **AND** sodium chloride, acetaminophen, nitroGLYCERIN, ondansetron (ZOFRAN) IV, sodium chloride flush, sodium chloride flush, sodium chloride flush    Patient Profile   66 yo with history of CAD, aortic and mitral valve disease, ischemic cardiomyopathy, and CKD stage 3 was admitted with acute on chronic systolic CHF, ?PNA, AKI on CKD, and atrial flutter.   Assessment/Plan  1. Cardiogenic Shock: Started on norepi and dobutamine. SBP/MAP improving.  CO-OX 73%. Continue dobutamine 2.5 mcg + norepi.     2. Acute on chronic systolic CHF: Ischemic cardiomyopathy.  EF 20-25% on 6/18 echo.  St Jude CRT-D device.  CVP 20, weight down some but still volume overloaded and UOP was not marked yesterday - Increase Lasix gtt to 20 mg per hour and increase metolazone 5 mg twice a day.  - Continue K 40 meq three times a day.   - No Entresto with AKI and low BP.  Will hold Coreg with acute exacerbation.   - Did not tolerate spironolactone in the past with hyperkalemia.   - Continue Galactic study drug.  - At baseline, percentage of BiV pacing is below goal.  Likely due to mode switching with baseline sinus tachy and faulty atrial lead.  Will need atrial lead re-worked after he  recovers from current episode. EP following.  3. CAD: S/p CABG.  No chest pain.  TnI mildly elevated with no trend, likely due to demand ischemia/volume overload. No chest pain.  - Continue ASA 81.  - Continue atorvastatin 40 daily.  4. Bioprosthetic aortic valve: Stable on last echo.  5. S/p mitral valve repair: Stable on last echo.  6. AKI on CKD stage 3.  Baseline creatinine 1.9. Creatinine today 2.43.    7. Atrial flutter: New for him, with RVR.  Has triggered CHF and likely AKI.  S/P TEE-guided DC-CV.  Today in NSR with BiV pacing with frequent PVCs NSVT.  - Continue amio drip.  -  Continue eliquis 5 mg twice a day.  8. ?PNA: Bibasilar airspace disease.  WBC trending up. He is on azithromycin/ceftriaxone empirically.  Add IS 9. Frequent PVCs/NSVT: Continue amio drip. Supp K.  EP appreciated. Device will be interrogated.  Length of Stay: 4  Amy Clegg, NP  05/13/2017, 7:09 AM  Advanced Heart Failure Team Pager [phone] (M-F; 7a - 4p)  Please contact [facility] for night-coverage after hours (4p -7a ) and weekends on [email or web address]  Patient seen with NP, agree with the above note.  I made adjustments to the note to reflect my thoughts.    Co-ox stable today, CVP still very high and volume overloaded on exam.  MAP stable on norepinephrine 11 and dobutamine 2.5.  BUN/creatinine fairly stable.  - Needs further diuresis => increase Lasix to 20 mg/hr and increase metolazone to 5 mg bid.   He remains in NSR with BiV pacing.  Continue amiodarone.    Long-term prognosis concerning.  With renal dysfunction, he is not a good LVAD candidate.   Bronchial breath sounds right base, possible PNA.  Afebrile.  Continue course of ceftriaxone/azithromycin.   CRITICAL CARE Performed by: Loralie Champagne  Total critical care time: 35 minutes  Critical care time was exclusive of separately billable procedures and treating other patients.  Critical care was necessary to treat or prevent imminent or  life-threatening deterioration.  Critical care was time spent personally by me on the following activities: development of treatment plan with patient and/or surrogate as well as nursing, discussions with consultants, evaluation of patient's response to treatment, examination of patient, obtaining history from patient or surrogate, ordering and performing treatments and interventions, ordering and review of laboratory studies, ordering and review of radiographic studies, pulse oximetry and re-evaluation of patient's condition.  Loralie Champagne 05/13/2017 7:48 AM

## 2017-05-13 NOTE — Progress Notes (Signed)
Progress Note  Patient Name: Dillon Cisneros Date of Encounter: 05/13/2017  Primary Cardiologist: Dr. Harl Bowie  Subjective   "I feel a little better today". No chest pain.   Inpatient Medications    Scheduled Meds: . allopurinol  300 mg Oral Daily  . apixaban  5 mg Oral BID  . aspirin EC  81 mg Oral Daily  . atorvastatin  40 mg Oral q1800  . Chlorhexidine Gluconate Cloth  6 each Topical Daily  . GALACTIC Study - Placebo / omecamtiv mecarbil (PI-[facility])  1 tablet Oral BID  . guaiFENesin  600 mg Oral BID  . mouth rinse  15 mL Mouth Rinse BID  . metolazone  5 mg Oral BID  . multivitamin with minerals  1 tablet Oral Daily  . potassium chloride  40 mEq Oral TID  . sodium chloride flush  10-40 mL Intracatheter Q12H  . sodium chloride flush  3 mL Intravenous Q12H  . sodium chloride flush  3 mL Intravenous Q12H   Continuous Infusions: . sodium chloride    . sodium chloride    . sodium chloride    . sodium chloride    . amiodarone 60 mg/hr (05/13/17 0700)  . azithromycin Stopped (05/12/17 1505)  . cefTRIAXone (ROCEPHIN)  IV Stopped (05/12/17 1333)  . DOBUTamine 2.5 mcg/kg/min (05/13/17 0700)  . furosemide (LASIX) infusion 20 mg/hr (05/13/17 0748)  . norepinephrine (LEVOPHED) Adult infusion 11.013 mcg/min (05/13/17 0700)   PRN Meds: sodium chloride, Place/Maintain arterial line **AND** sodium chloride, acetaminophen, nitroGLYCERIN, ondansetron (ZOFRAN) IV, sodium chloride flush, sodium chloride flush, sodium chloride flush   Vital Signs    Vitals:   05/13/17 0600 05/13/17 0700 05/13/17 0739 05/13/17 0800  BP: 109/75 98/74  108/72  Pulse: (!) 102 (!) 102  (!) 107  Resp: 20 (!) 21  (!) 25  Temp:   (!) 97.5 F (36.4 C) (!) 97.5 F (36.4 C)  TempSrc:   Oral Oral  SpO2: 92% 92%  91%  Weight:  201 lb 8 oz (91.4 kg)    Height:        Intake/Output Summary (Last 24 hours) at 05/13/2017 0843 Last data filed at 05/13/2017 0800 Gross per 24 hour  Intake 2156.41 ml  Output  1485 ml  Net 671.41 ml   Filed Weights   05/11/17 0500 05/12/17 0433 05/13/17 0700  Weight: 213 lb 10 oz (96.9 kg) 210 lb 1.6 oz (95.3 kg) 201 lb 8 oz (91.4 kg)    Telemetry    Sinus tachycardia with biv pacing - Personally Reviewed  ECG    none - Personally Reviewed  Physical Exam   GEN: No acute distress.   Neck: No JVD Cardiac: RRR, no murmurs, rubs, or gallops.  Respiratory: Clear to auscultation bilaterally. GI: Soft, nontender, non-distended  MS: No edema; No deformity. Neuro:  Nonfocal  Psych: Normal affect   Labs    Chemistry Recent Labs  Lab 05/11/17 0357 05/11/17 1856 05/12/17 0410 05/13/17 0444  NA 135 133* 133* 131*  K 3.4* 3.6 3.8 4.3  CL 102 100* 101 98*  CO2 23 23 25 24   GLUCOSE 141* 140* 146* 132*  BUN 84* 78* 73* 69*  CREATININE 2.75* 2.53* 2.36* 2.43*  CALCIUM 8.6* 8.8* 8.8* 8.6*  PROT 5.8*  --  6.2* 5.9*  ALBUMIN 3.3*  --  3.5 3.3*  AST 28  --  22 19  ALT 44  --  36 28  ALKPHOS 64  --  58 62  BILITOT 0.7  --  0.6 0.8  GFRNONAA 22* 25* 27* 26*  GFRAA 26* 28* 31* 30*  ANIONGAP 10 10 7 9      Hematology Recent Labs  Lab 05/11/17 0357 05/12/17 0410 05/13/17 0444  WBC 8.1 10.0 12.3*  RBC 3.63* 3.57* 2.51*  HGB 11.7* 11.6* 8.3*  HCT 36.1* 35.3* 25.1*  MCV 99.4 98.9 100.0  MCH 32.2 32.5 33.1  MCHC 32.4 32.9 33.1  RDW 16.2* 16.3* 16.7*  PLT 128* 114* 137*    Cardiac Enzymes Recent Labs  Lab 05/09/17 1121 05/09/17 1607  TROPONINI 0.07* 0.07*   No results for input(s): TROPIPOC in the last 168 hours.   BNP Recent Labs  Lab 05/09/17 1215 05/11/17 0357 05/12/17 0410  BNP 1,626.0* 1,412.5* 1,720.5*     DDimer No results for input(s): DDIMER in the last 168 hours.   Radiology    No results found.  Cardiac Studies   none  Patient Profile     66 y.o. male admitted with volume overload and atrial fib/flutter/developed worsening CHF with a low output state  Assessment & Plan    1. Atrial fib/flutter - he has been  cardioverted back to NSR. He maintains nsr 2. Acute on chronic systolic heart failure - he still has evidence of volume overload with elevated CVP. His diuretic dose has been increased as per Dr. Aundra Dubin 3. ICD - he still has some noise on his atrial lead. I have reviewed the lead position and his LV lead is very high. However, he is convinced that he had a clinical improvement with BiV upgrade 3 years ago.  For questions or updates, please contact [name] Please consult [email or web address] for contact info under Cardiology/STEMI.      Signed, Cristopher Peru, MD  05/13/2017, 8:43 AM  Patient ID: Dillon Cisneros, male   DOB: [birth year], 66 y.o.   MRN: [id number]

## 2017-05-14 ENCOUNTER — Inpatient Hospital Stay (HOSPITAL_COMMUNITY): Payer: Medicare Other

## 2017-05-14 DIAGNOSIS — N179 Acute kidney failure, unspecified: Secondary | ICD-10-CM

## 2017-05-14 DIAGNOSIS — N183 Chronic kidney disease, stage 3 (moderate): Secondary | ICD-10-CM

## 2017-05-14 LAB — COOXEMETRY PANEL
CARBOXYHEMOGLOBIN: 1.1 % (ref 0.5–1.5)
Carboxyhemoglobin: 1.6 % — ABNORMAL HIGH (ref 0.5–1.5)
Methemoglobin: 0.9 % (ref 0.0–1.5)
Methemoglobin: 1.1 % (ref 0.0–1.5)
O2 SAT: 70.6 %
O2 SAT: 63.3 %
TOTAL HEMOGLOBIN: 12.2 g/dL (ref 12.0–16.0)
Total hemoglobin: 12.3 g/dL (ref 12.0–16.0)

## 2017-05-14 LAB — CULTURE, BLOOD (ROUTINE X 2)
CULTURE: NO GROWTH
Culture: NO GROWTH
Special Requests: ADEQUATE
Special Requests: ADEQUATE

## 2017-05-14 LAB — RENAL FUNCTION PANEL
ALBUMIN: 3.2 g/dL — AB (ref 3.5–5.0)
ANION GAP: 11 (ref 5–15)
BUN: 76 mg/dL — AB (ref 6–20)
CO2: 22 mmol/L (ref 22–32)
Calcium: 8.6 mg/dL — ABNORMAL LOW (ref 8.9–10.3)
Chloride: 91 mmol/L — ABNORMAL LOW (ref 101–111)
Creatinine, Ser: 3.27 mg/dL — ABNORMAL HIGH (ref 0.61–1.24)
GFR calc Af Amer: 21 mL/min — ABNORMAL LOW (ref >60)
GFR calc non Af Amer: 18 mL/min — ABNORMAL LOW (ref >60)
GLUCOSE: 137 mg/dL — AB (ref 65–99)
POTASSIUM: 4.2 mmol/L (ref 3.5–5.1)
Phosphorus: 4.5 mg/dL (ref 2.5–4.6)
SODIUM: 124 mmol/L — AB (ref 135–145)

## 2017-05-14 LAB — CBC WITH DIFFERENTIAL/PLATELET
Basophils Absolute: 0 10*3/uL (ref 0.0–0.1)
Basophils Relative: 0 %
EOS ABS: 0 10*3/uL (ref 0.0–0.7)
EOS PCT: 0 %
HCT: 35.6 % — ABNORMAL LOW (ref 39.0–52.0)
Hemoglobin: 11.5 g/dL — ABNORMAL LOW (ref 13.0–17.0)
LYMPHS ABS: 0.4 10*3/uL — AB (ref 0.7–4.0)
LYMPHS PCT: 4 %
MCH: 31.3 pg (ref 26.0–34.0)
MCHC: 32.3 g/dL (ref 30.0–36.0)
MCV: 97 fL (ref 78.0–100.0)
MONOS PCT: 12 %
Monocytes Absolute: 1.2 10*3/uL — ABNORMAL HIGH (ref 0.1–1.0)
Neutro Abs: 8.2 10*3/uL — ABNORMAL HIGH (ref 1.7–7.7)
Neutrophils Relative %: 83 %
PLATELETS: 110 10*3/uL — AB (ref 150–400)
RBC: 3.67 MIL/uL — AB (ref 4.22–5.81)
RDW: 17.1 % — ABNORMAL HIGH (ref 11.5–15.5)
WBC: 9.9 10*3/uL (ref 4.0–10.5)

## 2017-05-14 LAB — COMPREHENSIVE METABOLIC PANEL
ALK PHOS: 167 U/L — AB (ref 38–126)
ALT: 37 U/L (ref 17–63)
AST: 33 U/L (ref 15–41)
Albumin: 3 g/dL — ABNORMAL LOW (ref 3.5–5.0)
Anion gap: 10 (ref 5–15)
BILIRUBIN TOTAL: 0.6 mg/dL (ref 0.3–1.2)
BUN: 71 mg/dL — AB (ref 6–20)
CALCIUM: 8.8 mg/dL — AB (ref 8.9–10.3)
CO2: 23 mmol/L (ref 22–32)
CREATININE: 2.89 mg/dL — AB (ref 0.61–1.24)
Chloride: 95 mmol/L — ABNORMAL LOW (ref 101–111)
GFR, EST AFRICAN AMERICAN: 24 mL/min — AB (ref >60)
GFR, EST NON AFRICAN AMERICAN: 21 mL/min — AB (ref >60)
Glucose, Bld: 134 mg/dL — ABNORMAL HIGH (ref 65–99)
Potassium: 4.7 mmol/L (ref 3.5–5.1)
Sodium: 128 mmol/L — ABNORMAL LOW (ref 135–145)
TOTAL PROTEIN: 6.1 g/dL — AB (ref 6.5–8.1)

## 2017-05-14 LAB — TYPE AND SCREEN
ABO/RH(D): O POS
ANTIBODY SCREEN: NEGATIVE
Unit division: 0

## 2017-05-14 LAB — URINE CULTURE: CULTURE: NO GROWTH

## 2017-05-14 LAB — BPAM RBC
Blood Product Expiration Date: 201812182359
ISSUE DATE / TIME: 201811300943
Unit Type and Rh: 5100

## 2017-05-14 LAB — MAGNESIUM: MAGNESIUM: 2.3 mg/dL (ref 1.7–2.4)

## 2017-05-14 LAB — PHOSPHORUS: Phosphorus: 4.1 mg/dL (ref 2.5–4.6)

## 2017-05-14 LAB — PROCALCITONIN: Procalcitonin: 1.68 ng/mL

## 2017-05-14 MED ORDER — PRISMASOL BGK 4/2.5 32-4-2.5 MEQ/L IV SOLN
INTRAVENOUS | Status: DC
  Administered 2017-05-14 – 2017-05-20 (×36): via INTRAVENOUS_CENTRAL
  Filled 2017-05-14 (×57): qty 5000

## 2017-05-14 MED ORDER — TOLVAPTAN 15 MG PO TABS
15.0000 mg | ORAL_TABLET | Freq: Once | ORAL | Status: AC
  Administered 2017-05-14: 15 mg via ORAL
  Filled 2017-05-14: qty 1

## 2017-05-14 MED ORDER — SODIUM CHLORIDE 0.9 % FOR CRRT
INTRAVENOUS_CENTRAL | Status: DC | PRN
  Filled 2017-05-14: qty 1000

## 2017-05-14 MED ORDER — FUROSEMIDE 10 MG/ML IJ SOLN
160.0000 mg | Freq: Two times a day (BID) | INTRAVENOUS | Status: DC
  Administered 2017-05-14 (×2): 160 mg via INTRAVENOUS
  Filled 2017-05-14 (×2): qty 16
  Filled 2017-05-14: qty 2

## 2017-05-14 MED ORDER — HEPARIN (PORCINE) IN NACL 100-0.45 UNIT/ML-% IJ SOLN
1550.0000 [IU]/h | INTRAMUSCULAR | Status: DC
  Administered 2017-05-14: 1300 [IU]/h via INTRAVENOUS
  Administered 2017-05-16 – 2017-05-17 (×2): 1450 [IU]/h via INTRAVENOUS
  Administered 2017-05-17: 1550 [IU]/h via INTRAVENOUS
  Filled 2017-05-14 (×5): qty 250

## 2017-05-14 MED ORDER — MILRINONE LACTATE IN DEXTROSE 20-5 MG/100ML-% IV SOLN
0.2500 ug/kg/min | INTRAVENOUS | Status: DC
  Administered 2017-05-14 – 2017-05-15 (×4): 0.25 ug/kg/min via INTRAVENOUS
  Filled 2017-05-14 (×4): qty 100

## 2017-05-14 MED ORDER — MILRINONE LACTATE IN DEXTROSE 20-5 MG/100ML-% IV SOLN: 0.2500 ug/kg/min | INTRAVENOUS | Status: DC

## 2017-05-14 MED ORDER — HEPARIN SODIUM (PORCINE) 1000 UNIT/ML DIALYSIS
1000.0000 [IU] | INTRAMUSCULAR | Status: DC | PRN
  Administered 2017-05-16: 2400 [IU] via INTRAVENOUS_CENTRAL
  Filled 2017-05-14: qty 6
  Filled 2017-05-14: qty 4
  Filled 2017-05-14: qty 6

## 2017-05-14 MED ORDER — ALLOPURINOL 100 MG PO TABS
100.0000 mg | ORAL_TABLET | Freq: Every day | ORAL | Status: DC
  Administered 2017-05-15 – 2017-05-20 (×6): 100 mg via ORAL
  Filled 2017-05-14 (×6): qty 1

## 2017-05-14 MED ORDER — PRISMASOL BGK 4/2.5 32-4-2.5 MEQ/L IV SOLN
INTRAVENOUS | Status: DC
  Administered 2017-05-15 – 2017-05-19 (×4): via INTRAVENOUS_CENTRAL
  Filled 2017-05-14 (×6): qty 5000

## 2017-05-14 MED ORDER — LIDOCAINE HCL (PF) 1 % IJ SOLN
INTRAMUSCULAR | Status: AC
  Administered 2017-05-14: 18:00:00
  Filled 2017-05-14: qty 5

## 2017-05-14 MED ORDER — ALTEPLASE 2 MG IJ SOLR: 2.0000 mg | Freq: Once | INTRAMUSCULAR | Status: DC | PRN

## 2017-05-14 MED ORDER — PRISMASOL BGK 4/2.5 32-4-2.5 MEQ/L IV SOLN
INTRAVENOUS | Status: DC
  Administered 2017-05-15 – 2017-05-19 (×10): via INTRAVENOUS_CENTRAL
  Filled 2017-05-14 (×12): qty 5000

## 2017-05-14 NOTE — Consult Note (Signed)
Renal Service Consult Note [facility]  Dillon Cisneros 05/14/2017 [place] Requesting Physician:  Dr Aundra Dubin  Reason for Consult:  Acute / chronic renal , decomp CHF HPI: The patient is a 66 y.o. year-old with hx of CAD/ CABG, bio AoVR and MV repair, afib/ aflutter, ICM EF 25% w/ CRT-D device. Admitted 5-6 days ago with decomp CHF and now w/ cardiogenic shock, dyspnea/ persistent CHF/ SOB not resolving with diuresis and inotrope support.  Creat up to 2.9 today, baseline (1.7- 2.1). Asked to see for CRRT.    Pt having sig probs with breathing, has been up in the halls walking. "I just need some relief". No CP, no abd pain, no n/v/d.  No urinary c/o.     ROS  denies CP  no joint pain   no HA  no blurry vision  no rash  no diarrhea  no nausea/ vomiting   Past Medical History  Past Medical History:  Diagnosis Date  . Anemia   . Arthritis    arthritis  . Atrial fibrillation ([place]) 12/28/2015  . Basal cell carcinoma 2011   scalp/neck/forehead  . CAD (coronary artery disease) 06/21/1999  . 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 CHF (congestive heart failure) ([place]) 06/21/1999  . Chronic kidney disease 2010   stage 4 kidney failure  . Gout   . 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 Hyperlipidemia   . Hypertension 1998  . Thrombocytopenia ([place]) 2017  . Ventricular arrhythmia 2001   Past Surgical History  Past Surgical History:  Procedure Laterality Date  . CORONARY ARTERY BYPASS GRAFT  12/25/2015  . JOINT REPLACEMENT     both knees replaced  . KNEE SURGERY     bilateral  . SHOULDER SURGERY     left  . TEE WITHOUT CARDIOVERSION N/A 05/12/2017   Procedure: TRANSESOPHAGEAL ECHOCARDIOGRAM (TEE);  Surgeon: Larey Dresser, MD;  Location: [facility] ENDOSCOPY;  Service: Cardiovascular;  Laterality: N/A;   Family History  Family History  Problem Relation Age of Onset  .  Stroke Mother   . Heart disease Father 41  . Multiple myeloma Brother    Social History  reports that he quit smoking about 16 years ago. His smoking use included cigarettes. He started smoking about 46 years ago. He smoked 0.50 packs per day. he has never used smokeless tobacco. He reports that he drinks alcohol. He reports that he does not use drugs. Allergies  Allergies  Allergen Reactions  . Penicillins Hives    Has patient had a PCN reaction causing immediate rash, facial/tongue/throat swelling, SOB or lightheadedness with hypotension: No Has patient had a PCN reaction causing severe rash involving mucus membranes or skin necrosis: No Has patient had a PCN reaction that required hospitalization: No Has patient had a PCN reaction occurring within the last 10 years: No If all of the above answers are "NO", then may proceed with Cephalosporin use.   . Nsaids     Should avoid   Home medications Prior to Admission medications   Medication Sig Start Date End Date Taking? Authorizing Provider  allopurinol (ZYLOPRIM) 300 MG tablet Take 1 tablet (300 mg total) by mouth daily. 11/16/16  Yes [name], MD  aspirin EC 81 MG tablet Take 81 mg by mouth daily.   Yes [provider]  atorvastatin (LIPITOR) 40 MG tablet Take 1 tablet (40 mg total) by mouth daily at  6 PM. 11/16/16  Yes [name], MD  bumetanide (BUMEX) 1 MG tablet Take 2 mg (2 Tabs) in the AM and 1 mg (1 tab) in PM, alternating with 1 mg (1 tab), twice a day. 04/05/17  Yes Larey Dresser, MD  carvedilol (COREG) 3.125 MG tablet Take 1 tablet (3.125 mg total) 2 (two) times daily by mouth. 04/19/17  Yes Larey Dresser, MD  cholecalciferol (VITAMIN D) 1000 units tablet Take 2,000 Units by mouth daily.   Yes [provider]  CORLANOR 5 MG TABS tablet Take 5 mg by mouth 2 (two) times daily. 04/26/17  Yes [provider]  ferrous sulfate (IRON SUPPLEMENT) 325 (65 FE) MG tablet Take 325 mg by mouth  daily with breakfast.   Yes [provider]  Investigational - Study Medication Take 1 tablet by mouth 2 (two) times daily. Study name: Galactic Heart Failure Research Study Additional study details: Omecamtiv Mecarbil or Placebo 03/22/17  Yes Larey Dresser, MD  Multiple Vitamin (MULTIVITAMIN) capsule Take 1 capsule by mouth daily.   Yes [provider]  nitroGLYCERIN (NITROSTAT) 0.4 MG SL tablet Place 1 tablet (0.4 mg total) under the tongue every 5 (five) minutes as needed for chest pain. 12/01/16  Yes [name], MD  sacubitril-valsartan (ENTRESTO) 24-26 MG Take 1 tablet by mouth 2 (two) times daily. 02/02/17  Yes [name], MD  vitamin C (ASCORBIC ACID) 500 MG tablet Take 500 mg by mouth daily.   Yes [provider]   Liver Function Tests Recent Labs  Lab 05/12/17 0410 05/13/17 0444 05/14/17 0530  AST 22 19 33  ALT 36 28 37  ALKPHOS 58 62 167*  BILITOT 0.6 0.8 0.6  PROT 6.2* 5.9* 6.1*  ALBUMIN 3.5 3.3* 3.0*   No results for input(s): LIPASE, AMYLASE in the last 168 hours. CBC Recent Labs  Lab 05/09/17 1121  05/13/17 0444 05/13/17 1536 05/14/17 0530  WBC 8.5   < > 12.3* 10.3 9.9  NEUTROABS 6.8  --   --   --  8.2*  HGB 13.4   < > 8.3* 12.2* 11.5*  HCT 43.9   < > 25.1* 35.7* 35.6*  MCV 104.8*   < > 100.0 98.6 97.0  PLT 155   < > 137* 104* 110*   < > = values in this interval not displayed.   Basic Metabolic Panel Recent Labs  Lab 05/09/17 1121 05/04/2017 0827 05/11/17 0357 05/11/17 1856 05/12/17 0410 05/13/17 0444 05/14/17 0530  NA 138 135 135 133* 133* 131* 128*  K 4.3 4.0 3.4* 3.6 3.8 4.3 4.7  CL 101 103 102 100* 101 98* 95*  CO2 24 20* 23 23 25 24 23   GLUCOSE 115* 121* 141* 140* 146* 132* 134*  BUN 79* 88* 84* 78* 73* 69* 71*  CREATININE 2.73* 2.89* 2.75* 2.53* 2.36* 2.43* 2.89*  CALCIUM 9.6 8.8* 8.6* 8.8* 8.8* 8.6* 8.8*  PHOS  --  5.3* 5.0*  --  3.9 3.6 4.1   Iron/TIBC/Ferritin/ %Sat No results found for: IRON,  TIBC, FERRITIN, IRONPCTSAT  Vitals:   05/14/17 0700 05/14/17 0800 05/14/17 0900 05/14/17 1000  BP: (!) 87/63 104/75 101/78 97/72  Pulse: 96 98 (!) 102 99  Resp: (!) 21 (!) 22 (!) 22 19  Temp: (!) 97.3 F (36.3 C)     TempSrc: Oral     SpO2: (!) 88% 92% 93% 93%  Weight: 93 kg (205 lb 0.4 oz)     Height:  Exam Gen alert, up in chair No rash, cyanosis or gangrene Sclera anicteric, throat clear No jvd or bruits Chest bibasilar rales 1/3 up RRR 2/6 holosyst , no gallop Abd soft ntnd no mass or ascites +bs GU defer MS no joint effusions or deformity Ext 2+ bilat LE edema / no wounds or ulcers Neuro is alert, Ox 3 , nf  UA - negative 11/30 CXR - sig CHF 11/20 CVP - 20 range  Impression: 1. Acute on CRF - due to cardiorenal syndrome/ decomp CHF 2. ICM / EF 25% 3. CAD hx CABG/ CRT-D 4. Hx bio AoVR/ MV repair 5. Hypotension - cardiogenic shock on levo and milrinone gtts 6. Afib on amio gtt   Plan - plan consult CCM for HD access, start CRRT today. Have d/w pt who consents.    Kelly Splinter MD [employer] pager [phone]   05/14/2017, 10:45 AM

## 2017-05-14 NOTE — Procedures (Signed)
Dialysis catheter placement  The dialysis catheter was placed for CVVH to treat cardiorenal syndrome.  The patient was on apixaban so the procedure was delayed until late in the day.  After obtaining informed consent and performing a timeout, the area over the right subclavian was extensively prepped with chlorhexidine.  At this site was chosen as he already has a central line in the right IJ and a pacemaker in the left subclavian.  The area was widely draped in sterile garb was donned.  The vein was easily cannulated and a wire gently passed.  Serial dilators were passed and a 15 cm try Allises catheter subsequently placed over the wire.  There was good flow from all ports.  Chest x-ray shows good placement of the catheter and no pneumothorax.  There was no significant bleeding.

## 2017-05-14 NOTE — Progress Notes (Addendum)
PROGRESS NOTE    Dillon Cisneros  OEU:[id number] DOB: 28-Aug-1950 DOA: 05/09/2017 PCP: [name], MD  Brief Narrative:  Dillon Cisneros is a 66 y.o. male with ischemic cardiomyopathy with ICD in place and EF noted to be in the 20% range presented to ED at the direction of his heart failure team with shortness of breath for the last 2 days worsening after thanksgiving. He had been seen at primary care doctor office and sent here after discussion with Dr. [name] the heart failure clinic. Has had a shortness of breath with a dry cough for 2 days.  There was concern for pneumonia with his symptoms of persistent white sputum production. He denies fever and chills at this time.   The patient states this feels like this when he had pneumonia in the past. He has chronic biventricular heart failure and chronic pacemaker. He states he gained several pounds over Thanksgiving.  States his legs are more swollen than normal. In particular his left leg is more swollen than baseline.  It tested negative for DVT when checked in ED today.  The patient reports that he feels weak all over. His heart rate has been fast and he does describe them as palpitations. He was seen by heart failure clinic in Nov 2018 and had adjustment of his medications through the heart failure clinic to try and control his heart rate and improve his pacemaker effectiveness but he has been noted in ED to be in afib/flutter which is considered new onset per ED physician.     Cardiology was consulted to see him and they requested that he be admitted to [facility] so that he can be followed by the heart failure team.  The heart failure team was also consulted saw patient this AM.  The patient denies having chest pain but has been increasing productive white frothy sputum and SOB.    Patient underwent TEE/DCCV 04/30/2017. During the day on 11/27 he became dyspneic and lightheaded as well as hypotensive and was transferred to ICU and had  a Central Line placed and started on Pressors for Cardiogenic Shock. Started having increased PVC's so Amiodarone gtt was increased. Felt better yesterday but became more SOB overnight and symptomatic so had O2 increased.  Cardiology has discontinued Dobutamine. Primacor (Milrinone) has been started. Nephrology consulted. IV Amiodarone being continued and IV Heparin gtt transitioned to po Eliquis.   Assessment & Plan:   Principal Problem:   Pneumonia Active Problems:   Hx of CABG   CAD (coronary artery disease)   HTN (hypertension)   Stage 4 chronic kidney disease (HCC)   HLD (hyperlipidemia)   Chronic combined systolic and diastolic CHF (congestive heart failure) (HCC)   H/O aortic valve replacement   ICD (implantable cardioverter-defibrillator) in place   Acute on chronic systolic heart failure (HCC)   Hypotension   Research study patient   Atrial fibrillation (HCC)   Elevated brain natriuretic peptide (BNP) level   Aspiration pneumonia (HCC)   Cardiogenic shock ([place])  Cardiogenic Shock -Transferred to ICU. Now on Milrinone. Dobutamine is discontinued. On Norepinephrine. Nephrology consulted due to worsening renal function and volume overload. -Cardiology Managing Pressors and appreciate their help -Patient may need CRRT  Acute on Chronic combined systolic and Diastolic Heart Failure  -Pt continues to gain weight, with decreased urine output.   -Recent ECHO showed EF of 20-25% -Cardiology to did TEE/DCCV 11/27 -Hold Entresto given AKI and Hypotension; Cardiology also holding Coreg -Getting Galactic study drug as well -Strict  I's/O's, Daily Weights -Patient may need CRRT  Community Acquired Pneumonia  - Will check Procalcitonin. CXR findings could well be secondary to CHF. -blood culture and sputum cultures have been ordered. Blood culture is non revealing. -Patient is already on IV ceftriaxone/azithromycin. Low threshold to DC Antibiotics. -Aspiration precautions ordered,  elevate head of bed.        Atrial Flutter with RVR -This appears to be new Onset  -He has been on coreg and taking it and was started recently on corlanor. -Cardiology consulted and placed patient on Amiodarone gtt and Heparin gtt -CHADS2VASc of at least 3 -S/p TEE/DCCV 11/27 -BiV Pacing; May need Lead Revision in Future  -C/w Amiodarone gtt and Eliquis. -EP evaluated and they cannot tell what rhythm is today and plan to re-evaluate in AM; Device Rep to Interrogate Device to confirm Sinus Rhythm and evaluate LV/BiV pacing   Chronic Hypotension - Likely related to severe CHF.  -Hold Coreg and Entresto (patient renal function is also worsening) -Now on Pressor Support and being titrated.  AKI on Stage 3 CKD  -Cr worsened intially and ? Cardiorenal Syndrome;   -Renal consulted. -Wll need to renally dose medications and follow closely in the setting of ongoing diuresis.    CAD s/p CABG  -stable, no chest pain symptoms.    -Resume aspirin and atorvastatin.   Hypokalemia -Resolved.   Hyponatremia - Secondary to CHF. Prognostic significance in CHF.  Acute Normocytic Anemia - Likely multifactorial - Monitor.  Gout - -Resume home allopurinol daily.     DVT prophylaxis: Apixaban Code Status: FULL CODE Family Communication: No family present at bedside Disposition Plan: Remain Inpateint in ICU   Consultants:   Cardiology Heart Failure Team  Cardiology EP  Nephrology  Procedures:   [place] placed 11/27   Antimicrobials:  Anti-infectives (From admission, onward)   Start     Dose/Rate Route Frequency Ordered Stop   04/23/2017 1400  cefTRIAXone (ROCEPHIN) 1 g in dextrose 5 % 50 mL IVPB     1 g 100 mL/hr over 30 Minutes Intravenous Every 24 hours 05/09/17 1742     05/12/2017 1400  azithromycin (ZITHROMAX) 500 mg in dextrose 5 % 250 mL IVPB     500 mg 250 mL/hr over 60 Minutes Intravenous Every 24 hours 05/09/17 1742 05/15/17 1359   05/09/17 1315   cefTRIAXone (ROCEPHIN) 1 g in dextrose 5 % 50 mL IVPB     1 g 100 mL/hr over 30 Minutes Intravenous  Once 05/09/17 1312 05/09/17 1358   05/09/17 1315  azithromycin (ZITHROMAX) 500 mg in dextrose 5 % 250 mL IVPB     500 mg 250 mL/hr over 60 Minutes Intravenous  Once 05/09/17 1312 05/09/17 1505     Subjective: Seen alongside patient's Nurse. Continues to report worsening edema. Also reports early satiety. Nephrology consulted for possible CRRT.  Objective: Vitals:   05/14/17 0528 05/14/17 0600 05/14/17 0700 05/14/17 0800  BP:  100/70 (!) 87/63 104/75  Pulse: 99 97 96 98  Resp: (!) 23 15 (!) 21 (!) 22  Temp:   (!) 97.3 F (36.3 C)   TempSrc:   Oral   SpO2: 94% 95% (!) 88% 92%  Weight:   93 kg (205 lb 0.4 oz)   Height:        Intake/Output Summary (Last 24 hours) at 05/14/2017 0957 Last data filed at 05/14/2017 0931 Gross per 24 hour  Intake 3358.79 ml  Output 1265 ml  Net 2093.79 ml  Filed Weights   05/12/17 0433 05/13/17 0700 05/14/17 0700  Weight: 95.3 kg (210 lb 1.6 oz) 91.4 kg (201 lb 8 oz) 93 kg (205 lb 0.4 oz)   Examination: Physical Exam:  Constitutional: WN/WD Caucasian male in NAD. Sitting in chair bedside  Eyes: Sclerae anicteric. Lids Normal ENMT: External Ears and nose appear normal Neck: Supple with JVP Respiratory: Diminished with some diffuse crackles. No appreciable wheezing or rhonchi Cardiovascular: RRR; S1, S2, and +S3. Has 2+ to 3 LE edema Abdomen: Soft, NT, ND. Bowel sounds present GU: Deferred Musculoskeletal: No contractures. No cyanosis Skin: Warm and Dry. No appreciable rashes or lesions on a limited skin eval Neurologic: CN 2-12 grossly intact. No appreciable focal deficits Psychiatric: Normal mood and affect. Intact judgement and insight  Data Reviewed: I have personally reviewed following labs and imaging studies  CBC: Recent Labs  Lab 05/09/17 1121  05/11/17 0357 05/12/17 0410 05/13/17 0444 05/13/17 1536 05/14/17 0530  WBC 8.5    < > 8.1 10.0 12.3* 10.3 9.9  NEUTROABS 6.8  --   --   --   --   --  8.2*  HGB 13.4   < > 11.7* 11.6* 8.3* 12.2* 11.5*  HCT 43.9   < > 36.1* 35.3* 25.1* 35.7* 35.6*  MCV 104.8*   < > 99.4 98.9 100.0 98.6 97.0  PLT 155   < > 128* 114* 137* 104* 110*   < > = values in this interval not displayed.   Basic Metabolic Panel: Recent Labs  Lab 04/23/2017 0827 05/11/17 0357 05/11/17 1856 05/12/17 0410 05/13/17 0444 05/14/17 0530  NA 135 135 133* 133* 131* 128*  K 4.0 3.4* 3.6 3.8 4.3 4.7  CL 103 102 100* 101 98* 95*  CO2 20* 23 23 25 24 23   GLUCOSE 121* 141* 140* 146* 132* 134*  BUN 88* 84* 78* 73* 69* 71*  CREATININE 2.89* 2.75* 2.53* 2.36* 2.43* 2.89*  CALCIUM 8.8* 8.6* 8.8* 8.8* 8.6* 8.8*  MG 2.6* 2.6* 2.5* 2.2 2.2 2.3  PHOS 5.3* 5.0*  --  3.9 3.6 4.1   GFR: Estimated Creatinine Clearance: 26.6 mL/min (A) (by C-G formula based on SCr of 2.89 mg/dL (H)). Liver Function Tests: Recent Labs  Lab 05/13/2017 0827 05/11/17 0357 05/12/17 0410 05/13/17 0444 05/14/17 0530  AST 37 28 22 19  33  ALT 52 44 36 28 37  ALKPHOS 68 64 58 62 167*  BILITOT 0.7 0.7 0.6 0.8 0.6  PROT 6.3* 5.8* 6.2* 5.9* 6.1*  ALBUMIN 3.6 3.3* 3.5 3.3* 3.0*   No results for input(s): LIPASE, AMYLASE in the last 168 hours. No results for input(s): AMMONIA in the last 168 hours. Coagulation Profile: No results for input(s): INR, PROTIME in the last 168 hours. Cardiac Enzymes: Recent Labs  Lab 05/09/17 1121 05/09/17 1607  TROPONINI 0.07* 0.07*   BNP (last 3 results) No results for input(s): PROBNP in the last 8760 hours. HbA1C: No results for input(s): HGBA1C in the last 72 hours. CBG: No results for input(s): GLUCAP in the last 168 hours. Lipid Profile: No results for input(s): CHOL, HDL, LDLCALC, TRIG, CHOLHDL, LDLDIRECT in the last 72 hours. Thyroid Function Tests: No results for input(s): TSH, T4TOTAL, FREET4, T3FREE, THYROIDAB in the last 72 hours. Anemia Panel: No results for input(s): VITAMINB12,  FOLATE, FERRITIN, TIBC, IRON, RETICCTPCT in the last 72 hours. Sepsis Labs: No results for input(s): PROCALCITON, LATICACIDVEN in the last 168 hours.  Recent Results (from the past 240 hour(s))  Respiratory Panel by  PCR     Status: None   Collection Time: 05/09/17  3:49 PM  Result Value Ref Range Status   Adenovirus NOT DETECTED NOT DETECTED Final   Coronavirus 229E NOT DETECTED NOT DETECTED Final   Coronavirus HKU1 NOT DETECTED NOT DETECTED Final   Coronavirus NL63 NOT DETECTED NOT DETECTED Final   Coronavirus OC43 NOT DETECTED NOT DETECTED Final   Metapneumovirus NOT DETECTED NOT DETECTED Final   Rhinovirus / Enterovirus NOT DETECTED NOT DETECTED Final   Influenza A NOT DETECTED NOT DETECTED Final   Influenza B NOT DETECTED NOT DETECTED Final   Parainfluenza Virus 1 NOT DETECTED NOT DETECTED Final   Parainfluenza Virus 2 NOT DETECTED NOT DETECTED Final   Parainfluenza Virus 3 NOT DETECTED NOT DETECTED Final   Parainfluenza Virus 4 NOT DETECTED NOT DETECTED Final   Respiratory Syncytial Virus NOT DETECTED NOT DETECTED Final   Bordetella pertussis NOT DETECTED NOT DETECTED Final   Chlamydophila pneumoniae NOT DETECTED NOT DETECTED Final   Mycoplasma pneumoniae NOT DETECTED NOT DETECTED Final    Comment: Performed at [facility], [place] [address], [place], [place] [address]  Culture, blood (routine x 2) Call MD if unable to obtain prior to antibiotics being given     Status: None   Collection Time: 05/09/17  4:14 PM  Result Value Ref Range Status   Specimen Description LEFT ANTECUBITAL  Final   Special Requests   Final    BOTTLES DRAWN AEROBIC AND ANAEROBIC Blood Culture adequate volume   Culture NO GROWTH 5 DAYS  Final   Report Status 05/14/2017 FINAL  Final  Culture, blood (routine x 2) Call MD if unable to obtain prior to antibiotics being given     Status: None   Collection Time: 05/09/17  4:19 PM  Result Value Ref Range Status   Specimen Description BLOOD LEFT ARM  Final    Special Requests   Final    BOTTLES DRAWN AEROBIC AND ANAEROBIC Blood Culture adequate volume   Culture NO GROWTH 5 DAYS  Final   Report Status 05/14/2017 FINAL  Final  MRSA PCR Screening     Status: None   Collection Time: 04/26/2017  6:50 PM  Result Value Ref Range Status   MRSA by PCR NEGATIVE NEGATIVE Final    Comment:        The GeneXpert MRSA Assay (FDA approved for NASAL specimens only), is one component of a comprehensive MRSA colonization surveillance program. It is not intended to diagnose MRSA infection nor to guide or monitor treatment for MRSA infections.     Radiology Studies: Dg Chest Port 1 View  Result Date: 05/13/2017 CLINICAL DATA:  Shortness of Breath EXAM: PORTABLE CHEST 1 VIEW COMPARISON:  04/20/2017 FINDINGS: Left AICD remains in place, unchanged. Right central line is unchanged. Cardiomegaly with bilateral airspace opacities and layering effusions compatible with CHF. No change. IMPRESSION: Moderate CHF and layering effusions.  No change since prior study. Electronically Signed   By: Rolm Baptise M.D.   On: 05/13/2017 09:50   Scheduled Meds: . allopurinol  300 mg Oral Daily  . apixaban  5 mg Oral BID  . aspirin EC  81 mg Oral Daily  . atorvastatin  40 mg Oral q1800  . Chlorhexidine Gluconate Cloth  6 each Topical Daily  . GALACTIC Study - Placebo / omecamtiv mecarbil ([facility])  1 tablet Oral BID  . guaiFENesin  600 mg Oral BID  . mouth rinse  15 mL Mouth Rinse BID  .  metolazone  5 mg Oral BID  . multivitamin with minerals  1 tablet Oral Daily  . potassium chloride  40 mEq Oral TID  . sodium chloride flush  10-40 mL Intracatheter Q12H  . sodium chloride flush  3 mL Intravenous Q12H  . sodium chloride flush  3 mL Intravenous Q12H   Continuous Infusions: . sodium chloride    . sodium chloride    . sodium chloride    . amiodarone 30 mg/hr (05/14/17 0725)  . azithromycin Stopped (05/13/17 1724)  . cefTRIAXone (ROCEPHIN)  IV Stopped (05/13/17 1724)   . furosemide Stopped (05/14/17 0924)  . milrinone 0.25 mcg/kg/min (05/14/17 0752)  . milrinone    . norepinephrine (LEVOPHED) Adult infusion 9 mcg/min (05/14/17 6861)    LOS: 5 days   Bonnell Public, MD [facility] Pager [phone].  If 7PM-7AM, please contact night-coverage [email or web address] Password [email or web address] 05/14/2017, 9:57 AM

## 2017-05-14 NOTE — Progress Notes (Signed)
ANTICOAGULATION CONSULT NOTE - Follow-Up Consult  Pharmacy Consult for heparin > apixaban > heparin Indication: atrial fibrillation  Allergies  Allergen Reactions  . Penicillins Hives    Has patient had a PCN reaction causing immediate rash, facial/tongue/throat swelling, SOB or lightheadedness with hypotension: No Has patient had a PCN reaction causing severe rash involving mucus membranes or skin necrosis: No Has patient had a PCN reaction that required hospitalization: No Has patient had a PCN reaction occurring within the last 10 years: No If all of the above answers are "NO", then may proceed with Cephalosporin use.   . Nsaids     Should avoid    Patient Measurements: Height: 5\' 7"  (170.2 cm) Weight: 205 lb 0.4 oz (93 kg) IBW/kg (Calculated) : 66.1 Heparin Dosing Weight: 84.5kg  Vital Signs: Temp: 97.3 F (36.3 C) (12/01 0700) Temp Source: Oral (12/01 0700) BP: 101/63 (12/01 1100) Pulse Rate: 101 (12/01 1100)  Labs: Recent Labs    05/12/17 0410 05/13/17 0444 05/13/17 1536 05/14/17 0530  HGB 11.6* 8.3* 12.2* 11.5*  HCT 35.3* 25.1* 35.7* 35.6*  PLT 114* 137* 104* 110*  HEPARINUNFRC 0.30  --   --   --   CREATININE 2.36* 2.43*  --  2.89*    Estimated Creatinine Clearance: 26.6 mL/min (A) (by C-G formula based on SCr of 2.89 mg/dL (H)).   Medical History: Past Medical History:  Diagnosis Date  . Anemia   . Arthritis    arthritis  . Atrial fibrillation ([place]) 12/28/2015  . Basal cell carcinoma 2011   scalp/neck/forehead  . CAD (coronary artery disease) 06/21/1999  . 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 CHF (congestive heart failure) ([place]) 06/21/1999  . Chronic kidney disease 2010   stage 4 kidney failure  . Gout   . 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 Hyperlipidemia   . Hypertension 1998  . Thrombocytopenia ([place]) 2017  . Ventricular arrhythmia 2001     Assessment: 59 YOM transferred from [facility] with A-flutter. Underwent successful TEE-DCCV.  Patient was not on anticoagulation PTA.   Patietn was previously on heparin drip for afib and transitioned to apixaban post DCCV.  Since then has developed HF exacerbation with volume overload - not responding to diuretics> plan to start CRT.  Last dose apixaban 12/1 am plan for HD cath placement late today and start heparin drip for Afib after line placed.  Will use aptt for heparin dosing given apixaban will alter the HL.   No signs/symptoms of bleeding.   Goal of Therapy:  APTT goal 66-102 sec Heparin level 0.3-0.7 units/ml Monitor platelets by anticoagulation protocol: Yes   Plan:  Stop apixaban Begin heparin drip about 2200 tonight after HD cath placed by [place].D. CPP, BCPS Clinical Pharmacist [phone] 05/14/2017 12:19 PM

## 2017-05-14 NOTE — Progress Notes (Signed)
Patient ID: Dillon Cisneros, male   DOB: 1951-02-01, 66 y.o.   MRN: [id number]     Advanced Heart Failure Rounding Note  PCP: Meda Coffee Cardiologist: Aundra Dubin  Subjective:   11/27 S/P TEE-guided DCCV from flutter to NSR with improved BiV pacing.   11/27developed acute dyspnea and dizziness. Frequent PVCs. He was hypotensive. Amio increased to 60 mg per hour, norepi added, and dobutamine 2.5 mcg started with low co-ox.   He is now on Lasix gtt at 20 mg/hr with metolazone 10 mg bid + acetazolamide 250 mg (written for 2 doses to look for response).  Co-ox 71% today, CVP 21.  Still with poor urine output.  Creatinine 2.43 => 2.89.   Dyspnea with ambulation, feels ok at rest.   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   Objective:   Weight Range: 201 lb 8 oz (91.4 kg) Body mass index is 31.56 kg/m.   Vital Signs:   Temp:  [97.5 F (36.4 C)-99 F (37.2 C)] 98.3 F (36.8 C) (11/30 2316) Pulse Rate:  [96-110] 96 (12/01 0700) Resp:  [14-26] 21 (12/01 0700) BP: (83-112)/(46-84) 87/63 (12/01 0700) SpO2:  [88 %-100 %] 88 % (12/01 0700) Last BM Date: 05/13/17  Weight change: Filed Weights   05/11/17 0500 05/12/17 0433 05/13/17 0700  Weight: 213 lb 10 oz (96.9 kg) 210 lb 1.6 oz (95.3 kg) 201 lb 8 oz (91.4 kg)    Intake/Output:   Intake/Output Summary (Last 24 hours) at 05/14/2017 0726 Last data filed at 05/14/2017 0700 Gross per 24 hour  Intake 3365.79 ml  Output 1115 ml  Net 2250.79 ml      Physical Exam  CVP 21 General: NAD Neck: JVP to jaw, no thyromegaly or thyroid nodule.  Lungs: Crackles at bases CV: Lateral PMI.  Heart regular S1/S2, no S3/S4, 1/6 SEM RUSB. 1+ edema to knees bilaterally.   Abdomen: Soft, nontender, no hepatosplenomegaly, no distention.  Skin: Intact without lesions or rashes.  Neurologic: Alert and  oriented x 3.  Psych: Normal affect. Extremities: No clubbing or cyanosis.  HEENT: Normal.   Telemetry   NSR, BiV paced   Labs    CBC Recent Labs    05/13/17 1536 05/14/17 0530  WBC 10.3 9.9  NEUTROABS  --  8.2*  HGB 12.2* 11.5*  HCT 35.7* 35.6*  MCV 98.6 97.0  PLT 104* 270*   Basic Metabolic Panel Recent Labs    05/13/17 0444 05/14/17 0530  NA 131* 128*  K 4.3 4.7  CL 98* 95*  CO2 24 23  GLUCOSE 132* 134*  BUN 69* 71*  CREATININE 2.43* 2.89*  CALCIUM 8.6* 8.8*  MG 2.2 2.3  PHOS 3.6 4.1   Liver Function Tests Recent Labs    05/13/17 0444 05/14/17 0530  AST 19 33  ALT 28 37  ALKPHOS 62 167*  BILITOT 0.8 0.6  PROT 5.9* 6.1*  ALBUMIN 3.3* 3.0*   No results for input(s): LIPASE, AMYLASE in the last 72 hours. Cardiac Enzymes No results for input(s): CKTOTAL, CKMB, CKMBINDEX, TROPONINI in the last 72 hours.  BNP: BNP (last 3 results) Recent Labs    05/11/17 0357 05/12/17 0410 05/13/17 0444  BNP 1,412.5* 1,720.5* 2,177.6*    ProBNP (last 3 results) No results for input(s): PROBNP in the last 8760 hours.   D-Dimer No results for input(s): DDIMER in the last 72 hours. Hemoglobin  A1C No results for input(s): HGBA1C in the last 72 hours. Fasting Lipid Panel No results for input(s): CHOL, HDL, LDLCALC, TRIG, CHOLHDL, LDLDIRECT in the last 72 hours. Thyroid Function Tests No results for input(s): TSH, T4TOTAL, T3FREE, THYROIDAB in the last 72 hours.  Invalid input(s): FREET3  Other results:   Imaging    Dg Chest Port 1 View  Result Date: 05/13/2017 CLINICAL DATA:  Shortness of Breath EXAM: PORTABLE CHEST 1 VIEW COMPARISON:  04/18/2017 FINDINGS: Left AICD remains in place, unchanged. Right central line is unchanged. Cardiomegaly with bilateral airspace opacities and layering effusions compatible with CHF. No change. IMPRESSION: Moderate CHF and layering effusions.  No change since prior study. Electronically Signed   By: Rolm Baptise M.D.    On: 05/13/2017 09:50     Medications:     Scheduled Medications: . acetaZOLAMIDE  250 mg Oral BID  . allopurinol  300 mg Oral Daily  . apixaban  5 mg Oral BID  . aspirin EC  81 mg Oral Daily  . atorvastatin  40 mg Oral q1800  . Chlorhexidine Gluconate Cloth  6 each Topical Daily  . GALACTIC Study - Placebo / omecamtiv mecarbil (PI-Isra Lindy)  1 tablet Oral BID  . guaiFENesin  600 mg Oral BID  . mouth rinse  15 mL Mouth Rinse BID  . metolazone  5 mg Oral BID  . multivitamin with minerals  1 tablet Oral Daily  . potassium chloride  40 mEq Oral TID  . sodium chloride flush  10-40 mL Intracatheter Q12H  . sodium chloride flush  3 mL Intravenous Q12H  . sodium chloride flush  3 mL Intravenous Q12H    Infusions: . sodium chloride    . sodium chloride    . sodium chloride    . amiodarone 60 mg/hr (05/14/17 0600)  . azithromycin Stopped (05/13/17 1724)  . cefTRIAXone (ROCEPHIN)  IV Stopped (05/13/17 1724)  . DOBUTamine 2.5 mcg/kg/min (05/14/17 0600)  . furosemide (LASIX) infusion 20 mg/hr (05/14/17 0600)  . norepinephrine (LEVOPHED) Adult infusion 9.013 mcg/min (05/14/17 0600)    PRN Medications: sodium chloride, Place/Maintain arterial line **AND** sodium chloride, acetaminophen, nitroGLYCERIN, ondansetron (ZOFRAN) IV, sodium chloride flush, sodium chloride flush, sodium chloride flush    Patient Profile   66 yo with history of CAD, aortic and mitral valve disease, ischemic cardiomyopathy, and CKD stage 3 was admitted with acute on chronic systolic CHF, ?PNA, AKI on CKD, and atrial flutter.   Assessment/Plan   1. Cardiogenic Shock: Started on norepi and dobutamine. SBP stable in 90s-100s.  CO-OX 71%.  2. Acute on chronic systolic CHF: Ischemic cardiomyopathy.  EF 20-25% on 6/18 echo.  [employer] CRT-D device.  CVP 21.  He is markedly volume overloaded but still not diuresing despite Lasix 20 mg/hr, acetazolamide, and metolazone 5 mg bid. Creatinine up to 2.89.  Co-ox remains good  at 71%.  Long-term prognosis now very concerning.  With renal dysfunction, he is not a good LVAD candidate. - Though co-ox looks good, I will see if he responds better to milrinone => stop dobutamine and transition to milrinone 0.25.  Continue norepinephrine as needed to maintain MAP, today at 9.  - Continue Lasix but change back to boluses 160 mg IV bid and continue metolazone 5 mg bid.   - Decrease KCl to bid.  - Will give dose tolvaptan today with sodium down to 128 to see if this helps with diuresis.  - With rising creatinine and poor UOP with CVP still so  high, will consult renal.  I am concerned that he will need CVVH.  - No Entresto with AKI and low BP.  Will hold Coreg with acute exacerbation.   - Did not tolerate spironolactone in the past with hyperkalemia.   - Continue Galactic study drug.  - At baseline, percentage of BiV pacing is below goal.  Likely due to mode switching with baseline sinus tachy and faulty atrial lead.  Will need atrial lead re-worked after he recovers from current episode. EP following.  3. CAD: S/p CABG.  No chest pain.  TnI mildly elevated with no trend, likely due to demand ischemia/volume overload. No chest pain.  - Continue ASA 81.  - Continue atorvastatin 40 daily.  4. Bioprosthetic aortic valve: Stable on last echo.  5. S/p mitral valve repair: Stable on last echo.  6. AKI on CKD stage 3.  Baseline creatinine 1.9. Creatinine today 2.89.    7. Atrial flutter: New for him, with RVR.  Has triggered CHF and likely AKI.  S/P TEE-guided DC-CV.  Today in NSR with BiV pacing with PVCs.  - Continue amio drip, can decrease down to 30 mg/hr.  - Continue eliquis 5 mg twice a day.  8. ?PNA: Bibasilar airspace disease.  He is on azithromycin/ceftriaxone empirically.  9. Frequent PVCs/NSVT: Continue amio drip.   Length of Stay: 5  CRITICAL CARE Performed by: Loralie Champagne  Total critical care time: 35 minutes  Critical care time was exclusive of separately  billable procedures and treating other patients.  Critical care was necessary to treat or prevent imminent or life-threatening deterioration.  Critical care was time spent personally by me on the following activities: development of treatment plan with patient and/or surrogate as well as nursing, discussions with consultants, evaluation of patient's response to treatment, examination of patient, obtaining history from patient or surrogate, ordering and performing treatments and interventions, ordering and review of laboratory studies, ordering and review of radiographic studies, pulse oximetry and re-evaluation of patient's condition.  Loralie Champagne 05/14/2017 7:26 AM

## 2017-05-14 NOTE — Progress Notes (Signed)
ANTICOAGULATION CONSULT NOTE - Follow-Up Consult  Pharmacy Consult for heparin > apixaban > heparin Indication: atrial fibrillation  Allergies  Allergen Reactions  . Penicillins Hives    Has patient had a PCN reaction causing immediate rash, facial/tongue/throat swelling, SOB or lightheadedness with hypotension: No Has patient had a PCN reaction causing severe rash involving mucus membranes or skin necrosis: No Has patient had a PCN reaction that required hospitalization: No Has patient had a PCN reaction occurring within the last 10 years: No If all of the above answers are "NO", then may proceed with Cephalosporin use.   . Nsaids     Should avoid    Patient Measurements: Height: 5\' 7"  (170.2 cm) Weight: 205 lb 0.4 oz (93 kg) IBW/kg (Calculated) : 66.1 Heparin Dosing Weight: 84.5kg  Vital Signs: Temp: 97.6 F (36.4 C) (12/01 1614) Temp Source: Oral (12/01 1614) BP: 87/66 (12/01 1614) Pulse Rate: 111 (12/01 1700)  Labs: Recent Labs    05/12/17 0410 05/13/17 0444 05/13/17 1536 05/14/17 0530 05/14/17 1646  HGB 11.6* 8.3* 12.2* 11.5*  --   HCT 35.3* 25.1* 35.7* 35.6*  --   PLT 114* 137* 104* 110*  --   HEPARINUNFRC 0.30  --   --   --   --   CREATININE 2.36* 2.43*  --  2.89* 3.27*    Estimated Creatinine Clearance: 23.5 mL/min (A) (by C-G formula based on SCr of 3.27 mg/dL (H)).   Medical History: Past Medical History:  Diagnosis Date  . Anemia   . Arthritis    arthritis  . Atrial fibrillation ([place]) 12/28/2015  . Basal cell carcinoma 2011   scalp/neck/forehead  . CAD (coronary artery disease) 06/21/1999  . 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 CHF (congestive heart failure) ([place]) 06/21/1999  . Chronic kidney disease 2010   stage 4 kidney failure  . Gout   . 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 Hyperlipidemia   . Hypertension 1998  . Thrombocytopenia ([place]) 2017   . Ventricular arrhythmia 2001    Assessment: 70 YOM transferred from [facility] with A-flutter. Underwent successful TEE-DCCV.  Patient was not on anticoagulation PTA.   Patietn was previously on heparin drip for afib and transitioned to apixaban post DCCV.  Since then has developed HF exacerbation with volume overload - not responding to diuretics> plan to start CRT.  Last dose apixaban 12/1 am plan for HD cath placement late today and start heparin drip for Afib after line placed.  Will use aptt for heparin dosing given apixaban will alter the HL.   No signs/symptoms of bleeding.   Dialysis catheter placed by CCM this evening. Will start heparin this evening as planned.   Goal of Therapy:  APTT goal 66-102 sec Heparin level 0.3-0.7 units/ml Monitor platelets by anticoagulation protocol: Yes   Plan:  1. Begin heparin infusion at 1300 units/hr tonight at 22:00 (when next dose of apixaban would have been due) 2. Heparin level and aPTT 8 hours after starting infusion   Vincenza Hews, PharmD, BCPS 05/14/2017, 6:16 PM

## 2017-05-14 NOTE — Progress Notes (Signed)
PT Cancellation Note  Patient Details Name: Dillon Cisneros MRN: [id number] DOB: 08-31-1950   Cancelled Treatment:    Reason Eval/Treat Not Completed: Patient at procedure or test/unavailable;Fatigue/lethargy limiting ability to participate. Patient had been ambulating with nursing x2 today and up in chair.  As PT entered room, RN's connecting CRRT device to patient.  PT will return tomorrow for PT evaluation if appropriate.   Despina Pole 05/14/2017, 6:26 PM Carita Pian. Sanjuana Kava, [place] Pager [phone]

## 2017-05-14 DEATH — deceased

## 2017-05-15 LAB — CBC
HEMATOCRIT: 34.7 % — AB (ref 39.0–52.0)
Hemoglobin: 11.4 g/dL — ABNORMAL LOW (ref 13.0–17.0)
MCH: 32.1 pg (ref 26.0–34.0)
MCHC: 32.9 g/dL (ref 30.0–36.0)
MCV: 97.7 fL (ref 78.0–100.0)
Platelets: 123 10*3/uL — ABNORMAL LOW (ref 150–400)
RBC: 3.55 MIL/uL — ABNORMAL LOW (ref 4.22–5.81)
RDW: 16.7 % — AB (ref 11.5–15.5)
WBC: 10.1 10*3/uL (ref 4.0–10.5)

## 2017-05-15 LAB — RENAL FUNCTION PANEL
ANION GAP: 10 (ref 5–15)
ANION GAP: 6 (ref 5–15)
Albumin: 3 g/dL — ABNORMAL LOW (ref 3.5–5.0)
Albumin: 2.8 g/dL — ABNORMAL LOW (ref 3.5–5.0)
BUN: 56 mg/dL — AB (ref 6–20)
BUN: 42 mg/dL — AB (ref 6–20)
CHLORIDE: 96 mmol/L — AB (ref 101–111)
CHLORIDE: 98 mmol/L — AB (ref 101–111)
CO2: 24 mmol/L (ref 22–32)
CO2: 27 mmol/L (ref 22–32)
Calcium: 8.5 mg/dL — ABNORMAL LOW (ref 8.9–10.3)
Calcium: 8.4 mg/dL — ABNORMAL LOW (ref 8.9–10.3)
Creatinine, Ser: 2.36 mg/dL — ABNORMAL HIGH (ref 0.61–1.24)
Creatinine, Ser: 2.17 mg/dL — ABNORMAL HIGH (ref 0.61–1.24)
GFR calc Af Amer: 31 mL/min — ABNORMAL LOW (ref >60)
GFR calc Af Amer: 34 mL/min — ABNORMAL LOW (ref >60)
GFR calc non Af Amer: 30 mL/min — ABNORMAL LOW (ref >60)
GFR, EST NON AFRICAN AMERICAN: 27 mL/min — AB (ref >60)
GLUCOSE: 161 mg/dL — AB (ref 65–99)
Glucose, Bld: 112 mg/dL — ABNORMAL HIGH (ref 65–99)
POTASSIUM: 4.5 mmol/L (ref 3.5–5.1)
POTASSIUM: 4.5 mmol/L (ref 3.5–5.1)
Phosphorus: 3.3 mg/dL (ref 2.5–4.6)
Phosphorus: 2.8 mg/dL (ref 2.5–4.6)
Sodium: 130 mmol/L — ABNORMAL LOW (ref 135–145)
Sodium: 131 mmol/L — ABNORMAL LOW (ref 135–145)

## 2017-05-15 LAB — COOXEMETRY PANEL
Carboxyhemoglobin: 1.1 % (ref 0.5–1.5)
Methemoglobin: 1.2 % (ref 0.0–1.5)
O2 Saturation: 82.5 %
Total hemoglobin: 11.5 g/dL — ABNORMAL LOW (ref 12.0–16.0)

## 2017-05-15 LAB — HEPARIN LEVEL (UNFRACTIONATED): Heparin Unfractionated: >2.2 IU/mL — ABNORMAL HIGH (ref 0.30–0.70)

## 2017-05-15 LAB — APTT
aPTT: 64 seconds — ABNORMAL HIGH (ref 24–36)
aPTT: 66 seconds — ABNORMAL HIGH (ref 24–36)

## 2017-05-15 LAB — MAGNESIUM: Magnesium: 2.5 mg/dL — ABNORMAL HIGH (ref 1.7–2.4)

## 2017-05-15 MED ORDER — "THROMBI-PAD 3""X3"" EX PADS"
1.0000 | MEDICATED_PAD | Freq: Once | CUTANEOUS | Status: DC
  Filled 2017-05-15: qty 1

## 2017-05-15 NOTE — Progress Notes (Signed)
ANTICOAGULATION CONSULT NOTE - Follow Up Consult  Pharmacy Consult for heparin Indication: atrial fibrillation  Labs: Recent Labs    05/13/17 0444 05/13/17 1536 05/14/17 0530 05/14/17 1646 05/15/17 0459  HGB 8.3* 12.2* 11.5*  --  11.4*  HCT 25.1* 35.7* 35.6*  --  34.7*  PLT 137* 104* 110*  --  123*  APTT  --   --   --   --  64*  CREATININE 2.43*  --  2.89* 3.27*  --     Assessment: 66yo male subtherapeutic on heparin with initial dosing while Eliquis on hold.  Goal of Therapy:  aPTT 66-102 seconds   Plan:  Will increase heparin gtt slightly to 1400 units/hr and check PTT in [place], PharmD, BCPS  05/15/2017,6:07 AM

## 2017-05-15 NOTE — Progress Notes (Signed)
PROGRESS NOTE    Dillon Cisneros  CWC:[id number] DOB: 1950-09-21 DOA: 05/09/2017 PCP: [name], MD  Brief Narrative:  Dillon Cisneros is a 66 y.o. male with ischemic cardiomyopathy with ICD in place and EF noted to be in the 20% range presented to ED at the direction of his heart failure team with shortness of breath for the last 2 days worsening after thanksgiving. He had been seen at primary care doctor office and sent here after discussion with Dr. [name] the heart failure clinic. Has had a shortness of breath with a dry cough for 2 days.  There was concern for pneumonia with his symptoms of persistent white sputum production. He denies fever and chills at this time.   The patient states this feels like this when he had pneumonia in the past. He has chronic biventricular heart failure and chronic pacemaker. He states he gained several pounds over Thanksgiving.  States his legs are more swollen than normal. In particular his left leg is more swollen than baseline.  It tested negative for DVT when checked in ED today.  The patient reports that he feels weak all over. His heart rate has been fast and he does describe them as palpitations. He was seen by heart failure clinic in Nov 2018 and had adjustment of his medications through the heart failure clinic to try and control his heart rate and improve his pacemaker effectiveness but he has been noted in ED to be in afib/flutter which is considered new onset per ED physician.     Cardiology was consulted to see him and they requested that he be admitted to [facility] so that he can be followed by the heart failure team.  The heart failure team was also consulted saw patient this AM.  The patient denies having chest pain but has been increasing productive white frothy sputum and SOB.    Patient underwent TEE/DCCV 05/07/2017. During the day on 11/27 he became dyspneic and lightheaded as well as hypotensive and was transferred to ICU and had  a Central Line placed and started on Pressors for Cardiogenic Shock. Started having increased PVC's so Amiodarone gtt was increased. Felt better yesterday but became more SOB overnight and symptomatic so had O2 increased.  Cardiology has discontinued Dobutamine. Primacor (Milrinone) has been started. Nephrology consulted. IV Amiodarone being continued and IV Heparin gtt transitioned to po Eliquis.   05/15/17 - Started on CRRT yesterday. Edema is improving. Patient now reports some improvement, though, minimal. Continue to monitor renal function and electrolytes.  Assessment & Plan:   Principal Problem:   Pneumonia Active Problems:   Hx of CABG   CAD (coronary artery disease)   HTN (hypertension)   Stage 4 chronic kidney disease (HCC)   HLD (hyperlipidemia)   Chronic combined systolic and diastolic CHF (congestive heart failure) (HCC)   H/O aortic valve replacement   ICD (implantable cardioverter-defibrillator) in place   Acute on chronic systolic heart failure (HCC)   Hypotension   Research study patient   Atrial fibrillation (HCC)   Elevated brain natriuretic peptide (BNP) level   Aspiration pneumonia (HCC)   Cardiogenic shock ([place])  Cardiogenic Shock -Transferred to ICU. Now on Milrinone. Dobutamine is discontinued. On Norepinephrine. Nephrology consulted due to worsening renal function and volume overload. CRRT started yesterday -Cardiology Managing Pressors and appreciate their help  Acute on Chronic combined systolic and Diastolic Heart Failure  -Pt continued to gain weight, with decreased urine output.   - Now on CRRT. -Recent  ECHO showed EF of 20-25% -Cardiology to did TEE/DCCV 11/27 -Hold Entresto given AKI and Hypotension; Cardiology also holding Coreg -Getting Galactic study drug as well -Strict I's/O's, Daily Weights  Community Acquired Pneumonia  - Procalcitonin is elevated at 1.68. Continue antibiotics. -blood culture and sputum cultures have been ordered. Blood  culture is non revealing. -Patient is already on IV ceftriaxone/azithromycin. Low threshold to DC Antibiotics. -Aspiration precautions ordered, elevate head of bed.        Atrial Flutter with RVR -This appears to be new Onset  -He has been on coreg and taking it and was started recently on corlanor. -Cardiology consulted and placed patient on Amiodarone gtt and Heparin gtt -CHADS2VASc of at least 3 -S/p TEE/DCCV 11/27 -BiV Pacing; May need Lead Revision in Future  -C/w Amiodarone gtt and Eliquis. -EP evaluated and they cannot tell what rhythm is today and plan to re-evaluate in AM; Device Rep to Interrogate Device to confirm Sinus Rhythm and evaluate LV/BiV pacing   Chronic Hypotension - Likely related to severe CHF.  -Hold Coreg and Entresto (patient renal function is also worsening) -Now on Pressor Support and being titrated.  AKI on Stage 3 CKD  -Cr worsened intially and ? Cardiorenal Syndrome;   -Renal consulted. -Wll need to renally dose medications and follow closely in the setting of ongoing diuresis.    CAD s/p CABG  -stable, no chest pain symptoms.    -Resume aspirin and atorvastatin.   Hypokalemia -Resolved.   Hyponatremia - Secondary to CHF. Prognostic significance in CHF.  Acute Normocytic Anemia - Likely multifactorial - Monitor.  Gout - -Resume home allopurinol daily.     DVT prophylaxis: Apixaban Code Status: FULL CODE Family Communication: No family present at bedside Disposition Plan: Remain Inpateint in ICU   Consultants:   Cardiology Heart Failure Team  Cardiology EP  Nephrology  Procedures:   Tivoli placed 11/27   Antimicrobials:  Anti-infectives (From admission, onward)   Start     Dose/Rate Route Frequency Ordered Stop   04/16/2017 1400  cefTRIAXone (ROCEPHIN) 1 g in dextrose 5 % 50 mL IVPB     1 g 100 mL/hr over 30 Minutes Intravenous Every 24 hours 05/09/17 1742     04/26/2017 1400  azithromycin (ZITHROMAX) 500 mg  in dextrose 5 % 250 mL IVPB     500 mg 250 mL/hr over 60 Minutes Intravenous Every 24 hours 05/09/17 1742 05/14/17 1539   05/09/17 1315  cefTRIAXone (ROCEPHIN) 1 g in dextrose 5 % 50 mL IVPB     1 g 100 mL/hr over 30 Minutes Intravenous  Once 05/09/17 1312 05/09/17 1358   05/09/17 1315  azithromycin (ZITHROMAX) 500 mg in dextrose 5 % 250 mL IVPB     500 mg 250 mL/hr over 60 Minutes Intravenous  Once 05/09/17 1312 05/09/17 1505     Subjective: Seen alongside patient's Nurse. Continues to report worsening edema. Also reports early satiety. Nephrology consulted for possible CRRT.  Objective: Vitals:   05/15/17 0826 05/15/17 0900 05/15/17 1000 05/15/17 1100  BP:  104/70 107/87 (!) 64/55  Pulse:  (!) 109 (!) 110 (!) 109  Resp:  (!) 22 (!) 24 (!) 23  Temp: 98.3 F (36.8 C)     TempSrc: Axillary     SpO2:  96% 93% 98%  Weight:      Height:        Intake/Output Summary (Last 24 hours) at 05/15/2017 1153 Last data filed at 05/15/2017 1100 Gross per  24 hour  Intake 2069.14 ml  Output 3242 ml  Net -1172.86 ml   Filed Weights   05/13/17 0700 05/14/17 0700 05/15/17 0700  Weight: 91.4 kg (201 lb 8 oz) 93 kg (205 lb 0.4 oz) 98 kg (216 lb 0.8 oz)   Examination: Physical Exam:  Constitutional: WN/WD Caucasian male in NAD. Sitting in chair bedside  Eyes: Sclerae anicteric. Lids Normal ENMT: External Ears and nose appear normal Neck: Supple with JVP Respiratory: Diminished with some diffuse crackles. No appreciable wheezing or rhonchi Cardiovascular: RRR; S1, S2, and +S3. Has 2+ to 3 LE edema Abdomen: Soft, NT, ND. Bowel sounds present GU: Deferred Musculoskeletal: No contractures. No cyanosis Skin: Warm and Dry. No appreciable rashes or lesions on a limited skin eval Neurologic: CN 2-12 grossly intact. No appreciable focal deficits Psychiatric: Normal mood and affect. Intact judgement and insight  Data Reviewed: I have personally reviewed following labs and imaging  studies  CBC: Recent Labs  Lab 05/09/17 1121  05/12/17 0410 05/13/17 0444 05/13/17 1536 05/14/17 0530 05/15/17 0459  WBC 8.5   < > 10.0 12.3* 10.3 9.9 10.1  NEUTROABS 6.8  --   --   --   --  8.2*  --   HGB 13.4   < > 11.6* 8.3* 12.2* 11.5* 11.4*  HCT 43.9   < > 35.3* 25.1* 35.7* 35.6* 34.7*  MCV 104.8*   < > 98.9 100.0 98.6 97.0 97.7  PLT 155   < > 114* 137* 104* 110* 123*   < > = values in this interval not displayed.   Basic Metabolic Panel: Recent Labs  Lab 05/11/17 1856 05/12/17 0410 05/13/17 0444 05/14/17 0530 05/14/17 1646 05/15/17 0459  NA 133* 133* 131* 128* 124* 130*  K 3.6 3.8 4.3 4.7 4.2 4.5  CL 100* 101 98* 95* 91* 96*  CO2 23 25 24 23 22 24   GLUCOSE 140* 146* 132* 134* 137* 112*  BUN 78* 73* 69* 71* 76* 56*  CREATININE 2.53* 2.36* 2.43* 2.89* 3.27* 2.36*  CALCIUM 8.8* 8.8* 8.6* 8.8* 8.6* 8.5*  MG 2.5* 2.2 2.2 2.3  --  2.5*  PHOS  --  3.9 3.6 4.1 4.5 3.3   GFR: Estimated Creatinine Clearance: 33.4 mL/min (A) (by C-G formula based on SCr of 2.36 mg/dL (H)). Liver Function Tests: Recent Labs  Lab 04/20/2017 0827 05/11/17 0357 05/12/17 0410 05/13/17 0444 05/14/17 0530 05/14/17 1646 05/15/17 0459  AST 37 28 22 19  33  --   --   ALT 52 44 36 28 37  --   --   ALKPHOS 68 64 58 62 167*  --   --   BILITOT 0.7 0.7 0.6 0.8 0.6  --   --   PROT 6.3* 5.8* 6.2* 5.9* 6.1*  --   --   ALBUMIN 3.6 3.3* 3.5 3.3* 3.0* 3.2* 3.0*   No results for input(s): LIPASE, AMYLASE in the last 168 hours. No results for input(s): AMMONIA in the last 168 hours. Coagulation Profile: No results for input(s): INR, PROTIME in the last 168 hours. Cardiac Enzymes: Recent Labs  Lab 05/09/17 1121 05/09/17 1607  TROPONINI 0.07* 0.07*   BNP (last 3 results) No results for input(s): PROBNP in the last 8760 hours. HbA1C: No results for input(s): HGBA1C in the last 72 hours. CBG: No results for input(s): GLUCAP in the last 168 hours. Lipid Profile: No results for input(s): CHOL,  HDL, LDLCALC, TRIG, CHOLHDL, LDLDIRECT in the last 72 hours. Thyroid Function Tests: No results  for input(s): TSH, T4TOTAL, FREET4, T3FREE, THYROIDAB in the last 72 hours. Anemia Panel: No results for input(s): VITAMINB12, FOLATE, FERRITIN, TIBC, IRON, RETICCTPCT in the last 72 hours. Sepsis Labs: Recent Labs  Lab 05/14/17 1134  PROCALCITON 1.68    Recent Results (from the past 240 hour(s))  Respiratory Panel by PCR     Status: None   Collection Time: 05/09/17  3:49 PM  Result Value Ref Range Status   Adenovirus NOT DETECTED NOT DETECTED Final   Coronavirus 229E NOT DETECTED NOT DETECTED Final   Coronavirus HKU1 NOT DETECTED NOT DETECTED Final   Coronavirus NL63 NOT DETECTED NOT DETECTED Final   Coronavirus OC43 NOT DETECTED NOT DETECTED Final   Metapneumovirus NOT DETECTED NOT DETECTED Final   Rhinovirus / Enterovirus NOT DETECTED NOT DETECTED Final   Influenza A NOT DETECTED NOT DETECTED Final   Influenza B NOT DETECTED NOT DETECTED Final   Parainfluenza Virus 1 NOT DETECTED NOT DETECTED Final   Parainfluenza Virus 2 NOT DETECTED NOT DETECTED Final   Parainfluenza Virus 3 NOT DETECTED NOT DETECTED Final   Parainfluenza Virus 4 NOT DETECTED NOT DETECTED Final   Respiratory Syncytial Virus NOT DETECTED NOT DETECTED Final   Bordetella pertussis NOT DETECTED NOT DETECTED Final   Chlamydophila pneumoniae NOT DETECTED NOT DETECTED Final   Mycoplasma pneumoniae NOT DETECTED NOT DETECTED Final    Comment: Performed at [facility], [place] [address], [place], [place] [address]  Culture, blood (routine x 2) Call MD if unable to obtain prior to antibiotics being given     Status: None   Collection Time: 05/09/17  4:14 PM  Result Value Ref Range Status   Specimen Description LEFT ANTECUBITAL  Final   Special Requests   Final    BOTTLES DRAWN AEROBIC AND ANAEROBIC Blood Culture adequate volume   Culture NO GROWTH 5 DAYS  Final   Report Status 05/14/2017 FINAL  Final  Culture, blood  (routine x 2) Call MD if unable to obtain prior to antibiotics being given     Status: None   Collection Time: 05/09/17  4:19 PM  Result Value Ref Range Status   Specimen Description BLOOD LEFT ARM  Final   Special Requests   Final    BOTTLES DRAWN AEROBIC AND ANAEROBIC Blood Culture adequate volume   Culture NO GROWTH 5 DAYS  Final   Report Status 05/14/2017 FINAL  Final  MRSA PCR Screening     Status: None   Collection Time: 05/13/2017  6:50 PM  Result Value Ref Range Status   MRSA by PCR NEGATIVE NEGATIVE Final    Comment:        The GeneXpert MRSA Assay (FDA approved for NASAL specimens only), is one component of a comprehensive MRSA colonization surveillance program. It is not intended to diagnose MRSA infection nor to guide or monitor treatment for MRSA infections.   Culture, Urine     Status: None   Collection Time: 05/13/17  3:36 PM  Result Value Ref Range Status   Specimen Description URINE, CLEAN CATCH  Final   Special Requests NONE  Final   Culture NO GROWTH  Final   Report Status 05/14/2017 FINAL  Final    Radiology Studies: Dg Chest Port 1 View  Result Date: 05/14/2017 CLINICAL DATA:  Evaluate central venous line EXAM: PORTABLE CHEST 1 VIEW COMPARISON:  May 13, 2017 FINDINGS: A right subclavian line terminates in the central SVC. A right IJ terminates near the caval atrial junction. No pneumothorax.  Cardiomegaly. Stable AICD device. Opacity in the left upper lobe is slightly more prominent. Suggested mild edema. Effusion and underlying opacity in the right base has improved. IMPRESSION: 1. The new right subclavian line is in good position with no pneumothorax. 2. Mildly worsened opacity in the left base. Improving opacity and effusion in the right base. Mild edema. Electronically Signed   By: Dorise Bullion III M.D   On: 05/14/2017 18:05   Scheduled Meds: . allopurinol  100 mg Oral Daily  . aspirin EC  81 mg Oral Daily  . atorvastatin  40 mg Oral q1800  .  Chlorhexidine Gluconate Cloth  6 each Topical Daily  . GALACTIC Study - Placebo / omecamtiv mecarbil (PI-[place])  1 tablet Oral BID  . guaiFENesin  600 mg Oral BID  . mouth rinse  15 mL Mouth Rinse BID  . multivitamin with minerals  1 tablet Oral Daily  . sodium chloride flush  10-40 mL Intracatheter Q12H  . sodium chloride flush  3 mL Intravenous Q12H   Continuous Infusions: . sodium chloride    . sodium chloride    . amiodarone 30 mg/hr (05/15/17 0700)  . cefTRIAXone (ROCEPHIN)  IV Stopped (05/14/17 1539)  . heparin 1,400 Units/hr (05/15/17 0700)  . milrinone 0.25 mcg/kg/min (05/15/17 0823)  . norepinephrine (LEVOPHED) Adult infusion 8.5 mcg/min (05/15/17 1103)  . dialysis replacement fluid (prismasate)    . dialysis replacement fluid (prismasate) 400 mL/hr at 05/15/17 0726  . dialysate (PRISMASATE) 1,500 mL/hr at 05/15/17 1151  . sodium chloride      LOS: 6 days   Bonnell Public, MD [facility] Pager [phone].  If 7PM-7AM, please contact night-coverage [email or web address] Password [facility] 05/15/2017, 11:53 AM

## 2017-05-15 NOTE — Progress Notes (Signed)
Patient ID: Dillon Cisneros, male   DOB: Mar 30, 1951, 66 y.o.   MRN: [id number]     Advanced Heart Failure Rounding Note  PCP: Meda Coffee Cardiologist: Aundra Dubin  Subjective:   11/27 S/P TEE-guided DCCV from flutter to NSR with improved BiV pacing.   11/27developed acute dyspnea and dizziness. Frequent PVCs. He was hypotensive. Amio increased to 60 mg per hour, norepi added, and dobutamine 2.5 mcg started with low co-ox.   He continued to diurese poorly despite inotrope support with rise in creatinine, CVP > 20. We attempted transition from dobutamine to milrinone but this did not affect UOP significantly.  Renal consulted 12/1 and CVVH started.  Lasix gtt and apixaban stopped and heparin gtt started.   This morning, he is undergoing CVVH with UF rate 130 ml/hr.  Co-ox 83%, CVP 16 (lower).  He continues to have UOP.   Currently on milrinone 0.25, amiodarone 30 mg/hr, norepinephrine 10.   He remains on abx, PCT elevated at 1.68.    He is still short of breath with cough.   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   Objective:   Weight Range: 216 lb 0.8 oz (98 kg) Body mass index is 33.84 kg/m.   Vital Signs:   Temp:  [97.6 F (36.4 C)-99.1 F (37.3 C)] 99.1 F (37.3 C) (12/02 0330) Pulse Rate:  [41-111] 104 (12/02 0700) Resp:  [11-25] 14 (12/02 0700) BP: (79-106)/(49-81) 95/76 (12/02 0700) SpO2:  [87 %-99 %] 97 % (12/02 0700) FiO2 (%):  [50 %] 50 % (12/02 0530) Weight:  [216 lb 0.8 oz (98 kg)] 216 lb 0.8 oz (98 kg) (12/02 0700) Last BM Date: 05/14/17  Weight change: Filed Weights   05/13/17 0700 05/14/17 0700 05/15/17 0700  Weight: 201 lb 8 oz (91.4 kg) 205 lb 0.4 oz (93 kg) 216 lb 0.8 oz (98 kg)    Intake/Output:   Intake/Output Summary (Last 24 hours) at 05/15/2017 0735 Last data filed at 05/15/2017 0700 Gross per 24 hour    Intake 2613.68 ml  Output 2214 ml  Net 399.68 ml      Physical Exam  CVP 16 General: NAD Neck: JVP 14+ cm, no thyromegaly or thyroid nodule.  Lungs: Decreased breath sounds at bases.  CV: Lateral PMI.  Heart regular S1/S2, no S3/S4, 1/6 SEM RUSB.  1+ edema to knees. Abdomen: Soft, nontender, no hepatosplenomegaly, no distention.  Skin: Intact without lesions or rashes.  Neurologic: Alert and oriented x 3.  Psych: Normal affect. Extremities: No clubbing or cyanosis.  HEENT: Normal.    Telemetry   NSR, BiV paced with PVCs (personally reviewed)   Labs    CBC Recent Labs    05/14/17 0530 05/15/17 0459  WBC 9.9 10.1  NEUTROABS 8.2*  --   HGB 11.5* 11.4*  HCT 35.6* 34.7*  MCV 97.0 97.7  PLT 110* 673*   Basic Metabolic Panel Recent Labs    05/14/17 0530 05/14/17 1646 05/15/17 0459  NA 128* 124* 130*  K 4.7 4.2 4.5  CL 95* 91* 96*  CO2 23 22 24   GLUCOSE 134* 137* 112*  BUN 71* 76* 56*  CREATININE 2.89* 3.27* 2.36*  CALCIUM 8.8* 8.6* 8.5*  MG 2.3  --  2.5*  PHOS 4.1 4.5 3.3   Liver Function Tests Recent Labs    05/13/17 0444 05/14/17 0530 05/14/17 1646 05/15/17 0459  AST 19 33  --   --  ALT 28 37  --   --   ALKPHOS 62 167*  --   --   BILITOT 0.8 0.6  --   --   PROT 5.9* 6.1*  --   --   ALBUMIN 3.3* 3.0* 3.2* 3.0*   No results for input(s): LIPASE, AMYLASE in the last 72 hours. Cardiac Enzymes No results for input(s): CKTOTAL, CKMB, CKMBINDEX, TROPONINI in the last 72 hours.  BNP: BNP (last 3 results) Recent Labs    05/11/17 0357 05/12/17 0410 05/13/17 0444  BNP 1,412.5* 1,720.5* 2,177.6*    ProBNP (last 3 results) No results for input(s): PROBNP in the last 8760 hours.   D-Dimer No results for input(s): DDIMER in the last 72 hours. Hemoglobin A1C No results for input(s): HGBA1C in the last 72 hours. Fasting Lipid Panel No results for input(s): CHOL, HDL, LDLCALC, TRIG, CHOLHDL, LDLDIRECT in the last 72 hours. Thyroid Function  Tests No results for input(s): TSH, T4TOTAL, T3FREE, THYROIDAB in the last 72 hours.  Invalid input(s): FREET3  Other results:   Imaging    Dg Chest Port 1 View  Result Date: 05/14/2017 CLINICAL DATA:  Evaluate central venous line EXAM: PORTABLE CHEST 1 VIEW COMPARISON:  May 13, 2017 FINDINGS: A right subclavian line terminates in the central SVC. A right IJ terminates near the caval atrial junction. No pneumothorax. Cardiomegaly. Stable AICD device. Opacity in the left upper lobe is slightly more prominent. Suggested mild edema. Effusion and underlying opacity in the right base has improved. IMPRESSION: 1. The new right subclavian line is in good position with no pneumothorax. 2. Mildly worsened opacity in the left base. Improving opacity and effusion in the right base. Mild edema. Electronically Signed   By: Dorise Bullion III M.D   On: 05/14/2017 18:05     Medications:     Scheduled Medications: . allopurinol  100 mg Oral Daily  . aspirin EC  81 mg Oral Daily  . atorvastatin  40 mg Oral q1800  . Chlorhexidine Gluconate Cloth  6 each Topical Daily  . GALACTIC Study - Placebo / omecamtiv mecarbil (PI-[facility]zad Tibbetts)  1 tablet Oral BID  . guaiFENesin  600 mg Oral BID  . mouth rinse  15 mL Mouth Rinse BID  . multivitamin with minerals  1 tablet Oral Daily  . sodium chloride flush  10-40 mL Intracatheter Q12H  . sodium chloride flush  3 mL Intravenous Q12H  . sodium chloride flush  3 mL Intravenous Q12H    Infusions: . sodium chloride    . sodium chloride    . sodium chloride    . amiodarone 30 mg/hr (05/15/17 0700)  . cefTRIAXone (ROCEPHIN)  IV Stopped (05/14/17 1539)  . heparin 1,400 Units/hr (05/15/17 0700)  . milrinone 0.25 mcg/kg/min (05/15/17 0700)  . norepinephrine (LEVOPHED) Adult infusion 10 mcg/min (05/15/17 0700)  . dialysis replacement fluid (prismasate)    . dialysis replacement fluid (prismasate) 400 mL/hr at 05/15/17 0726  . dialysate (PRISMASATE) 1,500 mL/hr  at 05/15/17 0500  . sodium chloride      PRN Medications: sodium chloride, Place/Maintain arterial line **AND** sodium chloride, acetaminophen, alteplase, heparin, nitroGLYCERIN, ondansetron (ZOFRAN) IV, sodium chloride, sodium chloride flush, sodium chloride flush, sodium chloride flush    Patient Profile   66 yo with history of CAD, aortic and mitral valve disease, ischemic cardiomyopathy, and CKD stage 3 was admitted with acute on chronic systolic CHF, ?PNA, AKI on CKD, and atrial flutter.   Assessment/Plan   1. Cardiogenic Shock: Started on  norepi and dobutamine => transitioned to milrinone with ongoing poor UOP. SBP stable in 90s-100s.  CO-OX 82%.  2. Acute on chronic systolic CHF: Ischemic cardiomyopathy.  EF 20-25% on 6/18 echo.  St Jude CRT-D device.  CVP 21.  He is markedly volume overloaded did not effectively diurese despite Lasix 20 mg/hr, acetazolamide, and metolazone 5 mg bid. We tried transitioning dobutamine to milrinone without effect.  Creatinine began to rise. Renal was consulted and CVVH started.  Co-ox remains good at 82% and we are now pulling fluid via UF at 130 cc/hr, CVP down to 16.  Long-term prognosis now very concerning.  With renal dysfunction, he is not a good LVAD candidate.  Once he is fully diuresed, will need to try to get him off CVVH as he would not be a good long-term HD candidate with severe cardiomyopathy.  - Continue current norepinephrine and milrinone.   - Increase UF to 150 cc/hr.   - He is off Entresto, spironolactone, and Coreg.   - Will discuss with research team regarding Galactic study drug.  - At baseline, percentage of BiV pacing is below goal.  Likely due to mode switching with baseline sinus tachy and faulty atrial lead.  Will need atrial lead re-worked after he recovers from current episode. EP following.  3. CAD: S/p CABG.  No chest pain.  TnI mildly elevated with no trend, likely due to demand ischemia/volume overload. No chest pain.  -  Continue ASA 81.  - Continue atorvastatin 40 daily.  4. Bioprosthetic aortic valve: Stable on last echo.  5. S/p mitral valve repair: Stable on last echo.  6. AKI on CKD stage 3.  Now on CVVH.    7. Atrial flutter: New for him, with RVR.  Has triggered CHF and likely AKI.  S/P TEE-guided DC-CV.  Today in NSR with BiV pacing with PVCs.  - Continue amio drip at 30 mg/hr.  - Eliquis stopped and now on heparin gtt with CVVH.  8. ?PNA: Bibasilar airspace disease.  He is on azithromycin/ceftriaxone empirically. PCT elevated yesterday at 1.68.  He is afebrile with normal WBCs.  Cultures negative so far, has not been able to cough anything up for sputum culture.  - Can stop azithromycin.  Continue ceftriaxone.  9. Frequent PVCs/NSVT: Continue amio drip.   Length of Stay: 6  CRITICAL CARE Performed by: Loralie Champagne  Total critical care time: 35 minutes  Critical care time was exclusive of separately billable procedures and treating other patients.  Critical care was necessary to treat or prevent imminent or life-threatening deterioration.  Critical care was time spent personally by me on the following activities: development of treatment plan with patient and/or surrogate as well as nursing, discussions with consultants, evaluation of patient's response to treatment, examination of patient, obtaining history from patient or surrogate, ordering and performing treatments and interventions, ordering and review of laboratory studies, ordering and review of radiographic studies, pulse oximetry and re-evaluation of patient's condition.  Loralie Champagne 05/15/2017 7:35 AM

## 2017-05-15 NOTE — Progress Notes (Addendum)
PT Cancellation Note  Patient Details Name: Dillon Cisneros MRN: [id number] DOB: 11/08/1950   Cancelled Treatment:    Reason Eval/Treat Not Completed: Fatigue/lethargy limiting ability to participate;Patient declined, no reason specified. Patient remains on CRRT.  Reports he is too fatigued to get up and politely declines PT.  Will return tomorrow for PT evaluation.   Despina Pole 05/15/2017, 4:52 PM Carita Pian. Sanjuana Kava, [place] Pager [phone]

## 2017-05-15 NOTE — Progress Notes (Signed)
ANTICOAGULATION CONSULT NOTE - Follow-Up Consult  Pharmacy Consult for heparin > apixaban > heparin Indication: atrial fibrillation  Allergies  Allergen Reactions  . Penicillins Hives    Has patient had a PCN reaction causing immediate rash, facial/tongue/throat swelling, SOB or lightheadedness with hypotension: No Has patient had a PCN reaction causing severe rash involving mucus membranes or skin necrosis: No Has patient had a PCN reaction that required hospitalization: No Has patient had a PCN reaction occurring within the last 10 years: No If all of the above answers are "NO", then may proceed with Cephalosporin use.   . Nsaids     Should avoid    Patient Measurements: Height: 5\' 7"  (170.2 cm) Weight: 216 lb 0.8 oz (98 kg) IBW/kg (Calculated) : 66.1 Heparin Dosing Weight: 84.5kg  Vital Signs: Temp: 98.5 F (36.9 C) (12/02 1225) Temp Source: Oral (12/02 1225) BP: 89/63 (12/02 1400) Pulse Rate: 43 (12/02 1400)  Labs: Recent Labs    05/13/17 1536 05/14/17 0530 05/14/17 1646 05/15/17 0459 05/15/17 1407  HGB 12.2* 11.5*  --  11.4*  --   HCT 35.7* 35.6*  --  34.7*  --   PLT 104* 110*  --  123*  --   APTT  --   --   --  64* 66*  HEPARINUNFRC  --   --   --  >2.20*  --   CREATININE  --  2.89* 3.27* 2.36* 2.17*    Estimated Creatinine Clearance: 36.4 mL/min (A) (by C-G formula based on SCr of 2.17 mg/dL (H)).   Medical History: Past Medical History:  Diagnosis Date  . Anemia   . Arthritis    arthritis  . Atrial fibrillation ([place]) 12/28/2015  . Basal cell carcinoma 2011   scalp/neck/forehead  . CAD (coronary artery disease) 06/21/1999  . 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 CHF (congestive heart failure) ([place]) 06/21/1999  . Chronic kidney disease 2010   stage 4 kidney failure  . Gout   . 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 Hyperlipidemia   . Hypertension 1998   . Thrombocytopenia ([place]) 2017  . Ventricular arrhythmia 2001    Assessment: 46 YOM transferred from [facility] with A-flutter. Underwent successful TEE-DCCV.  Patient was not on anticoagulation PTA.   Patient was previously on heparin drip for afib and transitioned to apixaban post DCCV.  Since then has developed HF exacerbation with volume overload - not responding to diuretics> plan to start CRT.  Last dose apixaban 12/1 am plan for HD cath placement late today and start heparin drip for Afib after line placed.  Will use aptt for heparin dosing given apixaban will alter the HL.    Heparin drip 1400 uts/hr aPTT 66 sec at bottem of goal range will increase slightly No signs/symptoms of bleeding.   Goal of Therapy:  APTT goal 66-102 sec Heparin level 0.3-0.7 units/ml Monitor platelets by anticoagulation protocol: Yes   Plan:  Increase Heparin drip 1450 uts/hr Daily HL, APTT, CBC  Bonnita Nasuti Pharm.D. CPP, BCPS Clinical Pharmacist [phone] 05/15/2017 3:02 PM

## 2017-05-15 NOTE — Progress Notes (Signed)
[facility] Progress Note  Subjective: I/O were even yest  Vitals:   05/15/17 0800 05/15/17 0826 05/15/17 0900 05/15/17 1000  BP: 105/73  104/70 107/87  Pulse: (!) 106  (!) 109 (!) 110  Resp: (!) 26  (!) 22 (!) 24  Temp:  98.3 F (36.8 C)    TempSrc:  Axillary    SpO2: 95%  96% 93%  Weight:      Height:        Inpatient medications: . allopurinol  100 mg Oral Daily  . aspirin EC  81 mg Oral Daily  . atorvastatin  40 mg Oral q1800  . Chlorhexidine Gluconate Cloth  6 each Topical Daily  . GALACTIC Study - Placebo / omecamtiv mecarbil (PI-[place])  1 tablet Oral BID  . guaiFENesin  600 mg Oral BID  . mouth rinse  15 mL Mouth Rinse BID  . multivitamin with minerals  1 tablet Oral Daily  . sodium chloride flush  10-40 mL Intracatheter Q12H  . sodium chloride flush  3 mL Intravenous Q12H   . sodium chloride    . sodium chloride    . amiodarone 30 mg/hr (05/15/17 0700)  . cefTRIAXone (ROCEPHIN)  IV Stopped (05/14/17 1539)  . heparin 1,400 Units/hr (05/15/17 0700)  . milrinone 0.25 mcg/kg/min (05/15/17 0823)  . norepinephrine (LEVOPHED) Adult infusion 9 mcg/min (05/15/17 1022)  . dialysis replacement fluid (prismasate)    . dialysis replacement fluid (prismasate) 400 mL/hr at 05/15/17 0726  . dialysate (PRISMASATE) 1,500 mL/hr at 05/15/17 0823  . sodium chloride     Place/Maintain arterial line **AND** sodium chloride, acetaminophen, alteplase, heparin, nitroGLYCERIN, ondansetron (ZOFRAN) IV, sodium chloride, sodium chloride flush, sodium chloride flush  Exam: Gen alert, in bed on CRRT No jvd or bruits Chest bibasilar rales 1/3 up RRR 2/6 holosyst , no gallop Abd soft ntnd no mass or ascites +bs MS no joint effusions or deformity Ext 2+ bilat LE edema / no wounds or ulcers Neuro is alert, Ox 3 , nf  UA - negative 11/30 CXR - sig CHF 11/20   Impression: 1. Acute on CRF - due to cardiorenal syndrome/ decomp CHF. CRRT D#2 2. Decomp CHF/ ICM / EF 25%:  CVP down 6- 14 3. CKD 3b - baseline creat 1.8 4. CAD hx CABG/ CRT-D 5. Hx bio AoVR/ MV repair 6. Hypotension - cardiogenic shock on levo and milrinone gtts 7. Afib on amio gtt  Plan - cont CRRT, Donnamarie Rossetti as Ronnie Derby MD [facility] pager [phone]   05/15/2017, 10:51 AM   Recent Labs  Lab 05/14/17 0530 05/14/17 1646 05/15/17 0459  NA 128* 124* 130*  K 4.7 4.2 4.5  CL 95* 91* 96*  CO2 23 22 24   GLUCOSE 134* 137* 112*  BUN 71* 76* 56*  CREATININE 2.89* 3.27* 2.36*  CALCIUM 8.8* 8.6* 8.5*  PHOS 4.1 4.5 3.3   Recent Labs  Lab 05/12/17 0410 05/13/17 0444 05/14/17 0530 05/14/17 1646 05/15/17 0459  AST 22 19 33  --   --   ALT 36 28 37  --   --   ALKPHOS 58 62 167*  --   --   BILITOT 0.6 0.8 0.6  --   --   PROT 6.2* 5.9* 6.1*  --   --   ALBUMIN 3.5 3.3* 3.0* 3.2* 3.0*   Recent Labs  Lab 05/09/17 1121  05/13/17 1536 05/14/17 0530 05/15/17 0459  WBC 8.5   < > 10.3 9.9 10.1  NEUTROABS 6.8  --   --  8.2*  --   HGB 13.4   < > 12.2* 11.5* 11.4*  HCT 43.9   < > 35.7* 35.6* 34.7*  MCV 104.8*   < > 98.6 97.0 97.7  PLT 155   < > 104* 110* 123*   < > = values in this interval not displayed.   Iron/TIBC/Ferritin/ %Sat No results found for: IRON, TIBC, FERRITIN, IRONPCTSAT

## 2017-05-16 ENCOUNTER — Inpatient Hospital Stay (HOSPITAL_COMMUNITY): Payer: Medicare Other

## 2017-05-16 DIAGNOSIS — T80219A Unspecified infection due to central venous catheter, initial encounter: Secondary | ICD-10-CM

## 2017-05-16 LAB — CBC
HCT: 34 % — ABNORMAL LOW (ref 39.0–52.0)
Hemoglobin: 11.3 g/dL — ABNORMAL LOW (ref 13.0–17.0)
MCH: 32.6 pg (ref 26.0–34.0)
MCHC: 33.2 g/dL (ref 30.0–36.0)
MCV: 98 fL (ref 78.0–100.0)
PLATELETS: 116 10*3/uL — AB (ref 150–400)
RBC: 3.47 MIL/uL — ABNORMAL LOW (ref 4.22–5.81)
RDW: 16.6 % — AB (ref 11.5–15.5)
WBC: 12.2 10*3/uL — AB (ref 4.0–10.5)

## 2017-05-16 LAB — GLUCOSE, CAPILLARY
GLUCOSE-CAPILLARY: 139 mg/dL — AB (ref 65–99)
Glucose-Capillary: 122 mg/dL — ABNORMAL HIGH (ref 65–99)
Glucose-Capillary: 126 mg/dL — ABNORMAL HIGH (ref 65–99)

## 2017-05-16 LAB — RENAL FUNCTION PANEL
Albumin: 2.7 g/dL — ABNORMAL LOW (ref 3.5–5.0)
Albumin: 3.1 g/dL — ABNORMAL LOW (ref 3.5–5.0)
Anion gap: 7 (ref 5–15)
Anion gap: 8 (ref 5–15)
BUN: 34 mg/dL — AB (ref 6–20)
BUN: 33 mg/dL — ABNORMAL HIGH (ref 6–20)
CHLORIDE: 100 mmol/L — AB (ref 101–111)
CHLORIDE: 98 mmol/L — AB (ref 101–111)
CO2: 26 mmol/L (ref 22–32)
CO2: 26 mmol/L (ref 22–32)
CREATININE: 2.12 mg/dL — AB (ref 0.61–1.24)
Calcium: 8.2 mg/dL — ABNORMAL LOW (ref 8.9–10.3)
Calcium: 8.3 mg/dL — ABNORMAL LOW (ref 8.9–10.3)
Creatinine, Ser: 2.01 mg/dL — ABNORMAL HIGH (ref 0.61–1.24)
GFR calc Af Amer: 38 mL/min — ABNORMAL LOW (ref >60)
GFR calc non Af Amer: 32 mL/min — ABNORMAL LOW (ref >60)
GFR, EST AFRICAN AMERICAN: 35 mL/min — AB (ref >60)
GFR, EST NON AFRICAN AMERICAN: 30 mL/min — AB (ref >60)
GLUCOSE: 167 mg/dL — AB (ref 65–99)
Glucose, Bld: 131 mg/dL — ABNORMAL HIGH (ref 65–99)
PHOSPHORUS: 2.6 mg/dL (ref 2.5–4.6)
POTASSIUM: 4.5 mmol/L (ref 3.5–5.1)
POTASSIUM: 4.4 mmol/L (ref 3.5–5.1)
Phosphorus: 3.1 mg/dL (ref 2.5–4.6)
Sodium: 133 mmol/L — ABNORMAL LOW (ref 135–145)
Sodium: 132 mmol/L — ABNORMAL LOW (ref 135–145)

## 2017-05-16 LAB — COOXEMETRY PANEL
CARBOXYHEMOGLOBIN: 0.9 % (ref 0.5–1.5)
Methemoglobin: 1.2 % (ref 0.0–1.5)
O2 SAT: 77.2 %
Total hemoglobin: 11.5 g/dL — ABNORMAL LOW (ref 12.0–16.0)

## 2017-05-16 LAB — URINALYSIS, ROUTINE W REFLEX MICROSCOPIC
GLUCOSE, UA: NEGATIVE mg/dL
Hgb urine dipstick: NEGATIVE
KETONES UR: 5 mg/dL — AB
Leukocytes, UA: NEGATIVE
NITRITE: NEGATIVE
PH: 5 (ref 5.0–8.0)
Protein, ur: 30 mg/dL — AB
SPECIFIC GRAVITY, URINE: 1.029 (ref 1.005–1.030)

## 2017-05-16 LAB — APTT
aPTT: 56 seconds — ABNORMAL HIGH (ref 24–36)
aPTT: 82 seconds — ABNORMAL HIGH (ref 24–36)

## 2017-05-16 LAB — MAGNESIUM: MAGNESIUM: 2.4 mg/dL (ref 1.7–2.4)

## 2017-05-16 LAB — SEDIMENTATION RATE: Sed Rate: 51 mm/hr — ABNORMAL HIGH (ref 0–16)

## 2017-05-16 LAB — HEPARIN LEVEL (UNFRACTIONATED): Heparin Unfractionated: 1.9 IU/mL — ABNORMAL HIGH (ref 0.30–0.70)

## 2017-05-16 LAB — PROCALCITONIN: Procalcitonin: 2.84 ng/mL

## 2017-05-16 MED ORDER — DEXTROSE 5 % IV SOLN
2.0000 g | Freq: Two times a day (BID) | INTRAVENOUS | Status: DC
  Administered 2017-05-16 – 2017-05-18 (×5): 2 g via INTRAVENOUS
  Filled 2017-05-16 (×5): qty 2

## 2017-05-16 MED ORDER — DEXTROSE 5 % IV SOLN
0.0000 ug/min | INTRAVENOUS | Status: DC
  Administered 2017-05-16: 25 ug/min via INTRAVENOUS
  Administered 2017-05-16: 14 ug/min via INTRAVENOUS
  Administered 2017-05-17: 19 ug/min via INTRAVENOUS
  Administered 2017-05-18: 10 ug/min via INTRAVENOUS
  Administered 2017-05-18: 7.5 ug/min via INTRAVENOUS
  Administered 2017-05-19 – 2017-05-20 (×2): 60 ug/min via INTRAVENOUS
  Filled 2017-05-16 (×9): qty 16

## 2017-05-16 MED ORDER — CEFTAZIDIME 1 G IJ SOLR
1.0000 g | Freq: Two times a day (BID) | INTRAMUSCULAR | Status: DC
  Filled 2017-05-16: qty 1

## 2017-05-16 MED ORDER — DOBUTAMINE IN D5W 4-5 MG/ML-% IV SOLN
7.5000 ug/kg/min | INTRAVENOUS | Status: DC
  Administered 2017-05-16 – 2017-05-18 (×2): 2.5 ug/kg/min via INTRAVENOUS
  Administered 2017-05-20: 7.5 ug/kg/min via INTRAVENOUS
  Filled 2017-05-16 (×3): qty 250

## 2017-05-16 MED ORDER — INSULIN ASPART 100 UNIT/ML ~~LOC~~ SOLN
0.0000 [IU] | Freq: Three times a day (TID) | SUBCUTANEOUS | Status: DC
  Administered 2017-05-16 – 2017-05-17 (×4): 1 [IU] via SUBCUTANEOUS

## 2017-05-16 NOTE — Progress Notes (Signed)
PT Cancellation Note  Patient Details Name: Dillon Cisneros MRN: [id number] DOB: Feb 05, 1951   Cancelled Treatment:    Reason Eval/Treat Not Completed: Remains on CVVHD this am  Duncan Dull 05/16/2017, 9:51 AM

## 2017-05-16 NOTE — Progress Notes (Addendum)
Pharmacy Antibiotic Note  Dillon Cisneros is a 66 y.o. male admitted on 05/09/2017 with pneumonia.  Pharmacy has been consulted for ceftazidime dosing.  Patient was being treated with azithromycin (stopped 12/1) and ceftriaxone (last dose 12/2). WBC up to 12.2 today. Renal function improving slightly today (Scr 2.01) on CRRT. Became hypotensive overnight with Tmax of 100. Nursing noting pink tinged sputum. New blood, urine, and sputum cultures ordered for collection today.  Plan: Discontinue ceftriaxone Ceftazidime 2 gram every 12 hours.  Monitor clinical picture, cx results, and renal function  Height: 5\' 7"  (170.2 cm) Weight: 213 lb 3 oz (96.7 kg) IBW/kg (Calculated) : 66.1  Temp (24hrs), Avg:98.5 F (36.9 C), Min:97.6 F (36.4 C), Max:100 F (37.8 C)  Recent Labs  Lab 05/13/17 0444 05/13/17 1536 05/14/17 0530 05/14/17 1646 05/15/17 0459 05/15/17 1407 05/16/17 0451  WBC 12.3* 10.3 9.9  --  10.1  --  12.2*  CREATININE 2.43*  --  2.89* 3.27* 2.36* 2.17* 2.01*    Estimated Creatinine Clearance: 39 mL/min (A) (by C-G formula based on SCr of 2.01 mg/dL (H)).    Allergies  Allergen Reactions  . Penicillins Hives    Has patient had a PCN reaction causing immediate rash, facial/tongue/throat swelling, SOB or lightheadedness with hypotension: No Has patient had a PCN reaction causing severe rash involving mucus membranes or skin necrosis: No Has patient had a PCN reaction that required hospitalization: No Has patient had a PCN reaction occurring within the last 10 years: No If all of the above answers are "NO", then may proceed with Cephalosporin use.   . Nsaids     Should avoid   Antimicrobials this admission:  Azithro 11/27 >> 12/1 Ceftriaxone 11/27 >> 12/3 Ceftazidime 12/3>>  Dose adjustments this admission: N/A  Microbiology results:  11/26 Bcx x2: neg 11/26 resp panel: all negative 11/30 UCx - neg 11/27 MRSA PCR neg  Thank you for allowing pharmacy to be a part  of this patient's care.  Doylene Canard, PharmD Clinical Pharmacist  Pager: [phone] Clinical Phone for 05/16/2017 until 3:30pm: [phone] If after 3:30pm, please call main pharmacy at [phone] 05/16/2017 8:45 AM

## 2017-05-16 NOTE — Progress Notes (Signed)
Subjective:  Remains unstable on pressors and milrinone and CRRT - but 2900 negative - 530 of UOP  Objective Vital signs in last 24 hours: Vitals:   05/16/17 0600 05/16/17 0630 05/16/17 0700 05/16/17 0730  BP: 96/69 106/81 (!) 84/64 (!) 100/58  Pulse: (!) 101 100 (!) 104 (!) 101  Resp: 16 13 13 19   Temp:    97.6 F (36.4 C)  TempSrc:    Axillary  SpO2: 100% 99% 99% 98%  Weight: 96.7 kg (213 lb 3 oz)     Height:       Weight change: -1.3 kg (-13.9 oz)  Intake/Output Summary (Last 24 hours) at 05/16/2017 6314 Last data filed at 05/16/2017 0800 Gross per 24 hour  Intake 2163.35 ml  Output 5180 ml  Net -3016.65 ml    Assessment/ Plan: Pt is a 66 y.o. yo male with ischemic CM who was admitted on 05/09/2017 with decompensated CHF- has CKD at baseline (1.7-2.1) has required CRRT  Assessment/Plan: 1. Renal- CKD at baseline (1.7-2.1) now with A on CRF in the setting of decompensated heart failure requiring CRRT- started on 12/1- cont for now-  Some UOP- too soon to say if dialysis support will be needed long term-  But with pre CKD and poor heart function I would say is going to be difficult to maintain off  2. HTN/vol- overloaded- removed 3000 yest but with upward titration of pressors  - cont to remove volume as able  3. Anemia- not an issue right now  4. Elytes- stable    Mahum Betten A    Labs: Basic Metabolic Panel: Recent Labs  Lab 05/15/17 0459 05/15/17 1407 05/16/17 0451  NA 130* 131* 133*  K 4.5 4.5 4.5  CL 96* 98* 100*  CO2 24 27 26   GLUCOSE 112* 161* 167*  BUN 56* 42* 34*  CREATININE 2.36* 2.17* 2.01*  CALCIUM 8.5* 8.4* 8.2*  PHOS 3.3 2.8 3.1   Liver Function Tests: Recent Labs  Lab 05/12/17 0410 05/13/17 0444 05/14/17 0530  05/15/17 0459 05/15/17 1407 05/16/17 0451  AST 22 19 33  --   --   --   --   ALT 36 28 37  --   --   --   --   ALKPHOS 58 62 167*  --   --   --   --   BILITOT 0.6 0.8 0.6  --   --   --   --   PROT 6.2* 5.9* 6.1*  --   --    --   --   ALBUMIN 3.5 3.3* 3.0*   < > 3.0* 2.8* 2.7*   < > = values in this interval not displayed.   No results for input(s): LIPASE, AMYLASE in the last 168 hours. No results for input(s): AMMONIA in the last 168 hours. CBC: Recent Labs  Lab 05/09/17 1121  05/13/17 0444 05/13/17 1536 05/14/17 0530 05/15/17 0459 05/16/17 0451  WBC 8.5   < > 12.3* 10.3 9.9 10.1 12.2*  NEUTROABS 6.8  --   --   --  8.2*  --   --   HGB 13.4   < > 8.3* 12.2* 11.5* 11.4* 11.3*  HCT 43.9   < > 25.1* 35.7* 35.6* 34.7* 34.0*  MCV 104.8*   < > 100.0 98.6 97.0 97.7 98.0  PLT 155   < > 137* 104* 110* 123* 116*   < > = values in this interval not displayed.   Cardiac Enzymes: Recent Labs  Lab 05/09/17 1121 05/09/17 1607  TROPONINI 0.07* 0.07*   CBG: No results for input(s): GLUCAP in the last 168 hours.  Iron Studies: No results for input(s): IRON, TIBC, TRANSFERRIN, FERRITIN in the last 72 hours. Studies/Results: Dg Chest Port 1 View  Result Date: 05/16/2017 CLINICAL DATA:  Shortness of Breath EXAM: PORTABLE CHEST 1 VIEW COMPARISON:  May 14, 2017 FINDINGS: Right subclavian and right jugular catheter tips are in the superior vena cava. No pneumothorax. There is widespread airspace consolidation in both upper lobes, increased compared to recent study. There is patchy infiltrate in the left and right base regions which is stable. There is stable cardiomegaly with pulmonary venous hypertension. Pacemaker leads are attached the right atrium, right ventricle, and coronary sinus. 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 There is an atrial appendage clamp on the left. No adenopathy evident. No bone lesions. IMPRESSION: Increase in airspace opacity in the upper lobes bilaterally. Question increase in pulmonary edema versus increase in pneumonia. Both entities may exist concurrently. Patchy infiltrate in the bases is stable. Stable cardiomegaly with pulmonary venous hypertension consistent with a degree of underlying  pulmonary vascular congestion. No evident pneumothorax. Electronically Signed   By: Lowella Grip III M.D.   On: 05/16/2017 08:26   Dg Chest Port 1 View  Result Date: 05/14/2017 CLINICAL DATA:  Evaluate central venous line EXAM: PORTABLE CHEST 1 VIEW COMPARISON:  May 13, 2017 FINDINGS: A right subclavian line terminates in the central SVC. A right IJ terminates near the caval atrial junction. No pneumothorax. Cardiomegaly. Stable AICD device. Opacity in the left upper lobe is slightly more prominent. Suggested mild edema. Effusion and underlying opacity in the right base has improved. IMPRESSION: 1. The new right subclavian line is in good position with no pneumothorax. 2. Mildly worsened opacity in the left base. Improving opacity and effusion in the right base. Mild edema. Electronically Signed   By: Dorise Bullion III M.D   On: 05/14/2017 18:05   Medications: Infusions: . sodium chloride    . sodium chloride    . amiodarone 30 mg/hr (05/16/17 0700)  . DOBUTamine    . heparin 1,450 Units/hr (05/16/17 0700)  . norepinephrine (LEVOPHED) Adult infusion 14 mcg/min (05/16/17 0829)  . dialysis replacement fluid (prismasate) 200 mL/hr at 05/15/17 2009  . dialysis replacement fluid (prismasate) 400 mL/hr at 05/15/17 0726  . dialysate (PRISMASATE) 1,500 mL/hr at 05/16/17 0317  . sodium chloride      Scheduled Medications: . allopurinol  100 mg Oral Daily  . aspirin EC  81 mg Oral Daily  . atorvastatin  40 mg Oral q1800  . Chlorhexidine Gluconate Cloth  6 each Topical Daily  . GALACTIC Study - Placebo / omecamtiv mecarbil (PI-[place])  1 tablet Oral BID  . guaiFENesin  600 mg Oral BID  . mouth rinse  15 mL Mouth Rinse BID  . multivitamin with minerals  1 tablet Oral Daily  . sodium chloride flush  10-40 mL Intracatheter Q12H  . sodium chloride flush  3 mL Intravenous Q12H  . THROMBI-PAD  1 each Topical Once    have reviewed scheduled and prn medications.  Physical Exam: General:  alert but says SOB- Heart: tachy Lungs: CBS bilat Abdomen: obese, soft Extremities: pitting edema Dialysis Access: right Winfred cath- placed 12/1    05/16/2017,8:33 AM  LOS: 7 days

## 2017-05-16 NOTE — Research (Signed)
RESEARCH ENCOUNTER  Patient ID: Dillon Cisneros  DOB: Sep 15, 1950  Dillon Cisneros is currently hospitalized.  Week 8 visit of the [facility] performed at bedside.  Subject compliant with IP and IP returned.  Additional IP dispensed and is being held in the pharmacy.  Subject states that he is feeling better today compared to last night.  [facility] will [facility] with [facility] in 4 weeks.  [facility] RN will continue to closely follow throughout hospitalization.

## 2017-05-16 NOTE — Consult Note (Signed)
PULMONARY / CRITICAL CARE MEDICINE   Name: Dillon Cisneros MRN: [id number] DOB: [birth year]    ADMISSION DATE:  05/09/2017 CONSULTATION DATE:  12/3  REFERRING MD:  Loralie Champagne, MD  CHIEF COMPLAINT:  Worsening infiltrates, hypoxia  HISTORY OF PRESENT ILLNESS:   66 yr old ischemic cardiomyopathy, pacer, admitted end of November for new flutter in seting of CKD known. Flutter was treated and heart failure seemed to worsen. Pt renal function wosened and required cvvhd. On 11/26 pcxr did show some atx/infiltrate rt base. On 11/27 pcxr showed effusion likely on rt base. No resp distress during this time. milrinone was used. He was also treated empirically for CAP, with azithro, CTX. No sig sputum, mild temp 100. WBC slight up but had neg balance at same time on cvvhd. Despite above and neg 3 liters, pcxr today worsening and called to assess. He remains still without distress.  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  PAST SURGICAL HISTORY: He  has a past surgical history that includes Knee surgery; Shoulder surgery; Coronary artery bypass graft (12/25/2015); Joint replacement; and TEE without cardioversion (N/A, 05/03/2017).  Allergies  Allergen Reactions  . Penicillins Hives    Has patient had a PCN reaction causing immediate rash, facial/tongue/throat swelling, SOB or lightheadedness with hypotension: No Has patient had a PCN reaction causing severe rash involving mucus membranes or skin necrosis: No Has  patient had a PCN reaction that required hospitalization: No Has patient had a PCN reaction occurring within the last 10 years: No If all of the above answers are "NO", then may proceed with Cephalosporin use.   . Nsaids     Should avoid    No current facility-administered medications on file prior to encounter.    Current Outpatient Medications on File Prior to Encounter  Medication Sig  . allopurinol (ZYLOPRIM) 300 MG tablet Take 1 tablet (300 mg total) by mouth daily.  Marland Kitchen aspirin EC 81 MG tablet Take 81 mg by mouth daily.  Marland Kitchen atorvastatin (LIPITOR) 40 MG tablet Take 1 tablet (40 mg total) by mouth daily at 6 PM.  . bumetanide (BUMEX) 1 MG tablet Take 2 mg (2 Tabs) in the AM and 1 mg (1 tab) in PM, alternating with 1 mg (1 tab), twice a day.  . carvedilol (COREG) 3.125 MG tablet Take 1 tablet (3.125 mg total) 2 (two) times daily by mouth.  . cholecalciferol (VITAMIN D) 1000 units tablet Take 2,000 Units by mouth daily.  Steffanie Dunn 5 MG TABS tablet Take 5 mg by mouth 2 (two) times daily.  . ferrous sulfate (IRON SUPPLEMENT) 325 (65 FE) MG tablet Take 325 mg by mouth daily with breakfast.  . Investigational - Study Medication Take 1 tablet by mouth 2 (two) times daily. Study name: Galactic Heart Failure Research Study Additional study details: Omecamtiv Mecarbil or Placebo  . Multiple Vitamin (MULTIVITAMIN) capsule Take 1 capsule by mouth daily.  . nitroGLYCERIN (NITROSTAT) 0.4 MG SL tablet Place 1 tablet (0.4 mg total) under the tongue every 5 (five) minutes as needed for chest pain.  . sacubitril-valsartan (ENTRESTO) 24-26 MG Take 1 tablet by mouth 2 (two) times daily.  Marland Kitchen  vitamin C (ASCORBIC ACID) 500 MG tablet Take 500 mg by mouth daily.    FAMILY HISTORY:  His indicated that his mother is deceased. He indicated that his father is deceased. He indicated that both of his brothers are alive. He indicated that his maternal grandmother is deceased. He indicated that his maternal grandfather  is deceased. He indicated that his paternal grandmother is deceased. He indicated that his paternal grandfather is deceased.   SOCIAL HISTORY: He  reports that he quit smoking about 16 years ago. His smoking use included cigarettes. He started smoking about 46 years ago. He smoked 0.50 packs per day. he has never used smokeless tobacco. He reports that he drinks alcohol. He reports that he does not use drugs.  REVIEW OF SYSTEMS:   No ear , eye pain , discharge Mild wet cough no sputum production, no chills No CP, no sig SOB No hemoptysis, no abdo pain, no n, v, d, constipation Edema, no rash, no icthing, no weakness, no visal changes All ROS completed  SUBJECTIVE:  Wet cough No sob  VITAL SIGNS: BP (!) 100/58   Pulse (!) 101   Temp 97.6 F (36.4 C) (Axillary) Comment (Src): tried oral and could not get reading  Resp 19   Ht 5' 7"  (1.702 m)   Wt 96.7 kg (213 lb 3 oz)   SpO2 98%   BMI 33.39 kg/m   HEMODYNAMICS: CVP:  [13 mmHg-26 mmHg] 13 mmHg  VENTILATOR SETTINGS: FiO2 (%):  [55 %-100 %] 80 %  INTAKE / OUTPUT: I/O last 3 completed shifts: In: 3044 [P.O.:390; I.V.:2654] Out: 6770 [Urine:690; HEKBT:2481; Stool:1]  PHYSICAL EXAMINATION: General:  Awake, alert, no overt distress Neuro: awake, alert, nonfocal HEENT:  Line subclavian old blood, biopatch, no redness, no discharge Cardiovascular:  s1 s2 pacerd distant Lungs:   Crackles, apical, bilateral, left greater rt Abdomen:  Soft, BS low, edema wall, no r Musculoskeletal:  No joint swelling Skin:  No rash  LABS:  BMET Recent Labs  Lab 05/15/17 0459 05/15/17 1407 05/16/17 0451  NA 130* 131* 133*  K 4.5 4.5 4.5  CL 96* 98* 100*  CO2 24 27 26   BUN 56* 42* 34*  CREATININE 2.36* 2.17* 2.01*  GLUCOSE 112* 161* 167*    Electrolytes Recent Labs  Lab 05/14/17 0530  05/15/17 0459 05/15/17 1407 05/16/17 0451  CALCIUM 8.8*   < > 8.5* 8.4* 8.2*  MG 2.3  --  2.5*  --  2.4  PHOS 4.1   < > 3.3 2.8 3.1   < >  = values in this interval not displayed.    CBC Recent Labs  Lab 05/14/17 0530 05/15/17 0459 05/16/17 0451  WBC 9.9 10.1 12.2*  HGB 11.5* 11.4* 11.3*  HCT 35.6* 34.7* 34.0*  PLT 110* 123* 116*    Coag's Recent Labs  Lab 05/15/17 0459 05/15/17 1407 05/15/17 2346  APTT 64* 66* 56*    Sepsis Markers Recent Labs  Lab 05/14/17 1134  PROCALCITON 1.68    ABG No results for input(s): PHART, PCO2ART, PO2ART in the last 168 hours.  Liver Enzymes Recent Labs  Lab 05/12/17 0410 05/13/17 0444 05/14/17 0530  05/15/17 0459 05/15/17 1407 05/16/17 0451  AST 22 19 33  --   --   --   --   ALT 36 28 37  --   --   --   --   ALKPHOS 58 62 167*  --   --   --   --  BILITOT 0.6 0.8 0.6  --   --   --   --   ALBUMIN 3.5 3.3* 3.0*   < > 3.0* 2.8* 2.7*   < > = values in this interval not displayed.    Cardiac Enzymes Recent Labs  Lab 05/09/17 1121 05/09/17 1607  TROPONINI 0.07* 0.07*    Glucose No results for input(s): GLUCAP in the last 168 hours.  Imaging Dg Chest Port 1 View  Result Date: 05/16/2017 CLINICAL DATA:  Shortness of Breath EXAM: PORTABLE CHEST 1 VIEW COMPARISON:  May 14, 2017 FINDINGS: Right subclavian and right jugular catheter tips are in the superior vena cava. No pneumothorax. There is widespread airspace consolidation in both upper lobes, increased compared to recent study. There is patchy infiltrate in the left and right base regions which is stable. There is stable cardiomegaly with pulmonary venous hypertension. Pacemaker leads are attached the right atrium, right ventricle, and coronary sinus. 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 There is an atrial appendage clamp on the left. No adenopathy evident. No bone lesions. IMPRESSION: Increase in airspace opacity in the upper lobes bilaterally. Question increase in pulmonary edema versus increase in pneumonia. Both entities may exist concurrently. Patchy infiltrate in the bases is stable. Stable cardiomegaly  with pulmonary venous hypertension consistent with a degree of underlying pulmonary vascular congestion. No evident pneumothorax. Electronically Signed   By: Lowella Grip III M.D.   On: 05/16/2017 08:26     STUDIES:   CULTURES: BC 12/3>>> UA SPUTUM 12/3>>>  ANTIBIOTICS: ceftaz 12/3>>> Azithro 11/27>>>12/1 11/27 >>>12/3  SIGNIFICANT EVENTS: 12/ cvvhd  LINES/TUBES: 12/1 left sub clav>>>   ASSESSMENT / PLAN:  PULMONARY A: Bilateral infiltrates, favor pulm edema / effusions Rule out PNA Hypoxia P:   I think if this was PNA, he would be on a vent Despite 3 liters off, he worsen with pcxr, maintain neg balance further same  See ID Would favor CT chest non contrast assessment, ensure no layering effusions / assess well defined infiltrate Send sputum if produced pcxr in am  Assess esr, pct supplemental O2 to says 94% or greater  CARDIOVASCULAR A:  Acute sys HF< cardiogenic shock P:  To dobutamine Dc milrinone Levophed to map goals  cvp accuracy? Only 13 but overall appears so grossly overloaded  RENAL A:   ACute on chronic renal failure MIld hyponatremia P:   Cvvhd,. Maintain same neg goals Chem in am  kvo  GASTROINTESTINAL A:   ABdo wall edema P:   cvvhd deit  HEMATOLOGIC A:   DVT risk P:  Heparin drip covers Cbc in am for plat Expect to see am hemoconcetration  INFECTIOUS A:   RUle out PNA P:   Pct ( remember that about 15% of pct is cleared renally) - may see it flat low In meantime given levophed needs, change to ceftaz HCAP until we get ct Ct chest BC Sputum if able  ENDOCRINE A:   At risk hyperglcyemia rul eout adrenal insuff P:   Consider cbg increase cortisol  NEUROLOGIC A:   NO pain P:   RASS goal: 0 Avoid benzo   FAMILY  - Updates: to pt by me  - Inter-disciplinary family meet or Palliative Care meeting due by: 12/3  Ccm time 35 min    Lavon Paganini. Titus Mould, MD, [place] Pgr: [facility]  [facility]ulmonary and [place] Pager: [phone]  05/16/2017, 8:33 AM

## 2017-05-16 NOTE — Plan of Care (Signed)
Patients care plan reviewed and updated.

## 2017-05-16 NOTE — Progress Notes (Addendum)
Patient ID: Dillon Cisneros, male   DOB: 21-Jul-1950, 66 y.o.   MRN: [id number]     Advanced Heart Failure Rounding Note  PCP: Meda Coffee Cardiologist: Aundra Dubin  Subjective:   11/27 S/P TEE-guided DCCV from flutter to NSR with improved BiV pacing.   11/27developed acute dyspnea and dizziness. Frequent PVCs. He was hypotensive. Amio increased to 60 mg per hour, norepi added, and dobutamine 2.5 mcg started with low co-ox.   He continued to diurese poorly despite inotrope support with rise in creatinine, CVP > 20. We attempted transition from dobutamine to milrinone but this did not affect UOP significantly.  Renal consulted 12/1 and CVVH started.  Lasix gtt and apixaban stopped and heparin gtt started.   Remains on CVVH with UF rate of 130 this am. Some fluid given back overnight. Co-ox 77.2% this am. CVP 12-13. Had 500+ cc of UOP.   Currently on milrinone 0.25, amiodarone 30 mg/hr, norepinephrine 14.   He remains on ceftriaxone, PCT 1.68 05/14/2017  Fatigued this morning with step back overnight. More hypotensive in 60-70s overnight (norepinephrine increased) and Tmax 100.0. Pink tinged sputum overnight. Heparin held and then restarted. Still short of breath with cough but some improvement (-2919 net with CVVH yesterday).   CXR: Diffuse airspace disease bilaterally.  Looks like pulmonary edema, cannot rule out PNA.   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  Objective:   Weight Range: 213 lb 3 oz (96.7 kg) Body mass index is 33.39 kg/m.   Vital Signs:   Temp:  [97.8 F (36.6 C)-100 F (37.8 C)] 97.8 F (36.6 C) (12/03 0500) Pulse Rate:  [43-131] 101 (12/03 0730) Resp:  [13-39] 19 (12/03 0730) BP: (64-112)/(47-98) 100/58 (12/03 0730) SpO2:  [85 %-100 %] 98 % (12/03 0730) FiO2 (%):  [55 %-100 %] 80 % (12/03 0400) Weight:  [213 lb 3 oz  (96.7 kg)] 213 lb 3 oz (96.7 kg) (12/03 0600) Last BM Date: 05/14/17  Weight change: Filed Weights   05/14/17 0700 05/15/17 0700 05/16/17 0600  Weight: 205 lb 0.4 oz (93 kg) 216 lb 0.8 oz (98 kg) 213 lb 3 oz (96.7 kg)    Intake/Output:   Intake/Output Summary (Last 24 hours) at 05/16/2017 0738 Last data filed at 05/16/2017 0700 Gross per 24 hour  Intake 2238.55 ml  Output 5158 ml  Net -2919.45 ml    Physical Exam  CVP 12-13  General: NAD.  HEENT: Normal Neck: Supple. JVP at least 12 cm. Carotids 2+ bilat; no bruits. No thyromegaly or nodule noted. Cor: PMI lateral. RRR, 1/6 SEM RUSB. 1+ edema to knees.  Lungs: Diminished basilar sounds with mild crackles.  Abdomen: Soft, non-tender, non-distended, no HSM. No bruits or masses. +BS  Extremities: No cyanosis, clubbing, or rash. R and LLE no edema.  Neuro: Alert & orientedx3, cranial nerves grossly intact. moves all 4 extremities w/o difficulty. Affect pleasant   Telemetry   BiV pacing in 100s, in Afib up to 120s overnight, Personally reviewed.   Labs    CBC Recent Labs    05/14/17 0530 05/15/17 0459 05/16/17 0451  WBC 9.9 10.1 12.2*  NEUTROABS 8.2*  --   --   HGB 11.5* 11.4* 11.3*  HCT 35.6* 34.7* 34.0*  MCV 97.0 97.7 98.0  PLT 110* 123* 063*   Basic Metabolic Panel Recent Labs    05/15/17 0459 05/15/17 1407 05/16/17 0451  NA 130* 131* 133*  K 4.5 4.5 4.5  CL 96* 98* 100*  CO2 24 27 26   GLUCOSE 112* 161* 167*  BUN 56* 42* 34*  CREATININE 2.36* 2.17* 2.01*  CALCIUM 8.5* 8.4* 8.2*  MG 2.5*  --  2.4  PHOS 3.3 2.8 3.1   Liver Function Tests Recent Labs    05/14/17 0530  05/15/17 1407 05/16/17 0451  AST 33  --   --   --   ALT 37  --   --   --   ALKPHOS 167*  --   --   --   BILITOT 0.6  --   --   --   PROT 6.1*  --   --   --   ALBUMIN 3.0*   < > 2.8* 2.7*   < > = values in this interval not displayed.   No results for input(s): LIPASE, AMYLASE in the last 72 hours. Cardiac Enzymes No results  for input(s): CKTOTAL, CKMB, CKMBINDEX, TROPONINI in the last 72 hours.  BNP: BNP (last 3 results) Recent Labs    05/11/17 0357 05/12/17 0410 05/13/17 0444  BNP 1,412.5* 1,720.5* 2,177.6*    ProBNP (last 3 results) No results for input(s): PROBNP in the last 8760 hours.   D-Dimer No results for input(s): DDIMER in the last 72 hours. Hemoglobin A1C No results for input(s): HGBA1C in the last 72 hours. Fasting Lipid Panel No results for input(s): CHOL, HDL, LDLCALC, TRIG, CHOLHDL, LDLDIRECT in the last 72 hours. Thyroid Function Tests No results for input(s): TSH, T4TOTAL, T3FREE, THYROIDAB in the last 72 hours.  Invalid input(s): FREET3  Other results:   Imaging    No results found.   Medications:     Scheduled Medications: . allopurinol  100 mg Oral Daily  . aspirin EC  81 mg Oral Daily  . atorvastatin  40 mg Oral q1800  . Chlorhexidine Gluconate Cloth  6 each Topical Daily  . GALACTIC Study - Placebo / omecamtiv mecarbil (PI-Mikahla Wisor)  1 tablet Oral BID  . guaiFENesin  600 mg Oral BID  . mouth rinse  15 mL Mouth Rinse BID  . multivitamin with minerals  1 tablet Oral Daily  . sodium chloride flush  10-40 mL Intracatheter Q12H  . sodium chloride flush  3 mL Intravenous Q12H  . THROMBI-PAD  1 each Topical Once    Infusions: . sodium chloride    . sodium chloride    . amiodarone 30 mg/hr (05/16/17 0700)  . cefTRIAXone (ROCEPHIN)  IV Stopped (05/15/17 1443)  . heparin 1,450 Units/hr (05/16/17 0700)  . milrinone 0.25 mcg/kg/min (05/16/17 0700)  . norepinephrine (LEVOPHED) Adult infusion    . dialysis replacement fluid (prismasate) 200 mL/hr at 05/15/17 2009  . dialysis replacement fluid (prismasate) 400 mL/hr at 05/15/17 0726  . dialysate (PRISMASATE) 1,500 mL/hr at 05/16/17 0317  . sodium chloride      PRN Medications: Place/Maintain arterial line **AND** sodium chloride, acetaminophen, alteplase, heparin, nitroGLYCERIN, ondansetron (ZOFRAN) IV, sodium  chloride, sodium chloride flush, sodium chloride flush    Patient Profile   66 yo with history of CAD, aortic and mitral valve disease, ischemic cardiomyopathy, and CKD stage 3 was admitted with acute on chronic systolic CHF, ?PNA, AKI on CKD, and atrial flutter.   Assessment/Plan   1. Cardiogenic Shock: Started on norepi and dobutamine => transitioned to milrinone with ongoing poor UOP.  - SBPs low overnight on CVVHD.  - Coox 77% this am.  - Remains on pressor  support as below.  2. Acute on chronic systolic CHF: Ischemic cardiomyopathy.  EF 20-25% on 6/18 echo.  St Jude CRT-D device.  - Remains markedly volume overloaded - CVP 12-13 cm  - Coox 77% this am on milrinone 0.25 mcg/kg/min, norepinep 14, and amio 30.  - Goal UF to 150 cc/hr.   - He is off Entresto, spironolactone, and Coreg.   - Will discuss with research team regarding Galactic study drug.  - At baseline, percentage of BiV pacing is below goal.  Likely due to mode switching with baseline sinus tachy and faulty atrial lead.  Will need atrial lead re-worked after he recovers from current episode. EP following.  3. CAD: S/p CABG.   - No s/s of ischemia. TnI mildly elevated with no trend, likely due to demand ischemia/volume overload.   - Continue ASA 81.  - Continue atorvastatin 40 daily.  4. Bioprosthetic aortic valve: Stable on last echo.  5. S/p mitral valve repair: Stable on last echo.  6. AKI on CKD stage 3 - Remains on CVVH.  7. Atrial flutter: New for him, with RVR.  Has triggered CHF and likely AKI.  S/P TEE-guided DC-CV.   - Remains in NSR with BiV pacing today. + PVCs.  - Continue amio drip at 30 mg/hr. Re-bolused overnight - St Jude to interrogate ICD to determine rhythm this am.  - Eliquis stopped and now on heparin gtt with CVVH.  8. ?PNA: Bibasilar airspace disease.  He was started on azithromycin/ceftriaxone empirically. PCT elevated 05/14/17 at 1.68.  He is afebrile with normal WBCs.  Cultures negative so  far, has not been able to cough anything up for sputum culture.  - Continue ceftriaxone. Azythromycin stopped yesterday.  - Will re-culture.  - Will recheck CXR and PCT this am. Consult PCCM.  9. Frequent PVCs/NSVT:  - Continue amio drip.   Length of Stay: [address]  Annamaria Helling 05/16/2017 7:38 AM   Advanced Heart Failure Team Pager [phone] (M-F; 7a - 4p)  Please contact [facility] for night-coverage after hours (4p -7a ) and weekends on [email or web address]  Patient seen with PA, agree with the above note.  I made adjustments to the note to reflect my thoughts.  He had a difficult night, fall in SBP necessitating increase in norepinephrine.  Low grade fever.  CVVH ongoing at 130 cc/hr UF, -2919 net with some improvement in breathing but still short of breath with cough.  On exam, he has JVD and bilateral crackles with decreased BS at bases.  1+ ankle edema.   Good co-ox this morning.  I am going to transition from milrinone back to dobutamine with renal failure/CVVH.  Will use 2.5 mcg/kg/min. Continue current norepinephrine.   CXR reviewed: Bilateral airspace disease, probably edema but cannot rule out underlying PNA.  Discussed with Dr. Titus Mould who will see patient today.  We will get CT chest w/o contrast and will change abx coverage to ceftazidime for now.    CVP 13.  Continue UF via CVVH, aim for UF 150 cc/hr for now.   Device interrogated: He remains in NSR today and was not in atrial fibrillation/flutter at all overnight.   Long-term prognosis concerning.  At this point, will use CVVH to remove volume then will try to get him off CVVH and pressors. Hopefully hemodynamics will improve with volume off and will lead to improved renal function.   CRITICAL CARE Performed by: Loralie Champagne  Total critical care time: 40 minutes  Critical care  time was exclusive of separately billable procedures and treating other patients.  Critical care was necessary to treat or prevent  imminent or life-threatening deterioration.  Critical care was time spent personally by me on the following activities: development of treatment plan with patient and/or surrogate as well as nursing, discussions with consultants, evaluation of patient's response to treatment, examination of patient, obtaining history from patient or surrogate, ordering and performing treatments and interventions, ordering and review of laboratory studies, ordering and review of radiographic studies, pulse oximetry and re-evaluation of patient's condition.  Loralie Champagne 05/16/2017 8:36 AM

## 2017-05-16 NOTE — Progress Notes (Signed)
ANTICOAGULATION CONSULT NOTE - Follow Up Consult  Pharmacy Consult for heparin Indication: atrial fibrillation  Labs: Recent Labs    05/13/17 1536 05/14/17 0530 05/14/17 1646 05/15/17 0459 05/15/17 1407 05/15/17 2346  HGB 12.2* 11.5*  --  11.4*  --   --   HCT 35.7* 35.6*  --  34.7*  --   --   PLT 104* 110*  --  123*  --   --   APTT  --   --   --  64* 66* 56*  HEPARINUNFRC  --   --   --  >2.20*  --  1.90*  CREATININE  --  2.89* 3.27* 2.36* 2.17*  --     Assessment: RN called w/ concern due to hemoptysis in this pt on heparin after some doses of Eliquis; RN held infusion and drew stat PTT; PTT below goal and hemoptysis now resolved, pt also likely in pulmonary edema.  Goal of Therapy:  aPTT 66-102 seconds   Plan:  Will resume heparin gtt at previous rate of 1450 units/hr and recheck PTT in 8hr, f/u any further signs of bleeding.  Wynona Neat, PharmD, BCPS  05/16/2017,1:25 AM

## 2017-05-16 NOTE — Progress Notes (Signed)
ANTICOAGULATION CONSULT NOTE - Follow-Up Consult  Pharmacy Consult for heparin > apixaban > heparin Indication: atrial fibrillation  Allergies  Allergen Reactions  . Penicillins Hives    Has patient had a PCN reaction causing immediate rash, facial/tongue/throat swelling, SOB or lightheadedness with hypotension: No Has patient had a PCN reaction causing severe rash involving mucus membranes or skin necrosis: No Has patient had a PCN reaction that required hospitalization: No Has patient had a PCN reaction occurring within the last 10 years: No If all of the above answers are "NO", then may proceed with Cephalosporin use.   . Nsaids     Should avoid    Patient Measurements: Height: 5\' 7"  (170.2 cm) Weight: 213 lb 3 oz (96.7 kg) IBW/kg (Calculated) : 66.1 Heparin Dosing Weight: 84.5kg  Vital Signs: Temp: 97.6 F (36.4 C) (12/03 0730) Temp Source: Axillary (12/03 0730) BP: 98/63 (12/03 1100) Pulse Rate: 138 (12/03 1100)  Labs: Recent Labs    05/14/17 0530  05/15/17 0459 05/15/17 1407 05/15/17 2346 05/16/17 0451 05/16/17 0922  HGB 11.5*  --  11.4*  --   --  11.3*  --   HCT 35.6*  --  34.7*  --   --  34.0*  --   PLT 110*  --  123*  --   --  116*  --   APTT  --    < > 64* 66* 56*  --  82*  HEPARINUNFRC  --   --  >2.20*  --  1.90*  --   --   CREATININE 2.89*   < > 2.36* 2.17*  --  2.01*  --    < > = values in this interval not displayed.    Estimated Creatinine Clearance: 39 mL/min (A) (by C-G formula based on SCr of 2.01 mg/dL (H)).   Medical History: Past Medical History:  Diagnosis Date  . Anemia   . Arthritis    arthritis  . Atrial fibrillation ([place]) 12/28/2015  . Basal cell carcinoma 2011   scalp/neck/forehead  . CAD (coronary artery disease) 06/21/1999  . 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 CHF (congestive heart failure) ([place]) 06/21/1999  . Chronic kidney disease 2010   stage 4 kidney failure  . Gout   . Hx  of mitral valve repair 12/2015  . 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 Hyperlipidemia   . Hypertension 1998  . Thrombocytopenia ([place]) 2017  . Ventricular arrhythmia 2001    Assessment: 63 YOM transferred from [facility] with A-flutter. Underwent successful TEE-DCCV.  Patient was not on anticoagulation PTA.   Patient was previously on heparin drip for afib and transitioned to apixaban post DCCV.  Since then has developed HF exacerbation with volume overload - not responding to diuretics> plan to start CRT.  Last dose apixaban 12/1 am plan for HD cath placement late today and start heparin drip for Afib after line placed.  Will use aptt for heparin dosing given apixaban will falsely elevate the heparin level.    Heparin drip 1450 uts/hr aPTT 82 (at goal) No signs/symptoms of bleeding.   Goal of Therapy:  APTT goal 66-102 sec Heparin level 0.3-0.7 units/ml Monitor platelets by anticoagulation protocol: Yes   Plan:  Continue IV heparin at current rate.  Daily heparin level and PTT. F/u ability to resume Eliquis eventually.  Uvaldo Rising, BCPS  Clinical Pharmacist Pager [phone]  05/16/2017 11:19 AM

## 2017-05-16 NOTE — Progress Notes (Signed)
Per MD Aundra Dubin, stopping milrinone and starting dobutamine.  Discussed with pharmacy, pharmacy advised that due to half life of milrinone RN did not need to wait to start dobutamine after milrinone stopped.

## 2017-05-16 NOTE — Progress Notes (Signed)
Chart reviewed in detail, discussed with Dr. Aundra Dubin. With involvement of pulmonary, TRH with little to add. Dr. Aundra Dubin will take over care. TRH will sign off. Please call if we can be of assistance.  Murray Hodgkins, MD [facility] [phone]

## 2017-05-16 NOTE — Progress Notes (Addendum)
Patient's heart rate 115-120 consistently with bursts in to the 130's. Frequent PVCs which is not new.   Patient had one episode of heart rate in the 160's at 1137, strip marked and saved.    Paged HF pager to inform of heart rate as dobutamine was started this morning, spoke with PA [facility]

## 2017-05-17 ENCOUNTER — Other Ambulatory Visit: Payer: Self-pay

## 2017-05-17 DIAGNOSIS — A419 Sepsis, unspecified organism: Secondary | ICD-10-CM

## 2017-05-17 DIAGNOSIS — J690 Pneumonitis due to inhalation of food and vomit: Secondary | ICD-10-CM

## 2017-05-17 DIAGNOSIS — R6521 Severe sepsis with septic shock: Secondary | ICD-10-CM

## 2017-05-17 LAB — GLUCOSE, CAPILLARY
GLUCOSE-CAPILLARY: 126 mg/dL — AB (ref 65–99)
Glucose-Capillary: 117 mg/dL — ABNORMAL HIGH (ref 65–99)
Glucose-Capillary: 134 mg/dL — ABNORMAL HIGH (ref 65–99)
Glucose-Capillary: 141 mg/dL — ABNORMAL HIGH (ref 65–99)

## 2017-05-17 LAB — CORTISOL: CORTISOL PLASMA: 41.3 ug/dL

## 2017-05-17 LAB — COOXEMETRY PANEL
Carboxyhemoglobin: 1.1 % (ref 0.5–1.5)
Methemoglobin: 1.3 % (ref 0.0–1.5)
O2 Saturation: 69.3 %
Total hemoglobin: 11.5 g/dL — ABNORMAL LOW (ref 12.0–16.0)

## 2017-05-17 LAB — MAGNESIUM: Magnesium: 2.6 mg/dL — ABNORMAL HIGH (ref 1.7–2.4)

## 2017-05-17 LAB — RENAL FUNCTION PANEL
ALBUMIN: 2.8 g/dL — AB (ref 3.5–5.0)
ANION GAP: 12 (ref 5–15)
Albumin: 2.9 g/dL — ABNORMAL LOW (ref 3.5–5.0)
Anion gap: 10 (ref 5–15)
BUN: 28 mg/dL — AB (ref 6–20)
BUN: 24 mg/dL — ABNORMAL HIGH (ref 6–20)
CALCIUM: 8.7 mg/dL — AB (ref 8.9–10.3)
CHLORIDE: 95 mmol/L — AB (ref 101–111)
CO2: 26 mmol/L (ref 22–32)
CO2: 25 mmol/L (ref 22–32)
CREATININE: 1.96 mg/dL — AB (ref 0.61–1.24)
Calcium: 8.3 mg/dL — ABNORMAL LOW (ref 8.9–10.3)
Chloride: 95 mmol/L — ABNORMAL LOW (ref 101–111)
Creatinine, Ser: 1.86 mg/dL — ABNORMAL HIGH (ref 0.61–1.24)
GFR calc Af Amer: 41 mL/min — ABNORMAL LOW (ref >60)
GFR, EST AFRICAN AMERICAN: 39 mL/min — AB (ref >60)
GFR, EST NON AFRICAN AMERICAN: 36 mL/min — AB (ref >60)
GFR, EST NON AFRICAN AMERICAN: 33 mL/min — AB (ref >60)
Glucose, Bld: 128 mg/dL — ABNORMAL HIGH (ref 65–99)
Glucose, Bld: 145 mg/dL — ABNORMAL HIGH (ref 65–99)
Phosphorus: 2.7 mg/dL (ref 2.5–4.6)
Phosphorus: 2.4 mg/dL — ABNORMAL LOW (ref 2.5–4.6)
Potassium: 4.6 mmol/L (ref 3.5–5.1)
Potassium: 4.5 mmol/L (ref 3.5–5.1)
SODIUM: 131 mmol/L — AB (ref 135–145)
SODIUM: 132 mmol/L — AB (ref 135–145)

## 2017-05-17 LAB — APTT
APTT: 72 s — AB (ref 24–36)
aPTT: 55 seconds — ABNORMAL HIGH (ref 24–36)

## 2017-05-17 LAB — CBC
HEMATOCRIT: 34.8 % — AB (ref 39.0–52.0)
Hemoglobin: 11.2 g/dL — ABNORMAL LOW (ref 13.0–17.0)
MCH: 31.7 pg (ref 26.0–34.0)
MCHC: 32.2 g/dL (ref 30.0–36.0)
MCV: 98.6 fL (ref 78.0–100.0)
Platelets: 114 10*3/uL — ABNORMAL LOW (ref 150–400)
RBC: 3.53 MIL/uL — ABNORMAL LOW (ref 4.22–5.81)
RDW: 16.7 % — AB (ref 11.5–15.5)
WBC: 12.2 10*3/uL — ABNORMAL HIGH (ref 4.0–10.5)

## 2017-05-17 LAB — HEPARIN LEVEL (UNFRACTIONATED): Heparin Unfractionated: 1.12 IU/mL — ABNORMAL HIGH (ref 0.30–0.70)

## 2017-05-17 MED ORDER — HYDROCORTISONE NA SUCCINATE PF 100 MG IJ SOLR
50.0000 mg | Freq: Four times a day (QID) | INTRAMUSCULAR | Status: DC
  Administered 2017-05-17 (×2): 50 mg via INTRAVENOUS
  Filled 2017-05-17 (×2): qty 2

## 2017-05-17 MED ORDER — POTASSIUM & SODIUM PHOSPHATES 280-160-250 MG PO PACK
1.0000 | PACK | Freq: Every day | ORAL | Status: DC
Start: 2017-05-17 — End: 2017-05-19
  Administered 2017-05-17 – 2017-05-18 (×2): 1 via ORAL
  Filled 2017-05-17 (×3): qty 1

## 2017-05-17 NOTE — Progress Notes (Signed)
Subjective:  Remains unstable on pressors, dobutamine and CRRT -  2900 negative- now minimal UOP- low grade temps but subjectively reports that he feels better   Objective Vital signs in last 24 hours: Vitals:   05/17/17 0630 05/17/17 0645 05/17/17 0700 05/17/17 0715  BP: 93/79 (!) 87/71 98/67 98/70   Pulse: (!) 110 (!) 109 (!) 110 (!) 109  Resp: (!) 24 (!) 22 20 18   Temp:      TempSrc:      SpO2: 99% 100% 100% 100%  Weight:      Height:       Weight change: -8.5 kg (-11.8 oz)  Intake/Output Summary (Last 24 hours) at 05/17/2017 1700 Last data filed at 05/17/2017 0700 Gross per 24 hour  Intake 1810.53 ml  Output 4605 ml  Net -2794.47 ml    Assessment/ Plan: Pt is a 66 y.o. yo male with ischemic CM who was admitted on 05/09/2017 with decompensated CHF- has CKD at baseline (1.7-2.1) has required CRRT  Assessment/Plan: 1. Renal- CKD at baseline (1.7-2.1) now with A on CRF in the setting of decompensated heart failure requiring CRRT- started on 12/1- cont for now-  Some UOP- too soon to say if dialysis support will be needed long term-  But with pre CKD and poor heart function I would say is going to be difficult to maintain off of RRT- I have told him this 2. HTN/vol- overloaded- removed 3000 again yest pressors coming down - CVP in the teens cont to remove volume as able- CXR on 12/3 showed worsening airspace dz and still SOB with inc O2 req  3. Anemia- not an issue right now  4. [place]- K is OK- will give phos orally    Averlee Swartz A    Labs: Basic Metabolic Panel: Recent Labs  Lab 05/16/17 0451 05/16/17 1650 05/17/17 0401  NA 133* 132* 131*  K 4.5 4.4 4.6  CL 100* 98* 95*  CO2 26 26 26   GLUCOSE 167* 131* 128*  BUN 34* 33* 28*  CREATININE 2.01* 2.12* 1.86*  CALCIUM 8.2* 8.3* 8.3*  PHOS 3.1 2.6 2.7   Liver Function Tests: Recent Labs  Lab 05/12/17 0410 05/13/17 0444 05/14/17 0530  05/16/17 0451 05/16/17 1650 05/17/17 0401  AST 22 19 33  --   --   --    --   ALT 36 28 37  --   --   --   --   ALKPHOS 58 62 167*  --   --   --   --   BILITOT 0.6 0.8 0.6  --   --   --   --   PROT 6.2* 5.9* 6.1*  --   --   --   --   ALBUMIN 3.5 3.3* 3.0*   < > 2.7* 3.1* 2.8*   < > = values in this interval not displayed.   No results for input(s): LIPASE, AMYLASE in the last 168 hours. No results for input(s): AMMONIA in the last 168 hours. CBC: Recent Labs  Lab 05/13/17 1536 05/14/17 0530 05/15/17 0459 05/16/17 0451 05/17/17 0401  WBC 10.3 9.9 10.1 12.2* 12.2*  NEUTROABS  --  8.2*  --   --   --   HGB 12.2* 11.5* 11.4* 11.3* 11.2*  HCT 35.7* 35.6* 34.7* 34.0* 34.8*  MCV 98.6 97.0 97.7 98.0 98.6  PLT 104* 110* 123* 116* 114*   Cardiac Enzymes: No results for input(s): CKTOTAL, CKMB, CKMBINDEX, TROPONINI in the last 168 hours. CBG: Recent Labs  Lab 05/16/17 1236 05/16/17 1656 05/16/17 2158  GLUCAP 122* 126* 139*    Iron Studies: No results for input(s): IRON, TIBC, TRANSFERRIN, FERRITIN in the last 72 hours. Studies/Results: Ct Chest Wo Contrast  Result Date: 05/16/2017 CLINICAL DATA:  Increase shortness of breath with weakness. Abnormal chest radiograph. EXAM: CT CHEST WITHOUT CONTRAST TECHNIQUE: Multidetector CT imaging of the chest was performed following the standard protocol without IV contrast. COMPARISON:  Chest radiograph 05/16/2017. FINDINGS: Cardiovascular: Right IJ central line terminates in the SVC. Atherosclerotic calcification of the arterial vasculature. Ascending aorta measures 4.2 cm. Pulmonary arteries and heart are enlarged. No pericardial effusion. Mediastinum/Nodes: Mediastinal lymph nodes measure up to 1.6 cm in the low right paratracheal station. Hilar regions are difficult to evaluate without IV contrast. No axillary adenopathy. Esophagus is grossly unremarkable. Lungs/Pleura: There is patchy bilateral ground-glass and septal thickening with collapse/ consolidation in the right lower lobe and compressive atelectasis in the  left lower lobe. Partially loculated moderate right pleural effusion. Small left pleural effusion. Scattered calcified granulomas. Airway is unremarkable. Upper Abdomen: Visualized portions of the liver, spleen and stomach are grossly unremarkable. Small perihepatic ascites. Musculoskeletal: Degenerative changes in the spine. No worrisome lytic or sclerotic lesions. IMPRESSION: 1. Favor congestive heart failure. 2. Difficult to exclude pneumonia in the right lower lobe. Right pleural effusion appears partially loculated. 3. Enlarged mediastinal lymph nodes, likely reactive. 4.  Aortic atherosclerosis (ICD10-170.0). 5. Ascending aortic aneurysm NOS (ICD10-I71.9). Recommend annual imaging followup by CTA or MRA. This recommendation follows 2010 ACCF/AHA/AATS/ACR/ASA/SCA/SCAI/SIR/STS/SVM Guidelines for the Diagnosis and Management of Patients with Thoracic Aortic Disease. Circulation. 2010; 121: W102-V253. 6. Enlarged pulmonary arteries, indicative of pulmonary arterial hypertension. 7. Small perihepatic ascites. Electronically Signed   By: Lorin Picket M.D.   On: 05/16/2017 15:50   Dg Chest Port 1 View  Result Date: 05/16/2017 CLINICAL DATA:  Shortness of Breath EXAM: PORTABLE CHEST 1 VIEW COMPARISON:  May 14, 2017 FINDINGS: Right subclavian and right jugular catheter tips are in the superior vena cava. No pneumothorax. There is widespread airspace consolidation in both upper lobes, increased compared to recent study. There is patchy infiltrate in the left and right base regions which is stable. There is stable cardiomegaly with pulmonary venous hypertension. Pacemaker leads are attached the right atrium, right ventricle, and coronary sinus. 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 There is an atrial appendage clamp on the left. No adenopathy evident. No bone lesions. IMPRESSION: Increase in airspace opacity in the upper lobes bilaterally. Question increase in pulmonary edema versus increase in pneumonia. Both  entities may exist concurrently. Patchy infiltrate in the bases is stable. Stable cardiomegaly with pulmonary venous hypertension consistent with a degree of underlying pulmonary vascular congestion. No evident pneumothorax. Electronically Signed   By: Lowella Grip III M.D.   On: 05/16/2017 08:26   Medications: Infusions: . sodium chloride    . sodium chloride    . amiodarone 30 mg/hr (05/17/17 0700)  . cefTAZidime (FORTAZ)  IV Stopped (05/16/17 2128)  . DOBUTamine 2.5 mcg/kg/min (05/17/17 0700)  . heparin 1,550 Units/hr (05/17/17 0700)  . norepinephrine (LEVOPHED) Adult infusion 20 mcg/min (05/17/17 0700)  . dialysis replacement fluid (prismasate) 200 mL/hr at 05/17/17 0025  . dialysis replacement fluid (prismasate) 400 mL/hr at 05/16/17 0943  . dialysate (PRISMASATE) 1,500 mL/hr at 05/17/17 0730  . sodium chloride      Scheduled Medications: . allopurinol  100 mg Oral Daily  . aspirin EC  81 mg Oral Daily  . atorvastatin  40 mg  Oral q1800  . Chlorhexidine Gluconate Cloth  6 each Topical Daily  . GALACTIC Study - Placebo / omecamtiv mecarbil (PI-[place])  1 tablet Oral BID  . guaiFENesin  600 mg Oral BID  . insulin aspart  0-9 Units Subcutaneous TID WC  . mouth rinse  15 mL Mouth Rinse BID  . multivitamin with minerals  1 tablet Oral Daily  . sodium chloride flush  10-40 mL Intracatheter Q12H  . sodium chloride flush  3 mL Intravenous Q12H  . THROMBI-PAD  1 each Topical Once    have reviewed scheduled and prn medications.  Physical Exam: General: alert - looks better today  Heart: tachy Lungs: CBS bilat Abdomen: obese, soft Extremities: pitting edema Dialysis Access: right [place] cath- placed 12/1    05/17/2017,7:42 AM  LOS: 8 days

## 2017-05-17 NOTE — Progress Notes (Signed)
Patient ID: Dillon Cisneros, male   DOB: 1951/03/16, 66 y.o.   MRN: [id number]     Advanced Heart Failure Rounding Note  PCP: Meda Coffee Cardiologist: Aundra Dubin  Subjective:   11/27 S/P TEE-guided DCCV from flutter to NSR with improved BiV pacing.   11/27developed acute dyspnea and dizziness. Frequent PVCs. He was hypotensive. Amio increased to 60 mg per hour, norepi added, and dobutamine 2.5 mcg started with low co-ox.   He continued to diurese poorly despite inotrope support with rise in creatinine, CVP > 20. We attempted transition from dobutamine to milrinone but this did not affect UOP significantly.  Renal consulted 12/1 and CVVH started.  Lasix gtt and apixaban stopped and heparin gtt started.   Remains on CVVH with UF rate of 150 this am. Weight down this morning, net negative -2794 but only 80 cc urine. Co-ox 69% this am. CVP 13-14.    Currently on milrinone 0.25, amiodarone 30 mg/hr, norepinephrine 20.   He remains on ceftriaxone, PCT 1.68 05/14/2017 => 2.84 today.  Tm 100.3, WBCs 12.  CT chest 12/3 with pulmonary edema and possible RLL PNA.  He is now on ceftazidime. Cultures so far negative.   This morning, says breathing better but still short of breath with some tachypnea.    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  Objective:   Weight Range: 194 lb 7.1 oz (88.2 kg) Body mass index is 30.45 kg/m.   Vital Signs:   Temp:  [97.6 F (36.4 C)-100.3 F (37.9 C)] 99.7 F (37.6 C) (12/04 0400) Pulse Rate:  [68-138] 109 (12/04 0715) Resp:  [15-34] 18 (12/04 0715) BP: (66-133)/(45-89) 98/70 (12/04 0715) SpO2:  [91 %-100 %] 100 % (12/04 0715) Weight:  [194 lb 7.1 oz (88.2 kg)] 194 lb 7.1 oz (88.2 kg) (12/04 0530) Last BM Date: 05/16/17  Weight change: Filed Weights   05/15/17 0700 05/16/17 0600 05/17/17 0530  Weight: 216 lb  0.8 oz (98 kg) 213 lb 3 oz (96.7 kg) 194 lb 7.1 oz (88.2 kg)    Intake/Output:   Intake/Output Summary (Last 24 hours) at 05/17/2017 0740 Last data filed at 05/17/2017 0700 Gross per 24 hour  Intake 1810.53 ml  Output 4605 ml  Net -2794.47 ml    Physical Exam  CVP 13-14  General: mild tachypnea Neck: JVP 14 cm, no thyromegaly or thyroid nodule.  Lungs: Decreased breath sounds right base, some crackles CV: Nondisplaced PMI.  Heart mildly tachy, regular S1/S2, no S3/S4, no murmur.  1+ ankle edema.   Abdomen: Soft, nontender, no hepatosplenomegaly, no distention.  Skin: Intact without lesions or rashes.  Neurologic: Alert and oriented x 3.  Psych: Normal affect. Extremities: No clubbing or cyanosis.  HEENT: Normal.    Telemetry   BiV pacing in 110s, appears to be underlying NSR still (personally reviewed).    Labs    CBC Recent Labs    05/16/17 0451 05/17/17 0401  WBC 12.2* 12.2*  HGB 11.3* 11.2*  HCT 34.0* 34.8*  MCV 98.0 98.6  PLT 116* 196*   Basic Metabolic Panel Recent Labs    05/16/17 0451 05/16/17 1650 05/17/17 0401  NA 133* 132* 131*  K 4.5 4.4 4.6  CL 100* 98* 95*  CO2 26 26 26   GLUCOSE 167* 131* 128*  BUN 34* 33* 28*  CREATININE 2.01* 2.12* 1.86*  CALCIUM 8.2* 8.3* 8.3*  MG 2.4  --  2.6*  PHOS 3.1 2.6 2.7   Liver Function Tests Recent Labs    05/16/17 1650 05/17/17 0401  ALBUMIN 3.1* 2.8*   No results for input(s): LIPASE, AMYLASE in the last 72 hours. Cardiac Enzymes No results for input(s): CKTOTAL, CKMB, CKMBINDEX, TROPONINI in the last 72 hours.  BNP: BNP (last 3 results) Recent Labs    05/11/17 0357 05/12/17 0410 05/13/17 0444  BNP 1,412.5* 1,720.5* 2,177.6*    ProBNP (last 3 results) No results for input(s): PROBNP in the last 8760 hours.   D-Dimer No results for input(s): DDIMER in the last 72 hours. Hemoglobin A1C No results for input(s): HGBA1C in the last 72 hours. Fasting Lipid Panel No results for input(s):  CHOL, HDL, LDLCALC, TRIG, CHOLHDL, LDLDIRECT in the last 72 hours. Thyroid Function Tests No results for input(s): TSH, T4TOTAL, T3FREE, THYROIDAB in the last 72 hours.  Invalid input(s): FREET3  Other results:   Imaging    Ct Chest Wo Contrast  Result Date: 05/16/2017 CLINICAL DATA:  Increase shortness of breath with weakness. Abnormal chest radiograph. EXAM: CT CHEST WITHOUT CONTRAST TECHNIQUE: Multidetector CT imaging of the chest was performed following the standard protocol without IV contrast. COMPARISON:  Chest radiograph 05/16/2017. FINDINGS: Cardiovascular: Right IJ central line terminates in the SVC. Atherosclerotic calcification of the arterial vasculature. Ascending aorta measures 4.2 cm. Pulmonary arteries and heart are enlarged. No pericardial effusion. Mediastinum/Nodes: Mediastinal lymph nodes measure up to 1.6 cm in the low right paratracheal station. Hilar regions are difficult to evaluate without IV contrast. No axillary adenopathy. Esophagus is grossly unremarkable. Lungs/Pleura: There is patchy bilateral ground-glass and septal thickening with collapse/ consolidation in the right lower lobe and compressive atelectasis in the left lower lobe. Partially loculated moderate right pleural effusion. Small left pleural effusion. Scattered calcified granulomas. Airway is unremarkable. Upper Abdomen: Visualized portions of the liver, spleen and stomach are grossly unremarkable. Small perihepatic ascites. Musculoskeletal: Degenerative changes in the spine. No worrisome lytic or sclerotic lesions. IMPRESSION: 1. Favor congestive heart failure. 2. Difficult to exclude pneumonia in the right lower lobe. Right pleural effusion appears partially loculated. 3. Enlarged mediastinal lymph nodes, likely reactive. 4.  Aortic atherosclerosis (ICD10-170.0). 5. Ascending aortic aneurysm NOS (ICD10-I71.9). Recommend annual imaging followup by CTA or MRA. This recommendation follows 2010  ACCF/AHA/AATS/ACR/ASA/SCA/SCAI/SIR/STS/SVM Guidelines for the Diagnosis and Management of Patients with Thoracic Aortic Disease. Circulation. 2010; 121: P102-H852. 6. Enlarged pulmonary arteries, indicative of pulmonary arterial hypertension. 7. Small perihepatic ascites. Electronically Signed   By: Lorin Picket M.D.   On: 05/16/2017 15:50   Dg Chest Port 1 View  Result Date: 05/16/2017 CLINICAL DATA:  Shortness of Breath EXAM: PORTABLE CHEST 1 VIEW COMPARISON:  May 14, 2017 FINDINGS: Right subclavian and right jugular catheter tips are in the superior vena cava. No pneumothorax. There is widespread airspace consolidation in both upper lobes, increased compared to recent study. There is patchy infiltrate in the left and right base regions which is stable. There is stable cardiomegaly with pulmonary venous hypertension. Pacemaker leads are attached the right atrium, right ventricle, and coronary sinus. 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 There is an atrial appendage clamp on the left. No adenopathy evident. No bone lesions. IMPRESSION: Increase in airspace opacity in the upper lobes bilaterally. Question increase in pulmonary edema versus increase in pneumonia. Both entities may exist concurrently. Patchy infiltrate in the bases is stable. Stable cardiomegaly with pulmonary venous hypertension consistent with a degree of underlying pulmonary vascular congestion. No evident pneumothorax. Electronically Signed  By: Lowella Grip III M.D.   On: 05/16/2017 08:26     Medications:     Scheduled Medications: . allopurinol  100 mg Oral Daily  . aspirin EC  81 mg Oral Daily  . atorvastatin  40 mg Oral q1800  . Chlorhexidine Gluconate Cloth  6 each Topical Daily  . GALACTIC Study - Placebo / omecamtiv mecarbil (PI-[facility])  1 tablet Oral BID  . guaiFENesin  600 mg Oral BID  . insulin aspart  0-9 Units Subcutaneous TID WC  . mouth rinse  15 mL Mouth Rinse BID  . multivitamin with minerals  1  tablet Oral Daily  . sodium chloride flush  10-40 mL Intracatheter Q12H  . sodium chloride flush  3 mL Intravenous Q12H  . THROMBI-PAD  1 each Topical Once    Infusions: . sodium chloride    . sodium chloride    . amiodarone 30 mg/hr (05/17/17 0700)  . cefTAZidime (FORTAZ)  IV Stopped (05/16/17 2128)  . DOBUTamine 2.5 mcg/kg/min (05/17/17 0700)  . heparin 1,550 Units/hr (05/17/17 0700)  . norepinephrine (LEVOPHED) Adult infusion 20 mcg/min (05/17/17 0700)  . dialysis replacement fluid (prismasate) 200 mL/hr at 05/17/17 0025  . dialysis replacement fluid (prismasate) 400 mL/hr at 05/16/17 0943  . dialysate (PRISMASATE) 1,500 mL/hr at 05/17/17 0730  . sodium chloride      PRN Medications: Place/Maintain arterial line **AND** sodium chloride, acetaminophen, alteplase, heparin, nitroGLYCERIN, ondansetron (ZOFRAN) IV, sodium chloride, sodium chloride flush, sodium chloride flush    Patient Profile   66 yo with history of CAD, aortic and mitral valve disease, ischemic cardiomyopathy, and CKD stage 3 was admitted with acute on chronic systolic CHF, ?PNA, AKI on CKD, and atrial flutter.   Assessment/Plan   1. Shock: Possible mixed septic/cardiogenic. Started on norepi and dobutamine => transitioned to milrinone with ongoing poor UOP, now back to dobutamine with renal failure on CVVH. SBP 90s today on norepinephrine 20 + dobutamine 2.5. CO-ox 69%.  2. Acute on chronic systolic CHF: Ischemic cardiomyopathy.  EF 20-25% on 6/18 echo.  [employer] CRT-D device.  He remains volume overloaded but improving with CVVH. CVP 13-14, pulling 150 cc/hr UF. Co-ox 69% suggests good cardiac output.  - Continue current milrinone/norepinephrine.  - Continue UF 150 cc/hr today, may be able to cut back tomorrow.   - He is off Entresto, spironolactone, and Coreg.   - He remains on [employer] study drug.  - At baseline, percentage of BiV pacing is below goal.  Likely due to mode switching with baseline sinus tachy  and faulty atrial lead.  Will need atrial lead re-worked if he recovers from current episode. EP following.  3. CAD: S/p CABG.  No s/s of ischemia. TnI mildly elevated with no trend, likely due to demand ischemia/volume overload.   - Continue ASA 81.  - Continue atorvastatin 40 daily.  4. Bioprosthetic aortic valve: Stable on last echo.  5. S/p mitral valve repair: Stable on last echo.  6. AKI on CKD stage 3: Remains on CVVH. Only 80 cc UOP yesterday. Will plan UF again today to get him near euvolemia (would like to see CVP near 9-10 range), then see if his kidneys recover.  7. Atrial flutter: New for him, with RVR.  Has triggered CHF and likely AKI.  S/P TEE-guided DC-CV.  Remains in NSR with BiV pacing today. + PVCs.  - Continue amio drip at 30 mg/hr.  - Eliquis stopped and now on heparin gtt with CVVH.  8.  ID: Suspect RLL PNA on CT chest 12/3.  PCT 2.84 on 12/3.  Cultures NGTD.  Tm 100.3.  - He is now on ceftazidime.  9. Frequent PVCs/NSVT:  - Continue amio drip.  10. Thrombocytopenia: Mild, appears to have pre-dated heparin gtt.  May be due to sepsis.   Length of Stay: 8  CRITICAL CARE Performed by: Loralie Champagne  Total critical care time: 35 minutes  Critical care time was exclusive of separately billable procedures and treating other patients.  Critical care was necessary to treat or prevent imminent or life-threatening deterioration.  Critical care was time spent personally by me on the following activities: development of treatment plan with patient and/or surrogate as well as nursing, discussions with consultants, evaluation of patient's response to treatment, examination of patient, obtaining history from patient or surrogate, ordering and performing treatments and interventions, ordering and review of laboratory studies, ordering and review of radiographic studies, pulse oximetry and re-evaluation of patient's condition.  Loralie Champagne 05/17/2017 7:40 AM

## 2017-05-17 NOTE — Progress Notes (Signed)
PT Cancellation Note  Patient Details Name: Dillon Cisneros MRN: [id number] DOB: Apr 30, 1951   Cancelled Treatment:    Reason Eval/Treat Not Completed: Medical issues which prohibited therapy. Per MD not stable for PT at this time.   [place] 05/17/2017, 10:21 AM Suanne Marker PT [phone]

## 2017-05-17 NOTE — Progress Notes (Signed)
CPT not completed at this time as pt just ambulated to chair, will initiate later when back in bed. RN aware

## 2017-05-17 NOTE — Progress Notes (Signed)
Patient's standing weight 88.2 kg, spoke with PA Tillery regarding dobutamine order and patient's weight.  PA Tillery advised no adjustment to be made per patient's weight at this time.

## 2017-05-17 NOTE — Progress Notes (Signed)
PULMONARY / CRITICAL CARE MEDICINE   Name: Tabitha Tupper MRN: [id number] DOB: [birth year]    ADMISSION DATE:  05/09/2017 CONSULTATION DATE:  12/3  REFERRING MD:  Loralie Champagne, MD  CHIEF COMPLAINT:  Worsening infiltrates, hypoxia  HISTORY OF PRESENT ILLNESS:   66 yr old ischemic cardiomyopathy, pacer, admitted end of November for new flutter in seting of CKD known. Flutter was treated and heart failure seemed to worsen. Pt renal function wosened and required cvvhd. On 11/26 pcxr did show some atx/infiltrate rt base. On 11/27 pcxr showed effusion likely on rt base. No resp distress during this time. milrinone was used. He was also treated empirically for CAP, with azithro, CTX. No sig sputum, mild temp 100. WBC slight up but had neg balance at same time on cvvhd. Despite above and neg 3 liters, pcxr today worsening and called to assess. He remains still without distress.  SUBJECTIVE:  No acute events.  On dobutamine, levophed, amio, CRRT (2900 negative)  VITAL SIGNS: BP (!) 100/55   Pulse (!) 114   Temp 98.3 F (36.8 C) (Oral)   Resp (!) 22   Ht 5\' 7"  (1.702 m)   Wt 88.2 kg (194 lb 7.1 oz)   SpO2 95%   BMI 30.45 kg/m   HEMODYNAMICS: CVP:  [12 mmHg-16 mmHg] 14 mmHg  VENTILATOR SETTINGS:    INTAKE / OUTPUT: I/O last 3 completed shifts: In: 2741.9 [P.O.:365; I.V.:2276.9; IV Piggyback:100] Out: 8341 [Urine:260; DQQIW:9798; Stool:1]  PHYSICAL EXAMINATION: General:  Adult male, no distress Neuro: A&O x 3, no deficits HEENT:  [place] / AT, MMM Cardiovascular:  RRR Lungs:   Crackles bilateral, slightly diminished in bases R > L.  + egophany right base Abdomen:  Soft, BS low, abd wall edema Musculoskeletal:  No joint swelling Skin:  Intact, warm, dry  LABS:  BMET Recent Labs  Lab 05/16/17 0451 05/16/17 1650 05/17/17 0401  NA 133* 132* 131*  K 4.5 4.4 4.6  CL 100* 98* 95*  CO2 26 26 26   BUN 34* 33* 28*  CREATININE 2.01* 2.12* 1.86*  GLUCOSE 167* 131* 128*     Electrolytes Recent Labs  Lab 05/15/17 0459  05/16/17 0451 05/16/17 1650 05/17/17 0401  CALCIUM 8.5*   < > 8.2* 8.3* 8.3*  MG 2.5*  --  2.4  --  2.6*  PHOS 3.3   < > 3.1 2.6 2.7   < > = values in this interval not displayed.    CBC Recent Labs  Lab 05/15/17 0459 05/16/17 0451 05/17/17 0401  WBC 10.1 12.2* 12.2*  HGB 11.4* 11.3* 11.2*  HCT 34.7* 34.0* 34.8*  PLT 123* 116* 114*    Coag's Recent Labs  Lab 05/15/17 2346 05/16/17 0922 05/17/17 0401  APTT 56* 82* 55*    Sepsis Markers Recent Labs  Lab 05/14/17 1134 05/16/17 0922  PROCALCITON 1.68 2.84    ABG No results for input(s): PHART, PCO2ART, PO2ART in the last 168 hours.  Liver Enzymes Recent Labs  Lab 05/12/17 0410 05/13/17 0444 05/14/17 0530  05/16/17 0451 05/16/17 1650 05/17/17 0401  AST 22 19 33  --   --   --   --   ALT 36 28 37  --   --   --   --   ALKPHOS 58 62 167*  --   --   --   --   BILITOT 0.6 0.8 0.6  --   --   --   --   ALBUMIN 3.5 3.3* 3.0*   < >  2.7* 3.1* 2.8*   < > = values in this interval not displayed.    Cardiac Enzymes No results for input(s): TROPONINI, PROBNP in the last 168 hours.  Glucose Recent Labs  Lab 05/16/17 1236 05/16/17 1656 05/16/17 2158 05/17/17 0846  GLUCAP 122* 126* 139* 117*    Imaging Ct Chest Wo Contrast  Result Date: 05/16/2017 CLINICAL DATA:  Increase shortness of breath with weakness. Abnormal chest radiograph. EXAM: CT CHEST WITHOUT CONTRAST TECHNIQUE: Multidetector CT imaging of the chest was performed following the standard protocol without IV contrast. COMPARISON:  Chest radiograph 05/16/2017. FINDINGS: Cardiovascular: Right IJ central line terminates in the SVC. Atherosclerotic calcification of the arterial vasculature. Ascending aorta measures 4.2 cm. Pulmonary arteries and heart are enlarged. No pericardial effusion. Mediastinum/Nodes: Mediastinal lymph nodes measure up to 1.6 cm in the low right paratracheal station. Hilar regions  are difficult to evaluate without IV contrast. No axillary adenopathy. Esophagus is grossly unremarkable. Lungs/Pleura: There is patchy bilateral ground-glass and septal thickening with collapse/ consolidation in the right lower lobe and compressive atelectasis in the left lower lobe. Partially loculated moderate right pleural effusion. Small left pleural effusion. Scattered calcified granulomas. Airway is unremarkable. Upper Abdomen: Visualized portions of the liver, spleen and stomach are grossly unremarkable. Small perihepatic ascites. Musculoskeletal: Degenerative changes in the spine. No worrisome lytic or sclerotic lesions. IMPRESSION: 1. Favor congestive heart failure. 2. Difficult to exclude pneumonia in the right lower lobe. Right pleural effusion appears partially loculated. 3. Enlarged mediastinal lymph nodes, likely reactive. 4.  Aortic atherosclerosis (ICD10-170.0). 5. Ascending aortic aneurysm NOS (ICD10-I71.9). Recommend annual imaging followup by CTA or MRA. This recommendation follows 2010 ACCF/AHA/AATS/ACR/ASA/SCA/SCAI/SIR/STS/SVM Guidelines for the Diagnosis and Management of Patients with Thoracic Aortic Disease. Circulation. 2010; 121: M841-L244. 6. Enlarged pulmonary arteries, indicative of pulmonary arterial hypertension. 7. Small perihepatic ascites. Electronically Signed   By: Lorin Picket M.D.   On: 05/16/2017 15:50     STUDIES:  CT chest 12/3 > CHF, possible PNA in RLL, right effusion partially loculated, reactive mediastinal lymph nodes, small perihepatic ascites.  CULTURES: BC 12/3>>> UA SPUTUM 12/3>>>  ANTIBIOTICS: ceftaz 12/3>>> Azithro 11/27>>>12/1 11/27 >>>12/3  SIGNIFICANT EVENTS: 12/1 cvvhd started  LINES/TUBES: 12/1 left sub clav >   ASSESSMENT / PLAN:  PULMONARY A: Bilateral infiltrates, favor pulm edema / effusions but on CT chest can not rule out component PNA given infiltrate with air bronchograms, etc.  Also some component atelectasis with  loculated effusion on right Rule out PNA - PCT 2.8, has not produced any sputum Acute hypoxic respiratory failure - due to above P:   Continue volume removal per CRRT, goal neg balance Continue empiric ceftaz Follow cultures Might consider thora vs CVTS consult for loculated effusion, favor CVTS Continue supplemental O2 as needed to maintain SpO2 > 92%  CARDIOVASCULAR A:  Cardiogenic shock - possibly some component septic as well Acute on chronic sCHF A.flutter - s/p TEE guided DC-CV ? Adrenal insufficiency - cortisol pending Hx CAD P:  Advanced heart failure following Continue dobutamine, levophed Follow co-ox, CVP Volume removal per CRRT Continue amio, heparin, ASA, atorvastatin Assess cortisol.  Start empiric stress roids in the meantime, d/c if cortisol > 20  RENAL A:   Acute on chronic renal failure Mild hyponatremia - hypervolemic P:   CRRT per renal, Maintain same neg goals Follow BMP  GASTROINTESTINAL A:   Abdo wall edema P:   Continue volume removal per CRRT, goal neg balance  HEMATOLOGIC A:   DVT risk P:  Continue heparin gtt Follow CBC  INFECTIOUS A:   Rule out PNA P:   Continue empiric ceftaz Follow PCT (remember that about 15% of pct is cleared renally) - may see it flat on low end  ENDOCRINE A:   Mild hyperglycemia - at risk worsening with stress roids Rule out adrenal insuff P:   SSI if glucose consistently > 180 Assess cortisol.  Start empiric stress roids in the meantime, d/c if cortisol > 20  NEUROLOGIC A: No acute issues. P: No interventions required.   FAMILY  - Updates: Pt updated at bedside 12/4.  - Inter-disciplinary family meet or Palliative Care meeting due by: 12/3  Ccm time 30 min.   Montey Hora, [place] Pager: [phone]  or [phone] 05/17/2017, 9:41 AM   STAFF NOTE: I, Merrie Roof, MD FACP have personally reviewed patient's available data, including  medical history, events of note, physical examination and test results as part of my evaluation. I have discussed with resident/NP and other care providers such as pharmacist, RN and RRT. In addition, I personally evaluated patient and elicited key findings of: awake in bed, no overt distress, jvd remains, CVVHD on going, ronchi reduced rt base, coarse anterior, edema remains, CT chest I reviewed extensive shows pulm edema apical and mid lung fields, rt base to me looks like ATX , possible infiltrate with some compressive atx from effusion, was neg 2.7 liters on cvvhd, remains on levophed, titrate to MAp goal 60, dobutamine to remain as we pull off volume, the effusion to me is not something we should rush to thora or drain , it is small when u look at lung parenchyma vs effusio on med windows, not overtly impressed is loculated but more causing compressive atx on rt ll, risk benefit ratio does not support tap this now, effusion likely are about equal in size likely related to gross overload, maintain neg balance would follow pcxr in am ,add chest pt rt base, IS important, PCT not too impressive in setting of ARF, will treat 5-7 days for that appearance rt base on CT, in am will narrow if remains culture neg, he feesl better overall, mostly from cvvhd Im sure, maintain heparin IV, I feel the majorityu of the pcxr findings yesterday relate to volume, I updated pt in full The patient is critically ill with multiple organ systems failure and requires high complexity decision making for assessment and support, frequent evaluation and titration of therapies, application of advanced monitoring technologies and extensive interpretation of multiple databases.   Critical Care Time devoted to patient care services described in this note is30 Minutes. This time reflects time of care of this signee: Merrie Roof, MD FACP. This critical care time does not reflect procedure time, or teaching time or supervisory time of  PA/NP/Med student/Med Resident etc but could involve care discussion time. Rest per NP/medical resident whose note is outlined above and that I agree with   Lavon Paganini. Titus Mould, MD, [place] Pgr: [facility] 05/17/2017 10:14 AM

## 2017-05-17 NOTE — Progress Notes (Signed)
ANTICOAGULATION CONSULT NOTE - Follow-Up Consult  Pharmacy Consult for heparin > apixaban > heparin Indication: atrial fibrillation  Allergies  Allergen Reactions  . Penicillins Hives    Has patient had a PCN reaction causing immediate rash, facial/tongue/throat swelling, SOB or lightheadedness with hypotension: No Has patient had a PCN reaction causing severe rash involving mucus membranes or skin necrosis: No Has patient had a PCN reaction that required hospitalization: No Has patient had a PCN reaction occurring within the last 10 years: No If all of the above answers are "NO", then may proceed with Cephalosporin use.   . Nsaids     Should avoid    Patient Measurements: Height: 5\' 7"  (170.2 cm) Weight: 213 lb 3 oz (96.7 kg) IBW/kg (Calculated) : 66.1 Heparin Dosing Weight: 84.5kg  Vital Signs: Temp: 99.7 F (37.6 C) (12/04 0400) Temp Source: Axillary (12/04 0400) BP: 93/74 (12/04 0515) Pulse Rate: 111 (12/04 0515)  Labs: Recent Labs    05/15/17 0459  05/15/17 2346 05/16/17 0451 05/16/17 0922 05/16/17 1650 05/17/17 0401  HGB 11.4*  --   --  11.3*  --   --  11.2*  HCT 34.7*  --   --  34.0*  --   --  34.8*  PLT 123*  --   --  116*  --   --  114*  APTT 64*   < > 56*  --  82*  --  55*  HEPARINUNFRC >2.20*  --  1.90*  --   --   --  1.12*  CREATININE 2.36*   < >  --  2.01*  --  2.12* 1.86*   < > = values in this interval not displayed.    Estimated Creatinine Clearance: 42.1 mL/min (A) (by C-G formula based on SCr of 1.86 mg/dL (H)).   Medical History: Past Medical History:  Diagnosis Date  . Anemia   . Arthritis    arthritis  . Atrial fibrillation ([place]) 12/28/2015  . Basal cell carcinoma 2011   scalp/neck/forehead  . CAD (coronary artery disease) 06/21/1999  . 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 CHF (congestive heart failure) ([place]) 06/21/1999  . Chronic kidney disease 2010   stage 4 kidney failure  . Gout   .  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 Hyperlipidemia   . Hypertension 1998  . Thrombocytopenia ([place]) 2017  . Ventricular arrhythmia 2001   Assessment: 21 YOM transferred from [facility] with A-flutter. Underwent successful TEE-DCCV.  Patient was not on anticoagulation PTA.   Patient was previously on heparin drip for afib and transitioned to apixaban post DCCV.  Transitioned back to heparin as patient required CRRT for diuresis. Pharmacy to dose heparin while apixaban on hold, last dose 12/1.   Will use aPTT for heparin dosing given apixaban will falsely elevate heparin levels.    AM aPTT is subtherapeutic at 55s and no infusion issues per RN. CBC stable and no bleeding concerns per RN.   Noted patient appears sensitive to heparin rate changes.   Goal of Therapy:  APTT goal 66-102 sec Heparin level 0.3-0.7 units/ml Monitor platelets by anticoagulation protocol: Yes   Plan: Increase heparin gtt to 1550 units/hr Daily heparin level and CBC Monitor for s/s bleeding F/u ability to resume oral anticoagulation   Lavonda Jumbo, PharmD Clinical Pharmacist 05/17/17 5:33 AM

## 2017-05-17 NOTE — Progress Notes (Signed)
ANTICOAGULATION CONSULT NOTE - Follow-Up Consult  Pharmacy Consult for heparin > apixaban > heparin Indication: atrial fibrillation  Allergies  Allergen Reactions  . Penicillins Hives    Has patient had a PCN reaction causing immediate rash, facial/tongue/throat swelling, SOB or lightheadedness with hypotension: No Has patient had a PCN reaction causing severe rash involving mucus membranes or skin necrosis: No Has patient had a PCN reaction that required hospitalization: No Has patient had a PCN reaction occurring within the last 10 years: No If all of the above answers are "NO", then may proceed with Cephalosporin use.   . Nsaids     Should avoid    Patient Measurements: Height: 5\' 7"  (170.2 cm) Weight: 194 lb 7.1 oz (88.2 kg) IBW/kg (Calculated) : 66.1 Heparin Dosing Weight: 84.5kg  Vital Signs: Temp: 97.8 F (36.6 C) (12/04 1200) Temp Source: Oral (12/04 1200) BP: 87/70 (12/04 1405) Pulse Rate: 108 (12/04 1405)  Labs: Recent Labs    05/15/17 0459  05/15/17 2346 05/16/17 0451 05/16/17 0922 05/16/17 1650 05/17/17 0401 05/17/17 1257  HGB 11.4*  --   --  11.3*  --   --  11.2*  --   HCT 34.7*  --   --  34.0*  --   --  34.8*  --   PLT 123*  --   --  116*  --   --  114*  --   APTT 64*   < > 56*  --  82*  --  55* 72*  HEPARINUNFRC >2.20*  --  1.90*  --   --   --  1.12*  --   CREATININE 2.36*   < >  --  2.01*  --  2.12* 1.86*  --    < > = values in this interval not displayed.    Estimated Creatinine Clearance: 40.3 mL/min (A) (by C-G formula based on SCr of 1.86 mg/dL (H)).  Assessment: 85 YOM transferred from [facility] with A-flutter. Underwent successful TEE-DCCV.  Patient was not on anticoagulation PTA.   Patient was previously on heparin drip for afib and transitioned to apixaban post DCCV.  Transitioned back to heparin as patient required CRRT for diuresis. Pharmacy to dose heparin while apixaban on hold, last dose 12/1.   Will use aPTT for heparin dosing given  apixaban will falsely elevate heparin levels.     APTT this afternoon is therapeutic at 72 and no infusion issues per RN. CBC stable and no bleeding concerns per RN.   Goal of Therapy:  APTT goal 66-102 sec Heparin level 0.3-0.7 units/ml Monitor platelets by anticoagulation protocol: Yes   Plan: Increase heparin gtt to 1550 units/hr Daily heparin level and CBC Monitor for s/s bleeding F/u ability to resume oral anticoagulation   Uvaldo Rising, BCPS  Clinical Pharmacist Pager [phone]  05/17/2017 3:04 PM

## 2017-05-17 NOTE — Progress Notes (Addendum)
Called lab to check on cortisol result, spoke with Thayer Jew, was informed that lab machine had been out of use for maintenance and labs just now being ran which has delayed results.   Cortisol level 41.3.  Holding cortisol administrations until AM rounds when MD can review as lab level > 20 per conversation with PA Shearon Stalls during morning rounds on 12/4.

## 2017-05-18 ENCOUNTER — Inpatient Hospital Stay (HOSPITAL_COMMUNITY): Payer: Medicare Other

## 2017-05-18 DIAGNOSIS — J9600 Acute respiratory failure, unspecified whether with hypoxia or hypercapnia: Secondary | ICD-10-CM

## 2017-05-18 LAB — GLUCOSE, CAPILLARY: Glucose-Capillary: 100 mg/dL — ABNORMAL HIGH (ref 65–99)

## 2017-05-18 LAB — RENAL FUNCTION PANEL
ALBUMIN: 2.9 g/dL — AB (ref 3.5–5.0)
Albumin: 2.8 g/dL — ABNORMAL LOW (ref 3.5–5.0)
Anion gap: 13 (ref 5–15)
Anion gap: 8 (ref 5–15)
BUN: 26 mg/dL — ABNORMAL HIGH (ref 6–20)
BUN: 24 mg/dL — AB (ref 6–20)
CALCIUM: 8.9 mg/dL (ref 8.9–10.3)
CO2: 25 mmol/L (ref 22–32)
CO2: 27 mmol/L (ref 22–32)
CREATININE: 2.11 mg/dL — AB (ref 0.61–1.24)
Calcium: 8.8 mg/dL — ABNORMAL LOW (ref 8.9–10.3)
Chloride: 95 mmol/L — ABNORMAL LOW (ref 101–111)
Chloride: 98 mmol/L — ABNORMAL LOW (ref 101–111)
Creatinine, Ser: 1.93 mg/dL — ABNORMAL HIGH (ref 0.61–1.24)
GFR calc Af Amer: 39 mL/min — ABNORMAL LOW (ref >60)
GFR calc Af Amer: 35 mL/min — ABNORMAL LOW (ref >60)
GFR calc non Af Amer: 34 mL/min — ABNORMAL LOW (ref >60)
GFR, EST NON AFRICAN AMERICAN: 31 mL/min — AB (ref >60)
GLUCOSE: 123 mg/dL — AB (ref 65–99)
Glucose, Bld: 127 mg/dL — ABNORMAL HIGH (ref 65–99)
PHOSPHORUS: 2.3 mg/dL — AB (ref 2.5–4.6)
Phosphorus: 2.1 mg/dL — ABNORMAL LOW (ref 2.5–4.6)
Potassium: 4.2 mmol/L (ref 3.5–5.1)
Potassium: 4.4 mmol/L (ref 3.5–5.1)
SODIUM: 133 mmol/L — AB (ref 135–145)
Sodium: 133 mmol/L — ABNORMAL LOW (ref 135–145)

## 2017-05-18 LAB — COOXEMETRY PANEL
CARBOXYHEMOGLOBIN: 1.2 % (ref 0.5–1.5)
METHEMOGLOBIN: 1.1 % (ref 0.0–1.5)
O2 SAT: 60 %
TOTAL HEMOGLOBIN: 11.7 g/dL — AB (ref 12.0–16.0)

## 2017-05-18 LAB — CBC
HCT: 34.7 % — ABNORMAL LOW (ref 39.0–52.0)
Hemoglobin: 11.6 g/dL — ABNORMAL LOW (ref 13.0–17.0)
MCH: 32.8 pg (ref 26.0–34.0)
MCHC: 33.4 g/dL (ref 30.0–36.0)
MCV: 98 fL (ref 78.0–100.0)
Platelets: 130 10*3/uL — ABNORMAL LOW (ref 150–400)
RBC: 3.54 MIL/uL — ABNORMAL LOW (ref 4.22–5.81)
RDW: 16.6 % — ABNORMAL HIGH (ref 11.5–15.5)
WBC: 12.9 10*3/uL — ABNORMAL HIGH (ref 4.0–10.5)

## 2017-05-18 LAB — APTT
aPTT: 58 seconds — ABNORMAL HIGH (ref 24–36)
aPTT: 58 seconds — ABNORMAL HIGH (ref 24–36)

## 2017-05-18 LAB — MAGNESIUM: Magnesium: 2.6 mg/dL — ABNORMAL HIGH (ref 1.7–2.4)

## 2017-05-18 LAB — HEPARIN LEVEL (UNFRACTIONATED): Heparin Unfractionated: 1.01 IU/mL — ABNORMAL HIGH (ref 0.30–0.70)

## 2017-05-18 MED ORDER — HEPARIN (PORCINE) IN NACL 100-0.45 UNIT/ML-% IJ SOLN
1800.0000 [IU]/h | INTRAMUSCULAR | Status: DC
  Administered 2017-05-18: 1800 [IU]/h via INTRAVENOUS
  Administered 2017-05-18: 1650 [IU]/h via INTRAVENOUS
  Administered 2017-05-19 – 2017-05-20 (×2): 1800 [IU]/h via INTRAVENOUS
  Filled 2017-05-18 (×4): qty 250

## 2017-05-18 MED ORDER — GUAIFENESIN-DM 100-10 MG/5ML PO SYRP
5.0000 mL | ORAL_SOLUTION | ORAL | Status: DC | PRN
  Administered 2017-05-18: 5 mL via ORAL
  Filled 2017-05-18: qty 5

## 2017-05-18 MED ORDER — DEXTROSE 5 % IV SOLN
2.0000 g | INTRAVENOUS | Status: DC
  Administered 2017-05-18 – 2017-05-19 (×2): 2 g via INTRAVENOUS
  Filled 2017-05-18 (×2): qty 2

## 2017-05-18 NOTE — Progress Notes (Signed)
ANTICOAGULATION CONSULT NOTE - Follow-Up Consult  Pharmacy Consult for heparin > apixaban > heparin Indication: atrial fibrillation  Allergies  Allergen Reactions  . Penicillins Hives    Has patient had a PCN reaction causing immediate rash, facial/tongue/throat swelling, SOB or lightheadedness with hypotension: No Has patient had a PCN reaction causing severe rash involving mucus membranes or skin necrosis: No Has patient had a PCN reaction that required hospitalization: No Has patient had a PCN reaction occurring within the last 10 years: No If all of the above answers are "NO", then may proceed with Cephalosporin use.   . Nsaids     Should avoid    Patient Measurements: Height: 5\' 7"  (170.2 cm) Weight: 187 lb 9.8 oz (85.1 kg) IBW/kg (Calculated) : 66.1 Heparin Dosing Weight: 84.5kg  Vital Signs: Temp: 97.7 F (36.5 C) (12/05 0803) Temp Source: Oral (12/05 0803) BP: 87/59 (12/05 0803) Pulse Rate: 104 (12/05 0803)  Labs: Recent Labs    05/15/17 2346  05/16/17 0451  05/17/17 0401 05/17/17 1257 05/17/17 1755 05/18/17 0422  HGB  --    < > 11.3*  --  11.2*  --   --  11.6*  HCT  --   --  34.0*  --  34.8*  --   --  34.7*  PLT  --   --  116*  --  114*  --   --  130*  APTT 56*  --   --    < > 55* 72*  --  58*  HEPARINUNFRC 1.90*  --   --   --  1.12*  --   --  1.01*  CREATININE  --   --  2.01*   < > 1.86*  --  1.96* 1.93*   < > = values in this interval not displayed.    Estimated Creatinine Clearance: 38.2 mL/min (A) (by C-G formula based on SCr of 1.93 mg/dL (H)).  Assessment: 61 YOM transferred from [facility] with A-flutter. Underwent successful TEE-DCCV.  Patient was not on anticoagulation PTA.   Patient was previously on heparin drip for afib and transitioned to apixaban post DCCV.  Transitioned back to heparin as patient required CRRT for diuresis. Pharmacy to dose heparin while apixaban on hold, last dose 12/1.   Will use aPTT for heparin dosing given apixaban still  falsely elevating heparin levels.     APTT this morning is subtherapeutic at 58 and no infusion issues per RN. CBC stable and no bleeding concerns per RN.   Goal of Therapy:  APTT goal 66-102 sec Heparin level 0.3-0.7 units/ml Monitor platelets by anticoagulation protocol: Yes   Plan: Increase heparin gtt to 1650 units/hr Repeat PTT in 8 hrs. Daily heparin level, PTT and CBC Monitor for s/s bleeding F/u ability to resume oral anticoagulation   Uvaldo Rising, BCPS  Clinical Pharmacist Pager [phone]  05/18/2017 8:16 AM

## 2017-05-18 NOTE — Progress Notes (Signed)
PULMONARY / CRITICAL CARE MEDICINE   Name: Dillon Cisneros MRN: [id number] DOB: 1950/09/15    ADMISSION DATE:  05/09/2017 CONSULTATION DATE:  12/3  REFERRING MD:  Loralie Champagne, MD  CHIEF COMPLAINT:  Worsening infiltrates, hypoxia  HISTORY OF PRESENT ILLNESS:   66 yr old ischemic cardiomyopathy, pacer, admitted end of November for new flutter in seting of CKD known. Flutter was treated and heart failure seemed to worsen. Pt renal function wosened and required cvvhd. On 11/26 pcxr did show some atx/infiltrate rt base. On 11/27 pcxr showed effusion likely on rt base. No resp distress during this time. milrinone was used. He was also treated empirically for CAP, with azithro, CTX. No sig sputum, mild temp 100. WBC slight up but had neg balance at same time on cvvhd. Despite above and neg 3 liters, pcxr today worsening and called to assess. He remains still without distress.  SUBJECTIVE: remains in shock levo down to 7 cvvhd on going No fevers Neg baalnce  VITAL SIGNS: BP (!) 106/57   Pulse (!) 109   Temp 97.7 F (36.5 C) (Oral)   Resp (!) 24   Ht 5\' 7"  (1.702 m)   Wt 85.1 kg (187 lb 9.8 oz)   SpO2 95%   BMI 29.38 kg/m   HEMODYNAMICS: CVP:  [11 mmHg-14 mmHg] 11 mmHg  VENTILATOR SETTINGS:    INTAKE / OUTPUT: I/O last 3 completed shifts: In: 2969.3 [P.O.:890; I.V.:1929.3; IV Piggyback:150] Out: 4098 [Urine:170; Other:8754]  PHYSICAL EXAMINATION: General: in chair Neuro: nonfocal exam, perrl, oriented HEENT: jvd down PULM: clearing left, coughing, rt ronchi CV: s1 s2 paced no r GI: soft, Bs wnl, no r Extremities: edema non pit better  LABS:  BMET Recent Labs  Lab 05/17/17 0401 05/17/17 1755 05/18/17 0422  NA 131* 132* 133*  K 4.6 4.5 4.2  CL 95* 95* 95*  CO2 26 25 25   BUN 28* 24* 26*  CREATININE 1.86* 1.96* 1.93*  GLUCOSE 128* 145* 127*    Electrolytes Recent Labs  Lab 05/16/17 0451  05/17/17 0401 05/17/17 1755 05/18/17 0422  CALCIUM 8.2*   < > 8.3*  8.7* 8.8*  MG 2.4  --  2.6*  --  2.6*  PHOS 3.1   < > 2.7 2.4* 2.1*   < > = values in this interval not displayed.    CBC Recent Labs  Lab 05/16/17 0451 05/17/17 0401 05/18/17 0422  WBC 12.2* 12.2* 12.9*  HGB 11.3* 11.2* 11.6*  HCT 34.0* 34.8* 34.7*  PLT 116* 114* 130*    Coag's Recent Labs  Lab 05/17/17 0401 05/17/17 1257 05/18/17 0422  APTT 55* 72* 58*    Sepsis Markers Recent Labs  Lab 05/14/17 1134 05/16/17 0922  PROCALCITON 1.68 2.84    ABG No results for input(s): PHART, PCO2ART, PO2ART in the last 168 hours.  Liver Enzymes Recent Labs  Lab 05/12/17 0410 05/13/17 0444 05/14/17 0530  05/17/17 0401 05/17/17 1755 05/18/17 0422  AST 22 19 33  --   --   --   --   ALT 36 28 37  --   --   --   --   ALKPHOS 58 62 167*  --   --   --   --   BILITOT 0.6 0.8 0.6  --   --   --   --   ALBUMIN 3.5 3.3* 3.0*   < > 2.8* 2.9* 2.8*   < > = values in this interval not displayed.    Cardiac Enzymes  No results for input(s): TROPONINI, PROBNP in the last 168 hours.  Glucose Recent Labs  Lab 05/16/17 2158 05/17/17 0846 05/17/17 1307 05/17/17 1800 05/17/17 2113 05/18/17 0828  GLUCAP 139* 117* 126* 134* 141* 100*    Imaging No results found.   STUDIES:  CT chest 12/3 > CHF, possible PNA in RLL, right effusion partially loculated, reactive mediastinal lymph nodes, small perihepatic ascites.  CULTURES: BC 12/3>>> UA SPUTUM 12/3>>>  ANTIBIOTICS: ceftaz 12/3>>>12/5 Ctx 12/5>>>plan stop 12/11 Azithro 11/27>>>12/1 11/27 >>>12/3  SIGNIFICANT EVENTS: 12/1 cvvhd started, shock  LINES/TUBES: 12/1 left sub clav >   ASSESSMENT / PLAN:  PULMONARY A: Bilateral infiltrates, favor pulm edema / effusions but on CT chest can not rule out component PNA given infiltrate with air bronchograms, etc.  Also some component atelectasis with loculated effusion on right Rule out PNA - PCT 2.8, has not produced any sputum Acute hypoxic respiratory failure - due to  above P:   Maintain neg balance pcxr which I reviewed shows residual infiltrates bilateral and with this degree of neg balance ( 4 liters last 24 then 3 prior 24 ) I am convinced he required course ABX 7 days, see ID I will repeat pcxr further O2 suport Does not need NIMV   CARDIOVASCULAR A:  Cardiogenic shock - possibly some component septic as well Acute on chronic sCHF A.flutter - s/p TEE guided DC-CV ? Adrenal insufficiency - cortisol pending Hx CAD P:  Advanced heart failure following Continue dobutamine, levophed at 7, goals MAP 60 sys 90, hope can reduce further Volume removal per CRRT- doing well with this Continue amio, heparin, ASA, atorvastatin- maintain Assess cortisol 41, dc steroids   RENAL A:   Acute on chronic renal failure Mild hyponatremia - hypervolemic P:   CRRT tolerated 4 liters off Per renal with rate removal Glad to see levophed down also Chemi in am kvo  GASTROINTESTINAL A:   Abdo wall edema improved P:   Diet bm had 12/4  HEMATOLOGIC A:   DVT risk P:  Continue heparin gtt Follow CBC, plat is on rise with neg balance  INFECTIOUS A:   Rt PNA P:   Narrow to ctx, totoal duration from decline date 7 days Stop date added  ENDOCRINE A:   Mild hyperglycemia - at risk worsening with stress roids Rule out adrenal insuff - not seen P:   cbg wnl, check in am bmet Dc steroids  NEUROLOGIC A: No acute issues. P: mobilize   FAMILY  - Updates: Pt updated at bedside 12/4, 60 5  - Inter-disciplinary family meet or Palliative Care meeting due by: 12/3  Ccm time 30 min.  Lavon Paganini. Titus Mould, MD, [place] Pgr: [place] [facility] 05/18/2017 9:44 AM

## 2017-05-18 NOTE — Evaluation (Signed)
Physical Therapy Evaluation Patient Details Name: Dillon Cisneros MRN: [id number] DOB: Mar 09, 1951 Today's Date: 05/18/2017   History of Present Illness  Pt is a 66 y.o. male with ischemic cardiomyopathy with ICD in place and EF noted to be in the 20% range. He presented to the ED with c/o SOB. Pt admitted for pneumonia and cardiogenic shock.    Clinical Impression  Pt admitted with above diagnosis. Pt currently with functional limitations due to the deficits listed below (see PT Problem List). On eval, pt required min guard assist bed mobility and min assist sit to stand. Mobility limited to standing bedside with RN in room to assist due to pt on CRRT. Pt participated in LE exercises in recliner, reporting mod fatigue and mild SOB upon completion. Max HR 118 with VSS. Further information regarding home set up to be obtained next session. Pt will benefit from skilled PT to increase their independence and safety with mobility to allow discharge to the venue listed below.       Follow Up Recommendations Home health PT;Supervision/Assistance - 24 hour    Equipment Recommendations  None recommended by PT    Recommendations for Other Services       Precautions / Restrictions Precautions Precautions: Other (comment) Precaution Comments: CRRT Restrictions Weight Bearing Restrictions: No      Mobility  Bed Mobility Overal bed mobility: Needs Assistance Bed Mobility: Supine to Sit     Supine to sit: Min guard;HOB elevated     General bed mobility comments: +rail, min guard for safety due to multi lines  Transfers Overall transfer level: Needs assistance Equipment used: None Transfers: Sit to/from Stand Sit to Stand: Min assist         General transfer comment: Pt stood bedside with min assist. Bed removed and replaced with recliner for pt to sit. This method utilized due to pt on CRRT. RN in room assisting.   Ambulation/Gait             General Gait Details: unable to  progress due to CRRT. Pt demo good potential to progess well with ambulation once CRRT d/c'd.   Stairs            Wheelchair Mobility    Modified Rankin (Stroke Patients Only)       Balance Overall balance assessment: Needs assistance Sitting-balance support: No upper extremity supported;Feet supported Sitting balance-Leahy Scale: Good     Standing balance support: No upper extremity supported Standing balance-Leahy Scale: Fair Standing balance comment: static standing balance                             Pertinent Vitals/Pain Pain Assessment: Faces Faces Pain Scale: Hurts little more Pain Location: chest (from congestion/PNA) Pain Descriptors / Indicators: Pressure;Guarding;Grimacing Pain Intervention(s): Monitored during session;Repositioned    Home Living Family/patient expects to be discharged to:: Private residence Living Arrangements: Spouse/significant other Available Help at Discharge: Family;Available 24 hours/day Type of Home: House         Home Equipment: None      Prior Function Level of Independence: Independent               Hand Dominance        Extremity/Trunk Assessment                Communication   Communication: No difficulties  Cognition Arousal/Alertness: Awake/alert Behavior During Therapy: WFL for tasks assessed/performed Overall Cognitive Status: Within Functional Limits for tasks  assessed                                        General Comments      Exercises General Exercises - Lower Extremity Ankle Circles/Pumps: AROM;Both;15 reps Quad Sets: AROM;Both;10 reps Heel Slides: AROM;Left;10 reps Hip ABduction/ADduction: AROM;Both;10 reps   Assessment/Plan    PT Assessment Patient needs continued PT services  PT Problem List Decreased mobility;Decreased activity tolerance;Cardiopulmonary status limiting activity;Decreased balance       PT Treatment Interventions DME  instruction;Therapeutic activities;Gait training;Therapeutic exercise;Patient/family education;Stair training;Balance training;Functional mobility training    PT Goals (Current goals can be found in the Care Plan section)  Acute Rehab PT Goals Patient Stated Goal: get better PT Goal Formulation: With patient Time For Goal Achievement: 06/01/17 Potential to Achieve Goals: Good    Frequency Min 3X/week   Barriers to discharge        Co-evaluation               AM-PAC PT "6 Clicks" Daily Activity  Outcome Measure Difficulty turning over in bed (including adjusting bedclothes, sheets and blankets)?: A Little Difficulty moving from lying on back to sitting on the side of the bed? : A Little Difficulty sitting down on and standing up from a chair with arms (e.g., wheelchair, bedside commode, etc,.)?: Unable Help needed moving to and from a bed to chair (including a wheelchair)?: A Little Help needed walking in hospital room?: Total Help needed climbing 3-5 steps with a railing? : Total 6 Click Score: 12    End of Session Equipment Utilized During Treatment: Gait belt;Oxygen Activity Tolerance: Patient tolerated treatment well Patient left: in chair;with call bell/phone within reach;with nursing/sitter in room Nurse Communication: Mobility status PT Visit Diagnosis: Difficulty in walking, not elsewhere classified (R26.2);Muscle weakness (generalized) (M62.81)    Time: 1610-9604 PT Time Calculation (min) (ACUTE ONLY): 15 min   Charges:   PT Evaluation $PT Eval Moderate Complexity: 1 Mod     PT G Codes:        Lorrin Goodell, PT  Office # [phone] Pager [phone]   Lorriane Shire 05/18/2017, 9:36 AM

## 2017-05-18 NOTE — Progress Notes (Signed)
Subjective:  Remains unstable on pressors- being weaned down, dobutamine and CRRT -  4000 negative- now minimal UOP-  subjectively reports that he feels better still  Objective Vital signs in last 24 hours: Vitals:   05/18/17 0630 05/18/17 0645 05/18/17 0700 05/18/17 0715  BP: 95/63 92/67 (!) 86/62 (!) 86/63  Pulse: (!) 101 (!) 104 (!) 101 (!) 101  Resp: 15 16 15 14   Temp:      TempSrc:      SpO2: 99% 98% 99% 99%  Weight:      Height:       Weight change: -3.1 kg (-13.4 oz)  Intake/Output Summary (Last 24 hours) at 05/18/2017 0759 Last data filed at 05/18/2017 0703 Gross per 24 hour  Intake 2241.65 ml  Output 6307 ml  Net -4065.35 ml    Assessment/ Plan: Pt is a 66 y.o. yo male with ischemic CM who was admitted on 05/09/2017 with decompensated CHF- has CKD at baseline (1.7-2.1) has required CRRT  Assessment/Plan: 1. Renal- CKD at baseline (1.7-2.1) now with A on CRF in the setting of decompensated heart failure requiring CRRT- started on 12/1- cont for now-  Some UOP but is dropping- too soon to say if dialysis support will be needed long term-  But with pre CKD and poor heart function I would say is going to be difficult to maintain off of RRT- I have told him this 2. HTN/vol- overloaded- removed 4000 yest pressors coming down - CVP in the low teens- still with dep pitting edema- cont to remove volume as able- CXR on 12/3 showed worsening airspace dz and still SOB with inc O2 req  3. Anemia- not an issue right now  4. [place]- K is OK- giving phos orally - phos dropped but shortage of IV sodium phos right now- cont to watch   [place]: Basic Metabolic Panel: Recent Labs  Lab 05/17/17 0401 05/17/17 1755 05/18/17 0422  NA 131* 132* 133*  K 4.6 4.5 4.2  CL 95* 95* 95*  CO2 26 25 25   GLUCOSE 128* 145* 127*  BUN 28* 24* 26*  CREATININE 1.86* 1.96* 1.93*  CALCIUM 8.3* 8.7* 8.8*  PHOS 2.7 2.4* 2.1*   Liver Function Tests: Recent Labs  Lab  05/12/17 0410 05/13/17 0444 05/14/17 0530  05/17/17 0401 05/17/17 1755 05/18/17 0422  AST 22 19 33  --   --   --   --   ALT 36 28 37  --   --   --   --   ALKPHOS 58 62 167*  --   --   --   --   BILITOT 0.6 0.8 0.6  --   --   --   --   PROT 6.2* 5.9* 6.1*  --   --   --   --   ALBUMIN 3.5 3.3* 3.0*   < > 2.8* 2.9* 2.8*   < > = values in this interval not displayed.   No results for input(s): LIPASE, AMYLASE in the last 168 hours. No results for input(s): AMMONIA in the last 168 hours. CBC: Recent Labs  Lab 05/14/17 0530 05/15/17 0459 05/16/17 0451 05/17/17 0401 05/18/17 0422  WBC 9.9 10.1 12.2* 12.2* 12.9*  NEUTROABS 8.2*  --   --   --   --   HGB 11.5* 11.4* 11.3* 11.2* 11.6*  HCT 35.6* 34.7* 34.0* 34.8* 34.7*  MCV 97.0 97.7 98.0 98.6 98.0  PLT 110* 123* 116* 114* 130*  Cardiac Enzymes: No results for input(s): CKTOTAL, CKMB, CKMBINDEX, TROPONINI in the last 168 hours. CBG: Recent Labs  Lab 05/16/17 2158 05/17/17 0846 05/17/17 1307 05/17/17 1800 05/17/17 2113  GLUCAP 139* 117* 126* 134* 141*    Iron Studies: No results for input(s): IRON, TIBC, TRANSFERRIN, FERRITIN in the last 72 hours. Studies/Results: Ct Chest Wo Contrast  Result Date: 05/16/2017 CLINICAL DATA:  Increase shortness of breath with weakness. Abnormal chest radiograph. EXAM: CT CHEST WITHOUT CONTRAST TECHNIQUE: Multidetector CT imaging of the chest was performed following the standard protocol without IV contrast. COMPARISON:  Chest radiograph 05/16/2017. FINDINGS: Cardiovascular: Right IJ central line terminates in the SVC. Atherosclerotic calcification of the arterial vasculature. Ascending aorta measures 4.2 cm. Pulmonary arteries and heart are enlarged. No pericardial effusion. Mediastinum/Nodes: Mediastinal lymph nodes measure up to 1.6 cm in the low right paratracheal station. Hilar regions are difficult to evaluate without IV contrast. No axillary adenopathy. Esophagus is grossly unremarkable.  Lungs/Pleura: There is patchy bilateral ground-glass and septal thickening with collapse/ consolidation in the right lower lobe and compressive atelectasis in the left lower lobe. Partially loculated moderate right pleural effusion. Small left pleural effusion. Scattered calcified granulomas. Airway is unremarkable. Upper Abdomen: Visualized portions of the liver, spleen and stomach are grossly unremarkable. Small perihepatic ascites. Musculoskeletal: Degenerative changes in the spine. No worrisome lytic or sclerotic lesions. IMPRESSION: 1. Favor congestive heart failure. 2. Difficult to exclude pneumonia in the right lower lobe. Right pleural effusion appears partially loculated. 3. Enlarged mediastinal lymph nodes, likely reactive. 4.  Aortic atherosclerosis (ICD10-170.0). 5. Ascending aortic aneurysm NOS (ICD10-I71.9). Recommend annual imaging followup by CTA or MRA. This recommendation follows 2010 ACCF/AHA/AATS/ACR/ASA/SCA/SCAI/SIR/STS/SVM Guidelines for the Diagnosis and Management of Patients with Thoracic Aortic Disease. Circulation. 2010; 121: M767-M094. 6. Enlarged pulmonary arteries, indicative of pulmonary arterial hypertension. 7. Small perihepatic ascites. Electronically Signed   By: Lorin Picket M.D.   On: 05/16/2017 15:50   Dg Chest Port 1 View  Result Date: 05/16/2017 CLINICAL DATA:  Shortness of Breath EXAM: PORTABLE CHEST 1 VIEW COMPARISON:  May 14, 2017 FINDINGS: Right subclavian and right jugular catheter tips are in the superior vena cava. No pneumothorax. There is widespread airspace consolidation in both upper lobes, increased compared to recent study. There is patchy infiltrate in the left and right base regions which is stable. There is stable cardiomegaly with pulmonary venous hypertension. Pacemaker leads are attached the right atrium, right ventricle, and coronary sinus. 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 There is an atrial appendage clamp on the left. No adenopathy evident.  No bone lesions. IMPRESSION: Increase in airspace opacity in the upper lobes bilaterally. Question increase in pulmonary edema versus increase in pneumonia. Both entities may exist concurrently. Patchy infiltrate in the bases is stable. Stable cardiomegaly with pulmonary venous hypertension consistent with a degree of underlying pulmonary vascular congestion. No evident pneumothorax. Electronically Signed   By: Lowella Grip III M.D.   On: 05/16/2017 08:26   Medications: Infusions: . sodium chloride    . sodium chloride    . amiodarone 30 mg/hr (05/18/17 0700)  . cefTAZidime (FORTAZ)  IV Stopped (05/17/17 2129)  . DOBUTamine 2.5 mcg/kg/min (05/18/17 0700)  . heparin 1,650 Units/hr (05/18/17 0704)  . norepinephrine (LEVOPHED) Adult infusion 8 mcg/min (05/18/17 0700)  . dialysis replacement fluid (prismasate) 200 mL/hr at 05/18/17 0142  . dialysis replacement fluid (prismasate) 400 mL/hr at 05/18/17 0143  . dialysate (PRISMASATE) 1,500 mL/hr at 05/18/17 0602  . sodium chloride  Scheduled Medications: . allopurinol  100 mg Oral Daily  . aspirin EC  81 mg Oral Daily  . atorvastatin  40 mg Oral q1800  . Chlorhexidine Gluconate Cloth  6 each Topical Daily  . GALACTIC Study - Placebo / omecamtiv mecarbil ([facility])  1 tablet Oral BID  . guaiFENesin  600 mg Oral BID  . hydrocortisone sod succinate (SOLU-CORTEF) inj  50 mg Intravenous Q6H  . insulin aspart  0-9 Units Subcutaneous TID WC  . mouth rinse  15 mL Mouth Rinse BID  . multivitamin with minerals  1 tablet Oral Daily  . potassium & sodium phosphates  1 packet Oral Daily  . sodium chloride flush  10-40 mL Intracatheter Q12H  . sodium chloride flush  3 mL Intravenous Q12H  . THROMBI-PAD  1 each Topical Once    have reviewed scheduled and prn medications.  Physical Exam: General: alert - looks better today  Heart: tachy Lungs: CBS bilat Abdomen: obese, soft Extremities: pitting edema Dialysis Access: right [place] cath- placed  12/1    05/18/2017,7:59 AM  LOS: 9 days

## 2017-05-18 NOTE — Progress Notes (Signed)
ANTICOAGULATION CONSULT NOTE - Follow-Up Consult  Pharmacy Consult for heparin > apixaban > heparin Indication: atrial fibrillation  Allergies  Allergen Reactions  . Penicillins Hives    Has patient had a PCN reaction causing immediate rash, facial/tongue/throat swelling, SOB or lightheadedness with hypotension: No Has patient had a PCN reaction causing severe rash involving mucus membranes or skin necrosis: No Has patient had a PCN reaction that required hospitalization: No Has patient had a PCN reaction occurring within the last 10 years: No If all of the above answers are "NO", then may proceed with Cephalosporin use.   . Nsaids     Should avoid    Patient Measurements: Height: 5\' 7"  (170.2 cm) Weight: 187 lb 9.8 oz (85.1 kg) IBW/kg (Calculated) : 66.1 Heparin Dosing Weight: 84.5kg  Vital Signs: Temp: 97.6 F (36.4 C) (12/05 1545) Temp Source: Oral (12/05 1545) BP: 75/63 (12/05 1700) Pulse Rate: 103 (12/05 1700)  Labs: Recent Labs    05/15/17 2346  05/16/17 0451  05/17/17 0401 05/17/17 1257 05/17/17 1755 05/18/17 0422 05/18/17 1606  HGB  --    < > 11.3*  --  11.2*  --   --  11.6*  --   HCT  --   --  34.0*  --  34.8*  --   --  34.7*  --   PLT  --   --  116*  --  114*  --   --  130*  --   APTT 56*  --   --    < > 55* 72*  --  58* 58*  HEPARINUNFRC 1.90*  --   --   --  1.12*  --   --  1.01*  --   CREATININE  --   --  2.01*   < > 1.86*  --  1.96* 1.93* 2.11*   < > = values in this interval not displayed.    Estimated Creatinine Clearance: 34.9 mL/min (A) (by C-G formula based on SCr of 2.11 mg/dL (H)).  Assessment: 48 YOM transferred from [facility] with A-flutter. Underwent successful TEE-DCCV.  Patient was not on anticoagulation PTA.   Patient was previously on heparin drip for afib and transitioned to apixaban post DCCV.  Transitioned back to heparin as patient required CRRT for diuresis. Pharmacy to dose heparin while apixaban on hold, last dose 12/1.   Will use  aPTT for heparin dosing given apixaban still falsely elevating heparin levels.     APTT this afternoon is still low at  58s and no infusion issues per RN. CBC stable and no bleeding concerns per RN.   Goal of Therapy:  APTT goal 66-102 sec Heparin level 0.3-0.7 units/ml Monitor platelets by anticoagulation protocol: Yes   Plan: Increase heparin gtt to 1800 units/hr Repeat PTT in am Daily heparin level, PTT and CBC Monitor for s/s bleeding F/u ability to resume oral anticoagulation   Erin Hearing PharmD., BCPS Clinical Pharmacist Pager [phone] 05/18/2017 5:07 PM

## 2017-05-18 NOTE — Progress Notes (Signed)
Patient ID: Dillon Cisneros, male   DOB: 1950/08/06, 66 y.o.   MRN: [id number]     Advanced Heart Failure Rounding Note  PCP: Meda Coffee Cardiologist: Aundra Dubin  Subjective:   11/27 S/P TEE-guided DCCV from flutter to NSR with improved BiV pacing.   11/27developed acute dyspnea and dizziness. Frequent PVCs. He was hypotensive. Amio increased to 60 mg per hour, norepi added, and dobutamine 2.5 mcg started with low co-ox.   He continued to diurese poorly despite inotrope support with rise in creatinine, CVP > 20. We attempted transition from dobutamine to milrinone but this did not affect UOP significantly.  Renal consulted 12/1 and CVVH started.  Lasix gtt and apixaban stopped and heparin gtt started.   Remains on CVVH with UF rate of 150-190 negative overnight. Net negative 4.0 L and down 7 lbs.   CVP 11. Coox  60.0%.  Currently on dobutamine 2.5 mcg/kg/min, amiodarone 30 mg/hr, norepinephrine 8  He remains on ceftriaxone, PCT 1.68 05/14/2017 => 2.84 05/17/17.    Tmax 98.9. WBC 12.9.   CT chest 12/3 with pulmonary edema and possible RLL PNA.  He is now on ceftazidime. Cultures so far negative.   Feeling good this am. Norepi turned down. Only ~ 90-100 cc of UOP x 24 hrs.   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  Objective:   Weight Range: 187 lb 9.8 oz (85.1 kg) Body mass index is 29.38 kg/m.   Vital Signs:   Temp:  [97.5 F (36.4 C)-98.9 F (37.2 C)] 98.9 F (37.2 C) (12/05 0400) Pulse Rate:  [97-127] 101 (12/05 0715) Resp:  [13-28] 14 (12/05 0715) BP: (79-111)/(51-94) 86/63 (12/05 0715) SpO2:  [88 %-99 %] 99 % (12/05 0715) Weight:  [187 lb 9.8 oz (85.1 kg)] 187 lb 9.8 oz (85.1 kg) (12/05 0400) Last BM Date: (t)  Weight change: Filed Weights   05/16/17 0600 05/17/17 0530 05/18/17 0400  Weight: 213 lb 3 oz (96.7 kg) 194 lb  7.1 oz (88.2 kg) 187 lb 9.8 oz (85.1 kg)    Intake/Output:   Intake/Output Summary (Last 24 hours) at 05/18/2017 0756 Last data filed at 05/18/2017 0703 Gross per 24 hour  Intake 2241.65 ml  Output 6307 ml  Net -4065.35 ml    Physical Exam   CVP 11  General: NAD.  HEENT: Normal Neck: Supple. JVP ~10-11 cm . Carotids 2+ bilat; no bruits. No thyromegaly or nodule noted. Cor: PMI nondisplaced. RRR, No M/G/R noted Lungs: Diminished, mild basilar crackles. Abdomen: Soft, non-tender, non-distended, no HSM. No bruits or masses. +BS  Extremities: No cyanosis, clubbing, or rash. Trace to 1+ ankle edema.  Neuro: Alert & orientedx3, cranial nerves grossly intact. moves all 4 extremities w/o difficulty. Affect pleasant   Telemetry   BiV pacing in 100s, appears to be in NSR still, Personally reviewed.   Labs    CBC Recent Labs    05/17/17 0401 05/18/17 0422  WBC 12.2* 12.9*  HGB 11.2* 11.6*  HCT 34.8* 34.7*  MCV 98.6 98.0  PLT 114* 322*   Basic Metabolic Panel Recent Labs    05/17/17 0401 05/17/17 1755 05/18/17 0422  NA 131* 132* 133*  K 4.6 4.5 4.2  CL 95* 95* 95*  CO2 26 25 25   GLUCOSE 128* 145* 127*  BUN 28* 24* 26*  CREATININE 1.86* 1.96* 1.93*  CALCIUM 8.3* 8.7* 8.8*  MG 2.6*  --  2.6*  PHOS 2.7 2.4* 2.1*   Liver Function Tests Recent Labs    05/17/17 1755 05/18/17 0422  ALBUMIN 2.9* 2.8*   No results for input(s): LIPASE, AMYLASE in the last 72 hours. Cardiac Enzymes No results for input(s): CKTOTAL, CKMB, CKMBINDEX, TROPONINI in the last 72 hours.  BNP: BNP (last 3 results) Recent Labs    05/11/17 0357 05/12/17 0410 05/13/17 0444  BNP 1,412.5* 1,720.5* 2,177.6*    ProBNP (last 3 results) No results for input(s): PROBNP in the last 8760 hours.   D-Dimer No results for input(s): DDIMER in the last 72 hours. Hemoglobin A1C No results for input(s): HGBA1C in the last 72 hours. Fasting Lipid Panel No results for input(s): CHOL, HDL,  LDLCALC, TRIG, CHOLHDL, LDLDIRECT in the last 72 hours. Thyroid Function Tests No results for input(s): TSH, T4TOTAL, T3FREE, THYROIDAB in the last 72 hours.  Invalid input(s): FREET3  Other results:   Imaging    No results found.   Medications:     Scheduled Medications: . allopurinol  100 mg Oral Daily  . aspirin EC  81 mg Oral Daily  . atorvastatin  40 mg Oral q1800  . Chlorhexidine Gluconate Cloth  6 each Topical Daily  . GALACTIC Study - Placebo / omecamtiv mecarbil (PI-Sigrid Schwebach)  1 tablet Oral BID  . guaiFENesin  600 mg Oral BID  . hydrocortisone sod succinate (SOLU-CORTEF) inj  50 mg Intravenous Q6H  . insulin aspart  0-9 Units Subcutaneous TID WC  . mouth rinse  15 mL Mouth Rinse BID  . multivitamin with minerals  1 tablet Oral Daily  . potassium & sodium phosphates  1 packet Oral Daily  . sodium chloride flush  10-40 mL Intracatheter Q12H  . sodium chloride flush  3 mL Intravenous Q12H  . THROMBI-PAD  1 each Topical Once    Infusions: . sodium chloride    . sodium chloride    . amiodarone 30 mg/hr (05/18/17 0700)  . cefTAZidime (FORTAZ)  IV Stopped (05/17/17 2129)  . DOBUTamine 2.5 mcg/kg/min (05/18/17 0700)  . heparin 1,650 Units/hr (05/18/17 0704)  . norepinephrine (LEVOPHED) Adult infusion 8 mcg/min (05/18/17 0700)  . dialysis replacement fluid (prismasate) 200 mL/hr at 05/18/17 0142  . dialysis replacement fluid (prismasate) 400 mL/hr at 05/18/17 0143  . dialysate (PRISMASATE) 1,500 mL/hr at 05/18/17 0602  . sodium chloride      PRN Medications: Place/Maintain arterial line **AND** sodium chloride, acetaminophen, alteplase, heparin, nitroGLYCERIN, ondansetron (ZOFRAN) IV, sodium chloride, sodium chloride flush, sodium chloride flush    Patient Profile   66 yo with history of CAD, aortic and mitral valve disease, ischemic cardiomyopathy, and CKD stage 3 was admitted with acute on chronic systolic CHF, ?PNA, AKI on CKD, and atrial flutter.    Assessment/Plan   1. Shock: Possible mixed septic/cardiogenic. Started on norepi and dobutamine => transitioned to milrinone with ongoing poor UOP, now back to dobutamine with renal failure on CVVH.  - SBPs 80-90s today on  Norepinephrine 8 + dobutamine 2.5. CO-ox 60% 2. Acute on chronic systolic CHF: Ischemic cardiomyopathy.  EF 20-25% on 6/18 echo.  St Jude CRT-D device.  He remains volume overloaded but improving with CVVH.  - CVVH pulling 150-200 ml/hr. CVP 11. Coox 60% this am. Discussed with renal, will continue CVVHD at least today.  - Continue current dobutamine/norepinephrine.  - He is off Entresto, spironolactone, and Coreg.   - He remains on Galactic study drug.  - At baseline, percentage of BiV pacing is below  goal.  Likely due to mode switching with baseline sinus tachy and faulty atrial lead.  Will need atrial lead re-worked if he recovers from current episode. EP following.  3. CAD: S/p CABG.   - No s/s of ischemia.    - Tnl mildly elevated with no trend, likely due to demand ischemia/volume overload.   - Continue ASA 81.  - Continue atorvastatin 40 daily.  4. Bioprosthetic aortic valve: Stable on last echo.  5. S/p mitral valve repair: Stable on last echo.  6. AKI on CKD stage 3: Remains on CVVH. Only 80 cc UOP yesterday. Will plan UF again today to get him near euvolemia (would like to see CVP near 9-10 range), then see if his kidneys recover.  7. Atrial flutter: New for him, with RVR.  Has triggered CHF and likely AKI.  S/P TEE-guided DC-CV.  - Appears to remain in NSR with BiV pacing this am. + PVCs.  - Continue amio drip at 30 mg/hr.  - Eliquis stopped and now on heparin gtt with CVVH.  8. ID: Suspect RLL PNA on CT chest 12/3.  PCT 2.84 on 12/3.  Cultures NGTD.  Tmx 98.9 - Remains on ceftazidime.  9. Frequent PVCs/NSVT:  - Continue amio drip. No change.  10. Thrombocytopenia: -  Mild, appears to have pre-dated heparin gtt.  ? Due to sepsis.   Slowly improving.  Continue CVVHD for at least today.   Length of Stay: [address]  Annamaria Helling 05/18/2017 7:56 AM   Advanced Heart Failure Team Pager [phone] (M-F; 7a - 4p)  Please contact [facility] for night-coverage after hours (4p -7a ) and weekends on [email or web address]  Patient seen with PA, agree with the above note.  Breathing is gradually improving, still has cough.  He is in NSR this morning with PVCs.  CVP 11 this morning, co-ox 60%.  - Continue dobutamine with slow down-titration of norepinephrine.  - One more day CVVH, will likely stop tomorrow and see how he does on his own.  I am concerned given pre-admission CKD that he may end up being HD dependent. He had about 90 cc urine yesterday.  - Remains on abx for possible PNA, will complete empiric course of abx.  Cultures remain negative.   Loralie Champagne 05/18/2017 8:30 AM

## 2017-05-18 NOTE — Plan of Care (Signed)
  Progressing Education: Ability to demonstrate management of disease process will improve 05/18/2017 1747 - Progressing by Vicie Mutters, RN Activity: Capacity to carry out activities will improve 05/18/2017 1747 - Progressing by Vicie Mutters, RN Cardiac: Ability to achieve and maintain adequate cardiopulmonary perfusion will improve 05/18/2017 1747 - Progressing by Vicie Mutters, RN Health Behavior/Discharge Planning: Ability to manage health-related needs will improve 05/18/2017 1747 - Progressing by Vicie Mutters, RN Clinical Measurements: Ability to maintain clinical measurements within normal limits will improve 05/18/2017 1747 - Progressing by Vicie Mutters, RN Will remain free from infection 05/18/2017 1747 - Progressing by Vicie Mutters, RN Diagnostic test results will improve 05/18/2017 1747 - Progressing by Vicie Mutters, RN Respiratory complications will improve 05/18/2017 1747 - Progressing by Vicie Mutters, RN Cardiovascular complication will be avoided 05/18/2017 1747 - Progressing by Vicie Mutters, RN Activity: Risk for activity intolerance will decrease 05/18/2017 1747 - Progressing by Vicie Mutters, RN Nutrition: Adequate nutrition will be maintained 05/18/2017 1747 - Progressing by Vicie Mutters, RN Coping: Level of anxiety will decrease 05/18/2017 1747 - Progressing by Vicie Mutters, RN Elimination: Will not experience complications related to bowel motility 05/18/2017 1747 - Progressing by Vicie Mutters, RN Will not experience complications related to urinary retention 05/18/2017 1747 - Progressing by Vicie Mutters, RN Pain Managment: General experience of comfort will improve 05/18/2017 1747 - Progressing by Vicie Mutters, RN Skin Integrity: Risk for impaired skin integrity will decrease 05/18/2017 1747 - Progressing by Vicie Mutters, RN Activity: Ability to tolerate increased activity will  improve 05/18/2017 1747 - Progressing by Vicie Mutters, RN Respiratory: Ability to maintain adequate ventilation will improve 05/18/2017 1747 - Progressing by Vicie Mutters, RN Ability to maintain a clear airway will improve 05/18/2017 1747 - Progressing by Vicie Mutters, RN

## 2017-05-18 NOTE — Progress Notes (Signed)
Ezekiel Slocumb RN advised that during central line dressing change RN assessed that one of the two sutures were not intact.  Spoke with MD Titus Mould, MD assessed central line and advised no additional suture needed at this time.

## 2017-05-19 ENCOUNTER — Inpatient Hospital Stay (HOSPITAL_COMMUNITY): Payer: Medicare Other

## 2017-05-19 DIAGNOSIS — J9601 Acute respiratory failure with hypoxia: Secondary | ICD-10-CM

## 2017-05-19 DIAGNOSIS — G9340 Encephalopathy, unspecified: Secondary | ICD-10-CM

## 2017-05-19 LAB — POCT I-STAT 3, ART BLOOD GAS (G3+)
Acid-base deficit: 5 mmol/L — ABNORMAL HIGH (ref 0.0–2.0)
Acid-base deficit: 6 mmol/L — ABNORMAL HIGH (ref 0.0–2.0)
Acid-base deficit: 6 mmol/L — ABNORMAL HIGH (ref 0.0–2.0)
BICARBONATE: 26 mmol/L (ref 20.0–28.0)
Bicarbonate: 22.1 mmol/L (ref 20.0–28.0)
Bicarbonate: 22.2 mmol/L (ref 20.0–28.0)
Bicarbonate: 21.3 mmol/L (ref 20.0–28.0)
O2 SAT: 97 %
O2 Saturation: 100 %
O2 Saturation: 99 %
O2 Saturation: 95 %
PCO2 ART: 50.6 mmHg — AB (ref 32.0–48.0)
PCO2 ART: 53.8 mmHg — AB (ref 32.0–48.0)
PCO2 ART: 49.1 mmHg — AB (ref 32.0–48.0)
PH ART: 7.253 — AB (ref 7.350–7.450)
PH ART: 7.224 — AB (ref 7.350–7.450)
PH ART: 7.246 — AB (ref 7.350–7.450)
PO2 ART: 94 mmHg (ref 83.0–108.0)
Patient temperature: 100.3
Patient temperature: 99
Patient temperature: 98.6
TCO2: 23 mmol/L (ref 22–32)
TCO2: 24 mmol/L (ref 22–32)
TCO2: 24 mmol/L (ref 22–32)
TCO2: 27 mmol/L (ref 22–32)
pCO2 arterial: 44.8 mmHg (ref 32.0–48.0)
pH, Arterial: 7.371 (ref 7.350–7.450)
pO2, Arterial: 366 mmHg — ABNORMAL HIGH (ref 83.0–108.0)
pO2, Arterial: 151 mmHg — ABNORMAL HIGH (ref 83.0–108.0)
pO2, Arterial: 87 mmHg (ref 83.0–108.0)

## 2017-05-19 LAB — RENAL FUNCTION PANEL
ALBUMIN: 2.6 g/dL — AB (ref 3.5–5.0)
ANION GAP: 10 (ref 5–15)
Albumin: 2.7 g/dL — ABNORMAL LOW (ref 3.5–5.0)
Anion gap: 17 — ABNORMAL HIGH (ref 5–15)
BUN: 27 mg/dL — ABNORMAL HIGH (ref 6–20)
BUN: 23 mg/dL — AB (ref 6–20)
CHLORIDE: 96 mmol/L — AB (ref 101–111)
CHLORIDE: 95 mmol/L — AB (ref 101–111)
CO2: 24 mmol/L (ref 22–32)
CO2: 20 mmol/L — ABNORMAL LOW (ref 22–32)
CREATININE: 2.45 mg/dL — AB (ref 0.61–1.24)
Calcium: 8.4 mg/dL — ABNORMAL LOW (ref 8.9–10.3)
Calcium: 7.7 mg/dL — ABNORMAL LOW (ref 8.9–10.3)
Creatinine, Ser: 2.37 mg/dL — ABNORMAL HIGH (ref 0.61–1.24)
GFR calc Af Amer: 31 mL/min — ABNORMAL LOW (ref >60)
GFR calc Af Amer: 30 mL/min — ABNORMAL LOW (ref >60)
GFR calc non Af Amer: 27 mL/min — ABNORMAL LOW (ref >60)
GFR, EST NON AFRICAN AMERICAN: 25 mL/min — AB (ref >60)
Glucose, Bld: 120 mg/dL — ABNORMAL HIGH (ref 65–99)
Glucose, Bld: 120 mg/dL — ABNORMAL HIGH (ref 65–99)
PHOSPHORUS: 5.7 mg/dL — AB (ref 2.5–4.6)
POTASSIUM: 5.2 mmol/L — AB (ref 3.5–5.1)
Phosphorus: 2.5 mg/dL (ref 2.5–4.6)
Potassium: 5.7 mmol/L — ABNORMAL HIGH (ref 3.5–5.1)
Sodium: 130 mmol/L — ABNORMAL LOW (ref 135–145)
Sodium: 132 mmol/L — ABNORMAL LOW (ref 135–145)

## 2017-05-19 LAB — BLOOD GAS, ARTERIAL
ACID-BASE DEFICIT: 3.6 mmol/L — AB (ref 0.0–2.0)
Bicarbonate: 22.7 mmol/L (ref 20.0–28.0)
DRAWN BY: 313061
FIO2: 100
LHR: 14 {breaths}/min
O2 Saturation: 98.7 %
PEEP: 5 cmH2O
PH ART: 7.239 — AB (ref 7.350–7.450)
Patient temperature: 98.6
VT: 510 mL
pCO2 arterial: 55.1 mmHg — ABNORMAL HIGH (ref 32.0–48.0)
pO2, Arterial: 213 mmHg — ABNORMAL HIGH (ref 83.0–108.0)

## 2017-05-19 LAB — CBC
HCT: 35.1 % — ABNORMAL LOW (ref 39.0–52.0)
Hemoglobin: 11.5 g/dL — ABNORMAL LOW (ref 13.0–17.0)
MCH: 31.9 pg (ref 26.0–34.0)
MCHC: 32.8 g/dL (ref 30.0–36.0)
MCV: 97.2 fL (ref 78.0–100.0)
PLATELETS: 123 10*3/uL — AB (ref 150–400)
RBC: 3.61 MIL/uL — ABNORMAL LOW (ref 4.22–5.81)
RDW: 16.5 % — ABNORMAL HIGH (ref 11.5–15.5)
WBC: 11.6 10*3/uL — ABNORMAL HIGH (ref 4.0–10.5)

## 2017-05-19 LAB — COOXEMETRY PANEL
Carboxyhemoglobin: 1.4 % (ref 0.5–1.5)
Methemoglobin: 0.9 % (ref 0.0–1.5)
O2 Saturation: 60.7 %
TOTAL HEMOGLOBIN: 11.9 g/dL — AB (ref 12.0–16.0)

## 2017-05-19 LAB — LACTIC ACID, PLASMA
LACTIC ACID, VENOUS: 7.5 mmol/L — AB (ref 0.5–1.9)
Lactic Acid, Venous: 2.2 mmol/L (ref 0.5–1.9)

## 2017-05-19 LAB — PROCALCITONIN: PROCALCITONIN: 3.96 ng/mL

## 2017-05-19 LAB — MAGNESIUM: Magnesium: 2.4 mg/dL (ref 1.7–2.4)

## 2017-05-19 LAB — APTT: aPTT: 81 seconds — ABNORMAL HIGH (ref 24–36)

## 2017-05-19 LAB — HEPARIN LEVEL (UNFRACTIONATED): Heparin Unfractionated: 0.81 IU/mL — ABNORMAL HIGH (ref 0.30–0.70)

## 2017-05-19 MED ORDER — SODIUM BICARBONATE 8.4 % IV SOLN
INTRAVENOUS | Status: AC
  Administered 2017-05-19: 50 meq
  Filled 2017-05-19: qty 50

## 2017-05-19 MED ORDER — VANCOMYCIN HCL 10 G IV SOLR
1500.0000 mg | Freq: Once | INTRAVENOUS | Status: AC
  Administered 2017-05-19: 1500 mg via INTRAVENOUS
  Filled 2017-05-19: qty 1500

## 2017-05-19 MED ORDER — STERILE WATER FOR INJECTION IV SOLN
INTRAVENOUS | Status: DC
  Administered 2017-05-19 – 2017-05-20 (×9): via INTRAVENOUS_CENTRAL
  Filled 2017-05-19 (×21): qty 150

## 2017-05-19 MED ORDER — EPINEPHRINE PF 1 MG/ML IJ SOLN
0.5000 ug/min | INTRAVENOUS | Status: DC
  Administered 2017-05-19: 20 ug/min via INTRAVENOUS
  Administered 2017-05-19: 0.5 ug/min via INTRAVENOUS
  Administered 2017-05-19: 20 ug/min via INTRAVENOUS
  Administered 2017-05-19: 0.5 ug/min via INTRAVENOUS
  Administered 2017-05-20 (×3): 20 ug/min via INTRAVENOUS
  Filled 2017-05-19 (×8): qty 4

## 2017-05-19 MED ORDER — SODIUM CHLORIDE 0.9 % IV SOLN
0.0000 ug/min | INTRAVENOUS | Status: DC
  Administered 2017-05-19 (×5): 400 ug/min via INTRAVENOUS
  Filled 2017-05-19 (×5): qty 4

## 2017-05-19 MED ORDER — FENTANYL CITRATE (PF) 100 MCG/2ML IJ SOLN: 50.0000 ug | Freq: Once | INTRAMUSCULAR | Status: AC

## 2017-05-19 MED ORDER — SODIUM CHLORIDE 0.9 % IV SOLN
0.0000 ug/min | INTRAVENOUS | Status: DC
  Administered 2017-05-19 – 2017-05-20 (×4): 400 ug/min via INTRAVENOUS
  Filled 2017-05-19 (×5): qty 8

## 2017-05-19 MED ORDER — VANCOMYCIN HCL IN DEXTROSE 1-5 GM/200ML-% IV SOLN
1000.0000 mg | INTRAVENOUS | Status: DC
  Administered 2017-05-20: 1000 mg via INTRAVENOUS
  Filled 2017-05-19: qty 200

## 2017-05-19 MED ORDER — FENTANYL 2500MCG IN NS 250ML (10MCG/ML) PREMIX INFUSION
25.0000 ug/h | INTRAVENOUS | Status: DC
  Administered 2017-05-19: 25 ug/h via INTRAVENOUS
  Administered 2017-05-20: 75 ug/h via INTRAVENOUS
  Filled 2017-05-19 (×2): qty 250

## 2017-05-19 MED ORDER — SODIUM BICARBONATE 8.4 % IV SOLN
INTRAVENOUS | Status: DC
  Filled 2017-05-19: qty 150

## 2017-05-19 MED ORDER — SODIUM CHLORIDE 0.9 % IV SOLN
INTRAVENOUS | Status: DC | PRN
  Administered 2017-05-20: 04:00:00 via INTRA_ARTERIAL

## 2017-05-19 MED ORDER — STERILE WATER FOR INJECTION IV SOLN
INTRAVENOUS | Status: DC
  Administered 2017-05-19 – 2017-05-20 (×11): via INTRAVENOUS_CENTRAL
  Filled 2017-05-19 (×31): qty 150

## 2017-05-19 MED ORDER — FENTANYL BOLUS VIA INFUSION
25.0000 ug | INTRAVENOUS | Status: DC | PRN
  Administered 2017-05-19: 25 ug via INTRAVENOUS
  Filled 2017-05-19: qty 25

## 2017-05-19 MED ORDER — FENTANYL CITRATE (PF) 100 MCG/2ML IJ SOLN
INTRAMUSCULAR | Status: AC
  Administered 2017-05-19: 100 ug
  Filled 2017-05-19: qty 2

## 2017-05-19 MED ORDER — SODIUM CHLORIDE 0.9 % IV SOLN
0.0000 ug/min | INTRAVENOUS | Status: DC
  Administered 2017-05-19: 20 ug/min via INTRAVENOUS
  Filled 2017-05-19: qty 1

## 2017-05-19 MED ORDER — CHLORHEXIDINE GLUCONATE 0.12% ORAL RINSE (MEDLINE KIT)
15.0000 mL | Freq: Two times a day (BID) | OROMUCOSAL | Status: DC
  Administered 2017-05-19 – 2017-05-20 (×3): 15 mL via OROMUCOSAL

## 2017-05-19 MED ORDER — MIDAZOLAM HCL 2 MG/2ML IJ SOLN
INTRAMUSCULAR | Status: AC
  Administered 2017-05-19: 2 mg
  Filled 2017-05-19: qty 2

## 2017-05-19 MED ORDER — MIDAZOLAM HCL 2 MG/2ML IJ SOLN
1.0000 mg | INTRAMUSCULAR | Status: DC | PRN
  Administered 2017-05-19 (×2): 2 mg via INTRAVENOUS
  Filled 2017-05-19 (×2): qty 2

## 2017-05-19 MED ORDER — ORAL CARE MOUTH RINSE
15.0000 mL | OROMUCOSAL | Status: DC
  Administered 2017-05-19 – 2017-05-20 (×10): 15 mL via OROMUCOSAL

## 2017-05-19 MED ORDER — VASOPRESSIN 20 UNIT/ML IV SOLN
0.0300 [IU]/min | INTRAVENOUS | Status: DC
  Administered 2017-05-19 – 2017-05-20 (×2): 0.03 [IU]/min via INTRAVENOUS
  Filled 2017-05-19 (×3): qty 2

## 2017-05-19 NOTE — Progress Notes (Signed)
Unable to get MAP to 60 SBP in the 70's.  Oda Kilts PA notified and new orders received.  Wife at bedside and updated.  Will continue to monitor.

## 2017-05-19 NOTE — Progress Notes (Signed)
  Paged for patient decompensation, with pressures in 50-70s.   Discussed with Dr. Aundra Dubin and Dr. Haroldine Laws   Initially tried on Neosynephrine up to 300 mcg/min with no improvement in pressures.  Decision made to switch Neo to Epi.   ABG returned with Ph 7.2 , CO 53.8, O2 151, TCO2 24, Acid-base deficit 6.0  Given amp of bicarb with improvement in pressures. Discussed with Dr. Posey Pronto of Nephrology who instructed [place] not to start Bicarb drip, but he would adjust CRRT to replace bicarbonate.   All above discussed with MD.    Beryle Beams" [place], PA-C 05/19/2017 3:19 PM

## 2017-05-19 NOTE — Procedures (Signed)
Arterial Catheter Insertion Procedure Note Dillon Cisneros [id number] 1950/09/08  Procedure: Insertion of Arterial Catheter  Indications: Blood pressure monitoring and Frequent blood sampling  Procedure Details Consent: Risks of procedure as well as the alternatives and risks of each were explained to the (patient/caregiver).  Consent for procedure obtained.- Wife Time Out: Verified patient identification, verified procedure, site/side was marked, verified correct patient position, special equipment/implants available, medications/allergies/relevent history reviewed, required imaging and test results available.  Performed  Maximum sterile technique was used including antiseptics, cap, gloves, gown, hand hygiene, mask and sheet. Skin prep: Chlorhexidine; local anesthetic administered 20 gauge catheter was inserted into left radial artery using the Seldinger technique.  Evaluation Blood flow good; BP tracing good. Complications: No apparent complications.   Dede Query 05/19/2017

## 2017-05-19 NOTE — Progress Notes (Signed)
Patients SBP in the 50s despite maximum pressor support.  Notified Oda Kilts who is at bedside.  New orders received for 1 amp of bicarb push and Epi gtt.

## 2017-05-19 NOTE — Progress Notes (Signed)
PT Cancellation Note  Patient Details Name: Dillon Cisneros MRN: [id number] DOB: Sep 30, 1950   Cancelled Treatment:    Reason Eval/Treat Not Completed: Patient not medically ready. Pt intubated today. Will follow-up for PT treatment tomorrow.  Mabeline Caras, PT, DPT Acute Rehab Services  Pager: [place] 05/19/2017, 3:15 PM

## 2017-05-19 NOTE — Progress Notes (Signed)
  Paged for decompensation.   Pt HR up to 120s, and RR approaching 25-30 range. Pt just feels "bad"  + rigors and hot to touch on exam.   Temp 103.1 axillary and looks very fatigued.   CCM in room considering BiPAP vs Intubation with worsening resp status.   Legrand Como [address] [place], PA-C 05/19/2017 9:57 AM

## 2017-05-19 NOTE — Progress Notes (Signed)
Subjective:  Remains unstable on pressors- being weaned down, dobutamine and CRRT -  2900 negative- now minimal UOP-  subjectively reports worsening SOB  Objective Vital signs in last 24 hours: Vitals:   05/19/17 0400 05/19/17 0500 05/19/17 0600 05/19/17 0700  BP: (!) 81/71 (!) 89/57 90/61 (!) 74/64  Pulse: (!) 114 (!) 114 (!) 116 (!) 114  Resp: (!) 22 18 (!) 22 (!) 34  Temp: 97.9 F (36.6 C)     TempSrc: Oral     SpO2: 100% 98% 98% 99%  Weight: 84.1 kg (185 lb 6.5 oz)     Height:       Weight change: -1 kg (-3.3 oz)  Intake/Output Summary (Last 24 hours) at 05/19/2017 0743 Last data filed at 05/19/2017 0700 Gross per 24 hour  Intake 3136.81 ml  Output 6063 ml  Net -2926.19 ml    Assessment/ Plan: Pt is a 66 y.o. yo male with ischemic CM who was admitted on 05/09/2017 with decompensated CHF- has CKD at baseline (1.7-2.1) has required CRRT  Assessment/Plan: 1. Renal- CKD at baseline (1.7-2.1) now with A on CRF in the setting of decompensated heart failure requiring CRRT- started on 12/1- cont for now-  Some UOP but is dropping- too soon to say if dialysis support will be needed long term-  But with pre CKD and poor heart function I would say is going to be difficult to maintain off of RRT- I have told him this.  Bun and crt rising on CRT- increase rates of fluids  2. HTN/vol- overloaded- removed 2900 yest pressors coming down - CVP in the low teens- still with dep pitting edema- I thought would be hitting bottom soon but do not seem to be cont to remove volume as able- CXR on 12/5 showed improvement in airspace dz and still SOB with inc O2 req  3. Anemia- not an issue right now  4. [place]- K is higher- giving phos orally - phos better-  shortage of IV sodium phos right now- with potassium going up will stop oral kphos   Bellarae Lizer A    Labs: Basic Metabolic Panel: Recent Labs  Lab 05/18/17 0422 05/18/17 1606 05/19/17 0330  NA 133* 133* 130*  K 4.2 4.4 5.2*  CL 95*  98* 96*  CO2 25 27 24   GLUCOSE 127* 123* 120*  BUN 26* 24* 27*  CREATININE 1.93* 2.11* 2.37*  CALCIUM 8.8* 8.9 8.4*  PHOS 2.1* 2.3* 2.5   Liver Function Tests: Recent Labs  Lab 05/13/17 0444 05/14/17 0530  05/18/17 0422 05/18/17 1606 05/19/17 0330  AST 19 33  --   --   --   --   ALT 28 37  --   --   --   --   ALKPHOS 62 167*  --   --   --   --   BILITOT 0.8 0.6  --   --   --   --   PROT 5.9* 6.1*  --   --   --   --   ALBUMIN 3.3* 3.0*   < > 2.8* 2.9* 2.7*   < > = values in this interval not displayed.   No results for input(s): LIPASE, AMYLASE in the last 168 hours. No results for input(s): AMMONIA in the last 168 hours. CBC: Recent Labs  Lab 05/14/17 0530 05/15/17 0459 05/16/17 0451 05/17/17 0401 05/18/17 0422 05/19/17 0330  WBC 9.9 10.1 12.2* 12.2* 12.9* 11.6*  NEUTROABS 8.2*  --   --   --   --   --  HGB 11.5* 11.4* 11.3* 11.2* 11.6* 11.5*  HCT 35.6* 34.7* 34.0* 34.8* 34.7* 35.1*  MCV 97.0 97.7 98.0 98.6 98.0 97.2  PLT 110* 123* 116* 114* 130* 123*   Cardiac Enzymes: No results for input(s): CKTOTAL, CKMB, CKMBINDEX, TROPONINI in the last 168 hours. CBG: Recent Labs  Lab 05/17/17 0846 05/17/17 1307 05/17/17 1800 05/17/17 2113 05/18/17 0828  GLUCAP 117* 126* 134* 141* 100*    Iron Studies: No results for input(s): IRON, TIBC, TRANSFERRIN, FERRITIN in the last 72 hours. Studies/Results: Dg Chest Port 1 View  Result Date: 05/18/2017 CLINICAL DATA:  Dialysis patient.  Shortness of breath and cough. EXAM: PORTABLE CHEST 1 VIEW COMPARISON:  05/16/2017 FINDINGS: Chronic cardiomegaly. Pacemaker/AICD appears unchanged. Right internal jugular central line tip in the SVC above the right atrium. Right subclavian central line tip in the SVC at the azygos level. Slight decrease in pulmonary edema and better aeration at the lung bases. IMPRESSION: Radiographic improvement with slight decrease in edema and better aeration at the lung bases. Electronically Signed   By:  Nelson Chimes M.D.   On: 05/18/2017 09:52   Medications: Infusions: . amiodarone 30 mg/hr (05/19/17 0700)  . cefTRIAXone (ROCEPHIN)  IV Stopped (05/18/17 2002)  . DOBUTamine 2.5 mcg/kg/min (05/19/17 0700)  . heparin 1,800 Units/hr (05/19/17 0700)  . norepinephrine (LEVOPHED) Adult infusion 4.053 mcg/min (05/19/17 0700)  . dialysis replacement fluid (prismasate) 200 mL/hr at 05/19/17 0340  . dialysis replacement fluid (prismasate) 400 mL/hr at 05/19/17 0605  . dialysate (PRISMASATE) 1,500 mL/hr at 05/18/17 2243  . sodium chloride      Scheduled Medications: . allopurinol  100 mg Oral Daily  . aspirin EC  81 mg Oral Daily  . atorvastatin  40 mg Oral q1800  . Chlorhexidine Gluconate Cloth  6 each Topical Daily  . GALACTIC Study - Placebo / omecamtiv mecarbil (PI-[place])  1 tablet Oral BID  . guaiFENesin  600 mg Oral BID  . mouth rinse  15 mL Mouth Rinse BID  . multivitamin with minerals  1 tablet Oral Daily  . potassium & sodium phosphates  1 packet Oral Daily  . sodium chloride flush  10-40 mL Intracatheter Q12H  . THROMBI-PAD  1 each Topical Once    have reviewed scheduled and prn medications.  Physical Exam: General: alert - looks worse today Heart: tachy Lungs: CBS bilat Abdomen: obese, soft Extremities: pitting edema Dialysis Access: right Justice cath- placed 12/1    05/19/2017,7:43 AM  LOS: 10 days

## 2017-05-19 NOTE — Progress Notes (Signed)
Now maxed on all pressors with EPI at 20 norepi at 20. Vasopressin.  Discussed with wife. She realizes he may not survive the night.   Will deactivate ICD. D/w ST Jude rep.   [name], MD  7:58 PM

## 2017-05-19 NOTE — Progress Notes (Signed)
Pt continues to have difficulty breathing and O2 sats dropping to 80% on 6L [place].  Richardson Landry Minor at bedside; paged Oda Kilts PA with HF team.  Temp 103.1.  Coming to bedside for assessment.  Will continue to monitor.

## 2017-05-19 NOTE — Progress Notes (Signed)
Notified Richardson Landry Minor re: concerns for patients increased work of breathing.  Pt shivering uncontrollably at this time pt is afebrile.  Assisted back to bed will continue to monitor.

## 2017-05-19 NOTE — Progress Notes (Signed)
Dr. Haroldine Laws notified of ABG and LA results as well as all pressors being maxed out.  Instructed to increase the levophed to 71mcg.  Will continue to monitor.

## 2017-05-19 NOTE — Progress Notes (Signed)
   Patient with progressive hypotension and acidosis in setting of sepsis.   Now starting epinephrine. SBP in 70-80s. Was in 63s but improved with bicarb.  Remains intubated and critically ill appearing.   I discussed with his wife at bedside and CCM. Will continue current support. Titrate epi to 4 (can go up to 20 as needed).   Will make no CPR. Will leave ICD on for now. Dr. Aundra Dubin aware.   CRITICAL CARE Performed by: [name]  Total critical care time: 35 minutes  Critical care time was exclusive of separately billable procedures and treating other patients.  Critical care was necessary to treat or prevent imminent or life-threatening deterioration.  Critical care was time spent personally by me (independent of midlevel providers or residents) on the following activities: development of treatment plan with patient and/or surrogate as well as nursing, discussions with consultants, evaluation of patient's response to treatment, examination of patient, obtaining history from patient or surrogate, ordering and performing treatments and interventions, ordering and review of laboratory studies, ordering and review of radiographic studies, pulse oximetry and re-evaluation of patient's condition.  [name], MD  3:41 PM

## 2017-05-19 NOTE — Progress Notes (Signed)
Pharmacy Antibiotic Note  Dillon Cisneros is a 66 y.o. male being treated for pneumonia, currently on ceftriaxone, now with worsening respiratory status and fever to 103. Vancomycin will be added. He continues on CRRT.  Vancomycin trough 15-20  Plan: 1) Vancomycin 1500mg  IV x 1 then 1000mg  IV q24 2) Follow CRRT plan/tolerance, cultures, LOT, level if needed  Height: 5\' 7"  (170.2 cm) Weight: 185 lb 6.5 oz (84.1 kg) IBW/kg (Calculated) : 66.1  Temp (24hrs), Avg:98.3 F (36.8 C), Min:97.6 F (36.4 C), Max:100.3 F (37.9 C)  Recent Labs  Lab 05/15/17 0459  05/16/17 0451  05/17/17 0401 05/17/17 1755 05/18/17 0422 05/18/17 1606 05/19/17 0330  WBC 10.1  --  12.2*  --  12.2*  --  12.9*  --  11.6*  CREATININE 2.36*   < > 2.01*   < > 1.86* 1.96* 1.93* 2.11* 2.37*   < > = values in this interval not displayed.    Estimated Creatinine Clearance: 30.9 mL/min (A) (by C-G formula based on SCr of 2.37 mg/dL (H)).    Allergies  Allergen Reactions  . Penicillins Hives    Has patient had a PCN reaction causing immediate rash, facial/tongue/throat swelling, SOB or lightheadedness with hypotension: No Has patient had a PCN reaction causing severe rash involving mucus membranes or skin necrosis: No Has patient had a PCN reaction that required hospitalization: No Has patient had a PCN reaction occurring within the last 10 years: No If all of the above answers are "NO", then may proceed with Cephalosporin use.   . Nsaids     Should avoid    Antimicrobials this admission: Azithro 11/27 >> 12/1 Ceftriaxone 11/27 >> 12/3, resume 12/5 > (12/10) Ceftazidime 12/3>> 12/5 Vancomycin 12/6 >>  Dose adjustments this admission: n/a  Microbiology results: 12/6 Resp Cx: 12/6 BCx x2: 12/3 BCx x 2: ngtd 11/30 UCx: neg  11/26 Resp virus panel neg 11/27 MRSA PCR: neg  Thank you for allowing pharmacy to be a part of this patient's care.  Deboraha Sprang 05/19/2017 10:17 AM

## 2017-05-19 NOTE — Progress Notes (Signed)
Patient being intubated by Richardson Landry Minor NP and Dr. Nelda Marseille at the bedside.  Procedure explained to both the patient and his wife.  They verbalize understanding and agree to intubation.  Dr. Aundra Dubin  At bedside and aware of situation.  Will continue to monitor.

## 2017-05-19 NOTE — Progress Notes (Signed)
Pt uncontrollably shivering and has SOB at rest applied blanket warmer patient afebrile at this time, continuing to pull 150/hr on CRRT will continue to monitor.

## 2017-05-19 NOTE — Progress Notes (Signed)
Pt out of bed, in chair during CPT (via bed) order time.  Will resume CPT at next scheduled time.

## 2017-05-19 NOTE — Progress Notes (Signed)
ANTICOAGULATION CONSULT NOTE - Follow-Up Consult  Pharmacy Consult for heparin > apixaban > heparin Indication: atrial fibrillation  Allergies  Allergen Reactions  . Penicillins Hives    Has patient had a PCN reaction causing immediate rash, facial/tongue/throat swelling, SOB or lightheadedness with hypotension: No Has patient had a PCN reaction causing severe rash involving mucus membranes or skin necrosis: No Has patient had a PCN reaction that required hospitalization: No Has patient had a PCN reaction occurring within the last 10 years: No If all of the above answers are "NO", then may proceed with Cephalosporin use.   . Nsaids     Should avoid    Patient Measurements: Height: 5\' 7"  (170.2 cm) Weight: 185 lb 6.5 oz (84.1 kg) IBW/kg (Calculated) : 66.1 Heparin Dosing Weight: 84.5kg  Vital Signs: Temp: 97.6 F (36.4 C) (12/06 0800) Temp Source: Oral (12/06 0800) BP: 88/70 (12/06 0945) Pulse Rate: 119 (12/06 0930)  Labs: Recent Labs    05/17/17 0401  05/18/17 0422 05/18/17 1606 05/19/17 0330  HGB 11.2*  --  11.6*  --  11.5*  HCT 34.8*  --  34.7*  --  35.1*  PLT 114*  --  130*  --  123*  APTT 55*   < > 58* 58* 81*  HEPARINUNFRC 1.12*  --  1.01*  --  0.81*  CREATININE 1.86*   < > 1.93* 2.11* 2.37*   < > = values in this interval not displayed.    Estimated Creatinine Clearance: 30.9 mL/min (A) (by C-G formula based on SCr of 2.37 mg/dL (H)).  Medications: Heparin @ 1800 units/hr  Assessment: 66 YOM transferred from [facility] with A-flutter. Underwent successful TEE-DCCV.  Patient was not on anticoagulation PTA.   Patient was previously on heparin drip for afib and transitioned to apixaban post DCCV.  Transitioned back to heparin as patient required CRRT for diuresis. Pharmacy to dose heparin while apixaban on hold, last dose 12/1.   Will use APTT for heparin dosing given apixaban still falsely elevating heparin levels.    APTT this morning is therapeutic at 81  seconds. Heparin level remains elevated 0.81 but trending down. CBC stable. No bleeding.  Goal of Therapy:  APTT goal 66-102 sec Heparin level 0.3-0.7 units/ml Monitor platelets by anticoagulation protocol: Yes   Plan: 1) Continue heparin at 1800 units/hr 2) Daily heparin level, APTT and CBC  Nena Jordan, Pharm D, BCPS   05/19/2017 10:39 AM

## 2017-05-19 NOTE — Procedures (Signed)
Intubation Procedure Note Tayte Mcwherter [id number] 07-26-1950  Procedure: Intubation Indications: Respiratory insufficiency  Procedure Details Consent: Risks of procedure as well as the alternatives and risks of each were explained to the (patient/caregiver).  Consent for procedure obtained. Time Out: Verified patient identification, verified procedure, site/side was marked, verified correct patient position, special equipment/implants available, medications/allergies/relevent history reviewed, required imaging and test results available.  Performed  MAC and 4 Medications:  Fentanyl 100 mcg Etomidate 20 mg Versed 2 mg NMB 50 mg rocuronium    Evaluation Hemodynamic Status: Transient hypotension treated with pressors; O2 sats: stable throughout Patient's Current Condition: stable Complications: No apparent complications Patient did tolerate procedure well. Chest X-ray ordered to verify placement.  CXR: pending.   Richardson Landry Minor ACNP [facility] PCCM Pager [phone] till 3 pm If no answer page [phone] 05/19/2017, 10:29 AM  I was present and supervised the entire procedure.  Rush Farmer, M.D. [facility] Pulmonary/Critical Care Medicine. Pager: [phone]. After hours pager: [phone].

## 2017-05-19 NOTE — Research (Signed)
Research Encounter  Galactic-HF IP discontinued due to subject being intubated.  IP must be swallowed whole (not chewed, crushed, or split).  Will continue to follow.

## 2017-05-19 NOTE — Progress Notes (Signed)
CRITICAL VALUE ALERT  Critical Value:  LA  Date & Time Notied:  05/19/17 1435  Provider Notified: Salem Senate NP  Orders Received/Actions taken: new orders received and noted

## 2017-05-19 NOTE — Progress Notes (Signed)
Patient ID: Dillon Cisneros, male   DOB: Oct 22, 1950, 66 y.o.   MRN: [id number]     Advanced Heart Failure Rounding Note  PCP: Meda Coffee Cardiologist: Aundra Dubin  Subjective:   11/27 S/P TEE-guided DCCV from flutter to NSR with improved BiV pacing.   11/27developed acute dyspnea and dizziness. Frequent PVCs. He was hypotensive. Amio increased to 60 mg per hour, norepi added, and dobutamine 2.5 mcg started with low co-ox.   He continued to diurese poorly despite inotrope support with rise in creatinine, CVP > 20. We attempted transition from dobutamine to milrinone but this did not affect UOP significantly.  Renal consulted 12/1 and CVVH started.  Lasix gtt and apixaban stopped and heparin gtt started.   Remains on CVVH with UF rate of 150 this am. Weight down this morning, net negative -2926 but only 70 cc urine. Co-ox 61% this am. CVP 13-14 still.    Currently on dobutamine 2.5, amiodarone 30 mg/hr, norepinephrine 4.   He remains on ceftriaxone, PCT 1.68 05/14/2017 => 2.84 12/4.  Tm 100.3 last night, WBCs 11.  CT chest 12/3 with pulmonary edema and possible RLL PNA.  He is now on ceftazidime. Cultures so far negative.   Drinking a lot of fluid, still with dyspnea. HR higher this morning in 110s.   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  Objective:   Weight Range: 185 lb 6.5 oz (84.1 kg) Body mass index is 29.04 kg/m.   Vital Signs:   Temp:  [97.6 F (36.4 C)-100.3 F (37.9 C)] 97.9 F (36.6 C) (12/06 0400) Pulse Rate:  [97-118] 114 (12/06 0700) Resp:  [15-34] 34 (12/06 0700) BP: (71-125)/(43-109) 74/64 (12/06 0700) SpO2:  [92 %-100 %] 99 % (12/06 0700) Weight:  [185 lb 6.5 oz (84.1 kg)] 185 lb 6.5 oz (84.1 kg) (12/06 0400) Last BM Date: 05/18/17  Weight change: Filed Weights   05/17/17 0530 05/18/17 0400 05/19/17 0400  Weight:  194 lb 7.1 oz (88.2 kg) 187 lb 9.8 oz (85.1 kg) 185 lb 6.5 oz (84.1 kg)    Intake/Output:   Intake/Output Summary (Last 24 hours) at 05/19/2017 0731 Last data filed at 05/19/2017 0700 Gross per 24 hour  Intake 3136.81 ml  Output 6063 ml  Net -2926.19 ml    Physical Exam  CVP 13-14  General: NAD Neck: JVP 14+, no thyromegaly or thyroid nodule.  Lungs: Clear to auscultation bilaterally with normal respiratory effort. CV: Lateral PMI.  Heart tachy, regular S1/S2, no S3/S4, 1/6 SEM RUSB.  Trace ankle edema.   Abdomen: Soft, nontender, no hepatosplenomegaly, no distention.  Skin: Intact without lesions or rashes.  Neurologic: Alert and oriented x 3.  Psych: Normal affect. Extremities: No clubbing or cyanosis.  HEENT: Normal.    Telemetry   BiV pacing in 110s,?underlying rhythm (personally reviewed).    Labs    CBC Recent Labs    05/18/17 0422 05/19/17 0330  WBC 12.9* 11.6*  HGB 11.6* 11.5*  HCT 34.7* 35.1*  MCV 98.0 97.2  PLT 130* 924*   Basic Metabolic Panel Recent Labs    05/18/17 0422 05/18/17 1606 05/19/17 0330  NA 133* 133* 130*  K 4.2 4.4 5.2*  CL 95* 98* 96*  CO2 25 27 24   GLUCOSE 127* 123* 120*  BUN 26* 24* 27*  CREATININE 1.93* 2.11* 2.37*  CALCIUM 8.8* 8.9 8.4*  MG 2.6*  --  2.4  PHOS 2.1* 2.3* 2.5   Liver Function Tests Recent Labs    05/18/17 1606 05/19/17 0330  ALBUMIN 2.9* 2.7*   No results for input(s): LIPASE, AMYLASE in the last 72 hours. Cardiac Enzymes No results for input(s): CKTOTAL, CKMB, CKMBINDEX, TROPONINI in the last 72 hours.  BNP: BNP (last 3 results) Recent Labs    05/11/17 0357 05/12/17 0410 05/13/17 0444  BNP 1,412.5* 1,720.5* 2,177.6*    ProBNP (last 3 results) No results for input(s): PROBNP in the last 8760 hours.   D-Dimer No results for input(s): DDIMER in the last 72 hours. Hemoglobin A1C No results for input(s): HGBA1C in the last 72 hours. Fasting Lipid Panel No results for input(s): CHOL, HDL,  LDLCALC, TRIG, CHOLHDL, LDLDIRECT in the last 72 hours. Thyroid Function Tests No results for input(s): TSH, T4TOTAL, T3FREE, THYROIDAB in the last 72 hours.  Invalid input(s): FREET3  Other results:   Imaging    Dg Chest Port 1 View  Result Date: 05/18/2017 CLINICAL DATA:  Dialysis patient.  Shortness of breath and cough. EXAM: PORTABLE CHEST 1 VIEW COMPARISON:  05/16/2017 FINDINGS: Chronic cardiomegaly. Pacemaker/AICD appears unchanged. Right internal jugular central line tip in the SVC above the right atrium. Right subclavian central line tip in the SVC at the azygos level. Slight decrease in pulmonary edema and better aeration at the lung bases. IMPRESSION: Radiographic improvement with slight decrease in edema and better aeration at the lung bases. Electronically Signed   By: Nelson Chimes M.D.   On: 05/18/2017 09:52     Medications:     Scheduled Medications: . allopurinol  100 mg Oral Daily  . aspirin EC  81 mg Oral Daily  . atorvastatin  40 mg Oral q1800  . Chlorhexidine Gluconate Cloth  6 each Topical Daily  . GALACTIC Study - Placebo / omecamtiv mecarbil (PI-[employer] Rewerts)  1 tablet Oral BID  . guaiFENesin  600 mg Oral BID  . mouth rinse  15 mL Mouth Rinse BID  . multivitamin with minerals  1 tablet Oral Daily  . potassium & sodium phosphates  1 packet Oral Daily  . sodium chloride flush  10-40 mL Intracatheter Q12H  . THROMBI-PAD  1 each Topical Once    Infusions: . amiodarone 30 mg/hr (05/19/17 0700)  . cefTRIAXone (ROCEPHIN)  IV Stopped (05/18/17 2002)  . DOBUTamine 2.5 mcg/kg/min (05/19/17 0700)  . heparin 1,800 Units/hr (05/19/17 0700)  . norepinephrine (LEVOPHED) Adult infusion 4.053 mcg/min (05/19/17 0700)  . dialysis replacement fluid (prismasate) 200 mL/hr at 05/19/17 0340  . dialysis replacement fluid (prismasate) 400 mL/hr at 05/19/17 0605  . dialysate (PRISMASATE) 1,500 mL/hr at 05/18/17 2243  . sodium chloride      PRN Medications: acetaminophen,  alteplase, guaiFENesin-dextromethorphan, heparin, nitroGLYCERIN, ondansetron (ZOFRAN) IV, sodium chloride, sodium chloride flush    Patient Profile   66 yo with history of CAD, aortic and mitral valve disease, ischemic cardiomyopathy, and CKD stage 3 was admitted with acute on chronic systolic CHF, ?PNA, AKI on CKD, and atrial flutter.   Assessment/Plan   1. Shock: Possible mixed septic/cardiogenic. Started on norepi and dobutamine => transitioned to milrinone with ongoing poor UOP, now back to dobutamine with renal failure on CVVH. SBP 90s today on norepinephrine 4 + dobutamine 2.5. CO-ox 61%.  2. Acute on chronic systolic CHF: Ischemic cardiomyopathy.  EF 20-25% on 6/18 echo.  [employer] Jude CRT-D device.  He remains volume overloaded but improving with CVVH. CVP 13-14 still, pulling 150 cc/hr UF. Co-ox 61%  suggests good cardiac output.  - Fluid restrict.  - Continue current dobutamine 2.5/norepinephrine 4.  - Continue UF 150 cc/hr today, may be able to stop CVVH tomorrow but need to keep going today as still volume overloaded.   - He is off Entresto, spironolactone, and Coreg.   - He remains on Galactic study drug.  - At baseline, percentage of BiV pacing is below goal.  Likely due to mode switching with baseline sinus tachy and faulty atrial lead.  Will need atrial lead re-worked if he recovers from current episode. EP following.  3. CAD: S/p CABG.  No s/s of ischemia. TnI mildly elevated with no trend, likely due to demand ischemia/volume overload.   - Continue ASA 81.  - Continue atorvastatin 40 daily.  4. Bioprosthetic aortic valve: Stable on last echo.  5. S/p mitral valve repair: Stable on last echo.  6. AKI on CKD stage 3: Remains on CVVH. Only 70 cc UOP yesterday. Will plan UF again today to get him near euvolemia (would like to see CVP near 9-10 range), then see if his kidneys recover.  7. Atrial flutter: New for him, with RVR.  Has triggered CHF and likely AKI.  S/P TEE-guided DC-CV.   Rhythm today is difficult.  - I will have [employer] interrogate his device, ?flutter.  - Continue amio drip at 30 mg/hr.  - Eliquis stopped and now on heparin gtt with CVVH.  8. ID: Suspect RLL PNA on CT chest 12/3.  PCT 2.84 on 12/3.  Cultures NGTD.  Tm 100.3.  - He is now on ceftriaxone, CCM following.  9. Frequent PVCs/NSVT:  - Continue amio drip.  10. Thrombocytopenia: Mild, appears to have pre-dated heparin gtt.  May be due to sepsis.   Length of Stay: [place] Performed by: Loralie Champagne  Total critical care time: 35 minutes  Critical care time was exclusive of separately billable procedures and treating other patients.  Critical care was necessary to treat or prevent imminent or life-threatening deterioration.  Critical care was time spent personally by me on the following activities: development of treatment plan with patient and/or surrogate as well as nursing, discussions with consultants, evaluation of patient's response to treatment, examination of patient, obtaining history from patient or surrogate, ordering and performing treatments and interventions, ordering and review of laboratory studies, ordering and review of radiographic studies, pulse oximetry and re-evaluation of patient's condition.  Loralie Champagne 05/19/2017 7:31 AM

## 2017-05-19 NOTE — Progress Notes (Signed)
PULMONARY / CRITICAL CARE MEDICINE   Name: Dillon Cisneros MRN: [id number] DOB: 16-Jun-1950    ADMISSION DATE:  05/09/2017 CONSULTATION DATE:  12/3  REFERRING MD:  Loralie Champagne, MD  CHIEF COMPLAINT:  Worsening infiltrates, hypoxia  HISTORY OF PRESENT ILLNESS:   66 yr old ischemic cardiomyopathy, pacer, admitted end of November for new flutter in seting of CKD known. Flutter was treated and heart failure seemed to worsen. Pt renal function wosened and required cvvhd. On 11/26 pcxr did show some atx/infiltrate rt base. On 11/27 pcxr showed effusion likely on rt base. No resp distress during this time. milrinone was used. He was also treated empirically for CAP, with azithro, CTX. No sig sputum, mild temp 100. WBC slight up but had neg balance at same time on cvvhd. Despite above and neg 3 liters, pcxr today worsening and called to assess. He remains still without distress.  SUBJECTIVE:  Reports not feeling well today 05/19/2017.  Mild temp of 100.1 arm reports he just does not look well and he states he does not feel well.  Some concern that he is wearing out and may need to be intubated if he declines.  He has continued to decline and we will intubated today. VITAL SIGNS: BP (!) 80/56   Pulse (!) 117   Temp 97.6 F (36.4 C) (Oral)   Resp 18   Ht 5\' 7"  (1.702 m)   Wt 185 lb 6.5 oz (84.1 kg)   SpO2 96%   BMI 29.04 kg/m   HEMODYNAMICS: CVP:  [5 mmHg-20 mmHg] 20 mmHg  VENTILATOR SETTINGS:    INTAKE / OUTPUT: I/O last 3 completed shifts: In: 3792 [P.O.:1980; I.V.:1662; IV Piggyback:150] Out: 8697 [Urine:110; Other:8587]  PHYSICAL EXAMINATION: General: Frail appearing male currently sitting in chair.  Reports not feeling well.  Looks tired HEENT: No JVD appreciated PSY: Dull effect Neuro: Moves all extremities x4 to command CV: Heart sounds are regular AV paced at a rate of 100 PULM: Decreased breath sounds in bases bibasilar crackles.  Increased work of breathing JS:EGBT,  non-tender, bsx4 active  Extremities: warm/dry, 2+ edema  Skin: no rashes or lesions   LABS:  BMET Recent Labs  Lab 05/18/17 0422 05/18/17 1606 05/19/17 0330  NA 133* 133* 130*  K 4.2 4.4 5.2*  CL 95* 98* 96*  CO2 25 27 24   BUN 26* 24* 27*  CREATININE 1.93* 2.11* 2.37*  GLUCOSE 127* 123* 120*    Electrolytes Recent Labs  Lab 05/17/17 0401  05/18/17 0422 05/18/17 1606 05/19/17 0330  CALCIUM 8.3*   < > 8.8* 8.9 8.4*  MG 2.6*  --  2.6*  --  2.4  PHOS 2.7   < > 2.1* 2.3* 2.5   < > = values in this interval not displayed.    CBC Recent Labs  Lab 05/17/17 0401 05/18/17 0422 05/19/17 0330  WBC 12.2* 12.9* 11.6*  HGB 11.2* 11.6* 11.5*  HCT 34.8* 34.7* 35.1*  PLT 114* 130* 123*    Coag's Recent Labs  Lab 05/18/17 0422 05/18/17 1606 05/19/17 0330  APTT 58* 58* 81*    Sepsis Markers Recent Labs  Lab 05/14/17 1134 05/16/17 0922  PROCALCITON 1.68 2.84    ABG No results for input(s): PHART, PCO2ART, PO2ART in the last 168 hours.  Liver Enzymes Recent Labs  Lab 05/13/17 0444 05/14/17 0530  05/18/17 0422 05/18/17 1606 05/19/17 0330  AST 19 33  --   --   --   --   ALT 28 37  --   --   --   --  ALKPHOS 62 167*  --   --   --   --   BILITOT 0.8 0.6  --   --   --   --   ALBUMIN 3.3* 3.0*   < > 2.8* 2.9* 2.7*   < > = values in this interval not displayed.    Cardiac Enzymes No results for input(s): TROPONINI, PROBNP in the last 168 hours.  Glucose Recent Labs  Lab 05/16/17 2158 05/17/17 0846 05/17/17 1307 05/17/17 1800 05/17/17 2113 05/18/17 0828  GLUCAP 139* 117* 126* 134* 141* 100*    Imaging Dg Chest Port 1 View  Result Date: 05/19/2017 CLINICAL DATA:  Shortness of breath, respiratory failure EXAM: PORTABLE CHEST 1 VIEW COMPARISON:  Portable chest x-ray of 05/18/2017 FINDINGS: There is little change in patchy airspace disease bilaterally. Cardiomegaly stable and AICD leads remain. Right central venous lines are unchanged in position.  Median sternotomy sutures are noted. IMPRESSION: Little change in patchy airspace disease bilaterally with cardiomegaly. Electronically Signed   By: Ivar Drape M.D.   On: 05/19/2017 08:28     STUDIES:  CT chest 12/3 > CHF, possible PNA in RLL, right effusion partially loculated, reactive mediastinal lymph nodes, small perihepatic ascites.  CULTURES: BC 12/3>>> UA 11 32,018- SPUTUM 12/3>>> not done Blood cultures x2 05/19/2017>> Sputum culture 05/19/2017>>  ANTIBIOTICS: ceftaz 12/3>>>12/5 Ctx 12/5>>>plan stop 12/11 Azithro 11/27>>>12/1 05/19/2017 vancomycin>>   SIGNIFICANT EVENTS: 12/1 cvvhd started, shock 12/6 intubated for resp insuff.  LINES/TUBES: 12/1 left sub clav > 12/6 et >>   ASSESSMENT / PLAN:  PULMONARY A: Bilateral infiltrates, favor pulm edema / effusions but on CT chest can not rule out component PNA given infiltrate with air bronchograms, etc.  Also some component atelectasis with loculated effusion on right Rule out PNA - PCT 2.8, has not produced any sputum Acute hypoxic respiratory failure - due to above P:   Maintain neg balance pcxr shows bilateral edema O2 suport 05/19/2017 have some periods of hypoxia with sats in 84%. We will watch closely chest x-ray were consistent with edema than infection.  White count is not elevated.  T-max is 100.1. He may just be wearing out hopefully we can avoid intubation. He is continued to decline increased work of breathing we will intubate 05/19/2017  CARDIOVASCULAR A:  Cardiogenic shock - possibly some component septic as well Acute on chronic sCHF A.flutter - s/p TEE guided DC-CV ? Adrenal insufficiency - cortisol 41.3 Hx CAD P:  Advanced heart failure following Continue dobutamine, levophed , goals MAP 60 sys 90, hope can reduce further Volume removal per CRRT- doing well with this Continue amio, heparin, ASA, atorvastatin- maintain Assess cortisol 41, dc steroids   RENAL Lab Results  Component Value  Date   CREATININE 2.37 (H) 05/19/2017   CREATININE 2.11 (H) 05/18/2017   CREATININE 1.93 (H) 05/18/2017   CREATININE 1.88 (H) 01/27/2017   CREATININE 2.38 (H) 10/26/2016   Filed Weights   05/17/17 0530 05/18/17 0400 05/19/17 0400  Weight: 194 lb 7.1 oz (88.2 kg) 187 lb 9.8 oz (85.1 kg) 185 lb 6.5 oz (84.1 kg)   A:   Acute on chronic renal failure Mild hyponatremia - hypervolemic P:   CRRT with negative flow Per renal with rate removal Levophed down to 3.8 Remains on dobutamine and amiodarone  GASTROINTESTINAL A:   Abdo wall edema improved P:   Diet Last bowel movement was 05/17/2017  HEMATOLOGIC Recent Labs    05/18/17 0422 05/19/17 0330  HGB 11.6* 11.5*  A:   DVT risk P:  Continue heparin gtt Follow CBC, plat is on rise with neg balance  INFECTIOUS A:   Rt PNA P:   05/19/2017 currently on ceftriaxone day 1. 05/19/2017 noted to have chills with T-max of 100.1. Will recheck pro-calcitonin and continue current antibiotics Stop date added 05/19/2017 added vancomycin for fever empirically.  ENDOCRINE CBG (last 3)  Recent Labs    05/17/17 1800 05/17/17 2113 05/18/17 0828  GLUCAP 134* 141* 100*    A:   Mild hyperglycemia - at risk worsening with stress roids Rule out adrenal insuff - not seen P:   cbg wnl, check in am bmet Steroids have been discontinued  NEUROLOGIC A: No acute issues. Awake alert sitting in chair P: mobilize   FAMILY  - Updates: Pt updated at bedside 05/19/2017.  - Inter-disciplinary family meet or Palliative Care meeting due by: 12/3  Ccm time 30 min.  Richardson Landry Minor ACNP Maryanna Shape PCCM Pager [phone] till 1 pm If no answer page [phone] 05/19/2017, 9:26 AM  Attending Note:  66 year old male with history of CHF and pulmonary edema presenting with respiratory failure and renal failure and heart failure.  Now on CRRT for volume removal with diffuse crackles on exam and cardiogenic shock.  Patient clearly needs to be  intubated on exam.  I reviewed CXR myself with ETT is in good position and pulmonary edema noted.  Will adjust vent for ABG.  Continue full vent support.  Hold off weaning.  Continue CRRT negative as BP allows.  Pressor for BP support.  PCCM will follow.  The patient is critically ill with multiple organ systems failure and requires high complexity decision making for assessment and support, frequent evaluation and titration of therapies, application of advanced monitoring technologies and extensive interpretation of multiple databases.   Critical Care Time devoted to patient care services described in this note is  35  Minutes. This time reflects time of care of this signee Dr Jennet Maduro. This critical care time does not reflect procedure time, or teaching time or supervisory time of PA/NP/Med student/Med Resident etc but could involve care discussion time.  Rush Farmer, M.D. [facility] Pulmonary/Critical Care Medicine. Pager: [phone]. After hours pager: [phone].

## 2017-05-19 NOTE — Progress Notes (Signed)
Dr. Haroldine Laws called to check on pt. Verbal orders received to check an ABG and give bicarb if necessary.   RN checked ABG and no bicarb needed at this time.   Will continue to monitor.   Dillon Cisneros E Dillon Cisneros, [place]

## 2017-05-20 ENCOUNTER — Inpatient Hospital Stay (HOSPITAL_COMMUNITY): Payer: Medicare Other

## 2017-05-20 DIAGNOSIS — B59 Pneumocystosis: Secondary | ICD-10-CM

## 2017-05-20 DIAGNOSIS — J9602 Acute respiratory failure with hypercapnia: Secondary | ICD-10-CM

## 2017-05-20 LAB — COOXEMETRY PANEL
CARBOXYHEMOGLOBIN: 0.7 % (ref 0.5–1.5)
METHEMOGLOBIN: 1 % (ref 0.0–1.5)
O2 Saturation: 52.1 %
Total hemoglobin: 15.2 g/dL (ref 12.0–16.0)

## 2017-05-20 LAB — POCT I-STAT 3, ART BLOOD GAS (G3+)
ACID-BASE EXCESS: 7 mmol/L — AB (ref 0.0–2.0)
ACID-BASE EXCESS: 2 mmol/L (ref 0.0–2.0)
BICARBONATE: 34.9 mmol/L — AB (ref 20.0–28.0)
BICARBONATE: 29.9 mmol/L — AB (ref 20.0–28.0)
O2 Saturation: 90 %
O2 Saturation: 100 %
TCO2: 37 mmol/L — AB (ref 22–32)
TCO2: 32 mmol/L (ref 22–32)
pCO2 arterial: 64 mmHg — ABNORMAL HIGH (ref 32.0–48.0)
pCO2 arterial: 58 mmHg — ABNORMAL HIGH (ref 32.0–48.0)
pH, Arterial: 7.342 — ABNORMAL LOW (ref 7.350–7.450)
pH, Arterial: 7.314 — ABNORMAL LOW (ref 7.350–7.450)
pO2, Arterial: 62 mmHg — ABNORMAL LOW (ref 83.0–108.0)
pO2, Arterial: 203 mmHg — ABNORMAL HIGH (ref 83.0–108.0)

## 2017-05-20 LAB — CBC
HCT: 34.6 % — ABNORMAL LOW (ref 39.0–52.0)
Hemoglobin: 10.8 g/dL — ABNORMAL LOW (ref 13.0–17.0)
MCH: 31.7 pg (ref 26.0–34.0)
MCHC: 31.2 g/dL (ref 30.0–36.0)
MCV: 101.5 fL — ABNORMAL HIGH (ref 78.0–100.0)
Platelets: 65 10*3/uL — ABNORMAL LOW (ref 150–400)
RBC: 3.41 MIL/uL — ABNORMAL LOW (ref 4.22–5.81)
RDW: 17 % — ABNORMAL HIGH (ref 11.5–15.5)
WBC: 14.6 10*3/uL — ABNORMAL HIGH (ref 4.0–10.5)

## 2017-05-20 LAB — RENAL FUNCTION PANEL
Albumin: 2.4 g/dL — ABNORMAL LOW (ref 3.5–5.0)
Anion gap: 22 — ABNORMAL HIGH (ref 5–15)
BUN: 21 mg/dL — ABNORMAL HIGH (ref 6–20)
CO2: 28 mmol/L (ref 22–32)
Calcium: 6.7 mg/dL — ABNORMAL LOW (ref 8.9–10.3)
Chloride: 83 mmol/L — ABNORMAL LOW (ref 101–111)
Creatinine, Ser: 2.52 mg/dL — ABNORMAL HIGH (ref 0.61–1.24)
GFR calc Af Amer: 29 mL/min — ABNORMAL LOW (ref >60)
GFR calc non Af Amer: 25 mL/min — ABNORMAL LOW (ref >60)
Glucose, Bld: 94 mg/dL (ref 65–99)
Phosphorus: 5.8 mg/dL — ABNORMAL HIGH (ref 2.5–4.6)
Potassium: 4.5 mmol/L (ref 3.5–5.1)
Sodium: 133 mmol/L — ABNORMAL LOW (ref 135–145)

## 2017-05-20 LAB — PROCALCITONIN: Procalcitonin: 24.81 ng/mL

## 2017-05-20 LAB — APTT: aPTT: 62 seconds — ABNORMAL HIGH (ref 24–36)

## 2017-05-20 LAB — HEPARIN LEVEL (UNFRACTIONATED): Heparin Unfractionated: 0.39 IU/mL (ref 0.30–0.70)

## 2017-05-20 LAB — MAGNESIUM: Magnesium: 2.3 mg/dL (ref 1.7–2.4)

## 2017-05-20 MED ORDER — SODIUM CHLORIDE 0.9 % IV SOLN
0.0000 mg/h | INTRAVENOUS | Status: DC
  Administered 2017-05-20: 2 mg/h via INTRAVENOUS
  Filled 2017-05-20: qty 10

## 2017-05-20 MED ORDER — DEXTROSE 5 % IV SOLN
2.0000 g | Freq: Two times a day (BID) | INTRAVENOUS | Status: DC
  Administered 2017-05-20: 2 g via INTRAVENOUS
  Filled 2017-05-20 (×2): qty 2

## 2017-05-20 MED ORDER — HEPARIN (PORCINE) IN NACL 100-0.45 UNIT/ML-% IJ SOLN: 1950.0000 [IU]/h | INTRAMUSCULAR | Status: DC

## 2017-05-21 LAB — CULTURE, BLOOD (ROUTINE X 2)
Culture: NO GROWTH
Culture: NO GROWTH
SPECIAL REQUESTS: ADEQUATE
Special Requests: ADEQUATE

## 2017-05-21 LAB — CULTURE, RESPIRATORY W GRAM STAIN

## 2017-05-21 LAB — CULTURE, RESPIRATORY: CULTURE: NORMAL

## 2017-05-24 LAB — CULTURE, BLOOD (ROUTINE X 2)
Culture: NO GROWTH
Culture: NO GROWTH
SPECIAL REQUESTS: ADEQUATE

## 2017-05-25 ENCOUNTER — Telehealth: Payer: Self-pay | Admitting: Family Medicine

## 2017-05-25 NOTE — Telephone Encounter (Signed)
Monica from [facility] [place] left a message on nurse line regarding patient. She states that patient is deceased and was/is an organ donor. They have already done the recovery for the corneas, but before they can transplant to the recipient, they need a medical and social history on the patient.  Callback# [phone]

## 2017-05-25 NOTE — Telephone Encounter (Signed)
Give it to them

## 2017-05-25 NOTE — Telephone Encounter (Signed)
Spoke to a rep at [facility], they have reached a family member for the info needed.

## 2017-05-30 ENCOUNTER — Telehealth: Payer: Self-pay | Admitting: Family Medicine

## 2017-05-30 NOTE — Telephone Encounter (Signed)
Josie Dixon calling from [place], have additional questions for the [facility], please call [phone]

## 2017-05-30 NOTE — Telephone Encounter (Signed)
Called and spoke to Joiner at [facility] ie: needing more medical hx.

## 2017-06-01 ENCOUNTER — Telehealth (HOSPITAL_COMMUNITY): Payer: Self-pay | Admitting: *Deleted

## 2017-06-01 NOTE — Telephone Encounter (Signed)
Death certificate received today.  Signed by Dr. Aundra Dubin and mailed back to funeral home.    Copy will be scanned to patient's electronic medical record.

## 2017-06-14 NOTE — Progress Notes (Signed)
Subjective:  Events noted- significant decline last 24 hours- now on max pressors- suspected to not survive the day  Objective Vital signs in last 24 hours: Vitals:   May 24, 2017 0500 2017-05-24 0600 2017-05-24 0700 05/24/17 0800  BP:      Pulse:  (!) 25 93   Resp: _0 Temp:    97.7 F (36.5 C)  TempSrc:    Axillary  SpO2:  91% (!) 70%   Weight: 92 kg (202 lb 13.2 oz)     Height:       Weight change: 7.9 kg (17 lb 6.7 oz)  Intake/Output Summary (Last 24 hours) at May 24, 2017 0801 Last data filed at May 24, 2017 0700 Gross per 24 hour  Intake 6706.54 ml  Output 347 ml  Net 6359.54 ml    Assessment/ Plan: Pt is a 67 y.o. yo male with ischemic CM who was admitted on 05/09/2017 with decompensated CHF- has CKD at baseline (1.7-2.1) has required CRRT  Assessment/Plan: 1. Renal- CKD at baseline (1.7-2.1) now with A on CRF in the setting of decompensated heart failure requiring CRRT- started on 12/1- clinical decline leading last 24 hours.  CRRT possibly no longer and he is approaching being too unstable for CRRT useful but cardiology wanting to keep it going for right now- feel he will declare himself one way or another in the next few hours 2. HTN-vol- had been running neg with CRRT- CVP 6-13- was positive with events, cont for now 3. Anemia- stable   GOLDSBOROUGH,KELLIE A    Labs: Basic Metabolic Panel: Recent Labs  Lab 05/19/17 0330 05/19/17 1622 05-24-17 0400  NA 130* 132* 133*  K 5.2* 5.7* 4.5  CL 96* 95* 83*  CO2 24 20* 28  GLUCOSE 120* 120* 94  BUN 27* 23* 21*  CREATININE 2.37* 2.45* 2.52*  CALCIUM 8.4* 7.7* 6.7*  PHOS 2.5 5.7* 5.8*   Liver Function Tests: Recent Labs  Lab 05/14/17 0530  05/19/17 0330 05/19/17 1622 2017/05/24 0400  AST 33  --   --   --   --   ALT 37  --   --   --   --   ALKPHOS 167*  --   --   --   --   BILITOT 0.6  --   --   --   --   PROT 6.1*  --   --   --   --   ALBUMIN 3.0*   < > 2.7* 2.6* 2.4*   < > = values in this interval not  displayed.   No results for input(s): LIPASE, AMYLASE in the last 168 hours. No results for input(s): AMMONIA in the last 168 hours. CBC: Recent Labs  Lab 05/14/17 0530  05/16/17 0451 05/17/17 0401 05/18/17 0422 05/19/17 0330 05-24-17 0400  WBC 9.9   < > 12.2* 12.2* 12.9* 11.6* 14.6*  NEUTROABS 8.2*  --   --   --   --   --   --   HGB 11.5*   < > 11.3* 11.2* 11.6* 11.5* 10.8*  HCT 35.6*   < > 34.0* 34.8* 34.7* 35.1* 34.6*  MCV 97.0   < > 98.0 98.6 98.0 97.2 101.5*  PLT 110*   < > 116* 114* 130* 123* 65*   < > = values in this interval not displayed.   Cardiac Enzymes: No results for input(s): CKTOTAL, CKMB, CKMBINDEX, TROPONINI in the last 168 hours. CBG: Recent Labs  Lab 05/17/17 0846 05/17/17 1307 05/17/17 1800  05/17/17 2113 05/18/17 0828  GLUCAP 117* 126* 134* 141* 100*    Iron Studies: No results for input(s): IRON, TIBC, TRANSFERRIN, FERRITIN in the last 72 hours. Studies/Results: Dg Chest Port 1 View  Result Date: 05/19/2017 CLINICAL DATA:  Respiratory failure. History of coronary artery disease and CABG, CHF, aortic valve replacement, stage IV chronic renal insufficiency, former smoker. EXAM: PORTABLE CHEST 1 VIEW COMPARISON:  Portable chest x-ray of May 19, 2017 at 5:44 a.m. FINDINGS: There has been interval intubation of the trachea and of the esophagus. The endotracheal tube tip lies 3.4 cm above the carina. The esophagogastric tube tip and proximal port project below the inferior margin of the image. The lungs are reasonably well inflated. The interstitial markings remain increased bilaterally. The cardiac silhouette remains enlarged and the pulmonary vascularity engorged. The ICD is in stable position. The dual-lumen subclavian catheter on the right has its tip terminating in the proximal SVC. The right internal jugular venous catheter tip projects over the midportion of the SVC. IMPRESSION: Bilateral interstitial edema secondary to CHF. One cannot exclude  superimposed pneumonia on the right in the appropriate clinical setting. There has not been significant interval change since yesterday's study. Appropriate positioning of the endotracheal and esophagogastric tubes. The other support tubes and devices are in stable position. Electronically Signed   By: David  Martinique M.D.   On: 05/19/2017 11:35   Dg Chest Port 1 View  Result Date: 05/19/2017 CLINICAL DATA:  Shortness of breath, respiratory failure EXAM: PORTABLE CHEST 1 VIEW COMPARISON:  Portable chest x-ray of 05/18/2017 FINDINGS: There is little change in patchy airspace disease bilaterally. Cardiomegaly stable and AICD leads remain. Right central venous lines are unchanged in position. Median sternotomy sutures are noted. IMPRESSION: Little change in patchy airspace disease bilaterally with cardiomegaly. Electronically Signed   By: Ivar Drape M.D.   On: 05/19/2017 08:28   Dg Chest Port 1 View  Result Date: 05/18/2017 CLINICAL DATA:  Dialysis patient.  Shortness of breath and cough. EXAM: PORTABLE CHEST 1 VIEW COMPARISON:  05/16/2017 FINDINGS: Chronic cardiomegaly. Pacemaker/AICD appears unchanged. Right internal jugular central line tip in the SVC above the right atrium. Right subclavian central line tip in the SVC at the azygos level. Slight decrease in pulmonary edema and better aeration at the lung bases. IMPRESSION: Radiographic improvement with slight decrease in edema and better aeration at the lung bases. Electronically Signed   By: Nelson Chimes M.D.   On: 05/18/2017 09:52   Medications: Infusions: . sodium chloride 10 mL/hr at 12-Jun-2017 0700  . amiodarone 30 mg/hr (2017-06-12 0700)  . ceFEPime (MAXIPIME) IV    . DOBUTamine 7.5 mcg/kg/min (2017/06/12 0758)  . epinephrine 20 mcg/min (06-12-17 0745)  . fentaNYL infusion INTRAVENOUS 75 mcg/hr (June 12, 2017 0700)  . heparin 1,950 Units/hr (06/12/2017 0747)  . norepinephrine (LEVOPHED) Adult infusion 60 mcg/min (2017-06-12 0700)  . phenylephrine  (NEO-SYNEPHRINE) Adult infusion 400 mcg/min (06-12-17 0746)  . dialysate (PRISMASATE) 2,000 mL/hr at 06-12-2017 0403  . sodium bicarbonate (isotonic) 1000 mL infusion 500 mL/hr at 12-Jun-2017 0745  . sodium bicarbonate (isotonic) 1000 mL infusion 300 mL/hr at Jun 12, 2017 0407  . sodium chloride    . vancomycin    . vasopressin (PITRESSIN) infusion - *FOR SHOCK* 0.03 Units/min (06/12/2017 0700)    Scheduled Medications: . allopurinol  100 mg Oral Daily  . aspirin EC  81 mg Oral Daily  . atorvastatin  40 mg Oral q1800  . chlorhexidine gluconate (MEDLINE KIT)  15 mL Mouth Rinse BID  .  Chlorhexidine Gluconate Cloth  6 each Topical Daily  . guaiFENesin  600 mg Oral BID  . mouth rinse  15 mL Mouth Rinse BID  . mouth rinse  15 mL Mouth Rinse 10 times per day  . multivitamin with minerals  1 tablet Oral Daily  . sodium chloride flush  10-40 mL Intracatheter Q12H  . THROMBI-PAD  1 each Topical Once    have reviewed scheduled and prn medications.  Physical Exam: General: now intubated- sedated- max pressors Heart: tachy Lungs: CBS bilat Abdomen: obese, soft Extremities: pitting edema Dialysis Access: right Canalou cath- placed 12/1    May 27, 2017,8:01 AM  LOS: 11 days

## 2017-06-14 NOTE — Progress Notes (Signed)
PT Cancellation Note  Patient Details Name: Utah Delauder MRN: [id number] DOB: [birth year]   Cancelled Treatment:    Reason Eval/Treat Not Completed: (P) Patient not medically ready;Medical issues which prohibited therapy(Pt continues to decline medically, will cancel PT tx for today and inform supervising PT to sign off.  )   Lorie Melichar Eli Hose 06/08/17, 10:22 AM  Governor Rooks, PTA pager [phone]

## 2017-06-14 NOTE — Discharge Summary (Signed)
    Advanced Heart Failure Death Summary  Death Summary   Patient ID: Dillon Cisneros MRN: [id number], DOB/AGE: 09/28/1950 67 y.o. Admit date: 05/09/2017 D/C date:     2017/06/17    Primary Discharge Diagnoses:  1. Shock: Possible mixed septic/cardiogenic.  2. Acute on chronic systolic CHF: Ischemic cardiomyopathy. EF 20-25% on 6/18 echo. St Jude CRT-D device.   3. CAD: S/p CABG.   4. Bioprosthetic aortic valve 5. S/p mitral valve repair: Stable on last echo. No change.   6. AKI on CKD stage 3:  Creatinine peaked at 3.27 Placed CVVHD  7. Atrial flutter:  New on admit.  Had TEE/ DC-CV 11/27 8. ID: Suspect RLL PNA on CT chest 12/3. Treated for HCAP. Procalcitonin peaked at 25 9. Thrombocytopenia: Mild, appears to have pre-dated heparin gtt.  May be due to sepsis.  10. Acute Respiratory Failure : Requiring intubation 05/19/17    Hospital Course:   Dillon Cisneros is a 67 yo with history of CAD, aortic and mitral valve disease, ischemic cardiomyopathy, and CKD stage 3 was admitted with acute on chronic systolic CHF, ?PNA, AKI on CKD, and atrial flutter. On admit he was in A flutter that was new for him so he was placed on heparin and paced with reprogrammed. On 11/27 he underwent TEE followed by successful cardioversion. Post procedure he had many PVCs so amio drip was increased. Later that day he developed hypotension with SBP in the 70s, acute dyspnea with marked volume overload. Attempted to diurese with IV lasix but developed worsening renal function and hypotension. Moved to ICU with central access placed but failed to diurese. Placed on lasix drip, dobutamine, and norepi but had no improvement.   Nephrology consulted and he was placed on CVVHD but continued to worsen. Developed hypotension requiring multiple pressors. Concern for mixed septic/cardiogenic shock with pressure in the 60s and procalcitonin 25. Due to worsening oxygenation CCM consulted and he was intubated.  On 06-18-2023, CXR was worse  despite FiO2 100%. He was on max pressor support with norepi at 60 mcg, epi 20 mcg, neo 400 mcg, dobutamine 4 mcg, and vasopressin at 0.03 units and continued to decline with BP dropping.    Due to progressive decline his wife requested comfort care. He passed on 18-Jun-2023.    Duration of Discharge Encounter: Greater than 35 minutes   Signed, Darrick Grinder, NP 05/23/2017, 1:22 PM

## 2017-06-14 NOTE — Progress Notes (Signed)
225 ml IV fentanyl wasted in sink; 13ml IV versed wasted in sink.  Witnessed by Berniece Salines RN

## 2017-06-14 NOTE — Progress Notes (Signed)
Patient ID: Dillon Cisneros, male   DOB: 09/22/1950, 67 y.o.   MRN: [id number]     Advanced Heart Failure Rounding Note  PCP: Meda Coffee Cardiologist: Aundra Dubin  Subjective:   11/27 S/P TEE-guided DCCV from flutter to NSR with improved BiV pacing.   11/27developed acute dyspnea and dizziness. Frequent PVCs. He was hypotensive. Amio increased to 60 mg per hour, norepi added, and dobutamine 2.5 mcg started with low co-ox.   He continued to diurese poorly despite inotrope support with rise in creatinine, CVP > 20. We attempted transition from dobutamine to milrinone but this did not affect UOP significantly.  Renal consulted 12/1 and CVVH started.  Lasix gtt and apixaban stopped and heparin gtt started.   Developed worsening respiratory status and acidosis 05/19/17 requiring intubation and escalation of pressors.  Discussion with wife made NO CPR.   This am on Levophed 60, Epi 20, Neo 400, Dobutamine 4, Vasopressin 0.03 and amiodarone 30.   CVVH given 300 + fluid back an hour + bicarb.   Tma 103.1 yesterday.  Afebrile this am. Remains on vancomycin and rocephin.   Pressures remain soft in 50-60s. Pt intermittently awake and responding to voice.   CVP 14-15.   CT chest 12/3 with pulmonary edema and possible RLL PNA. Cultures pending. PCT 24.81. WBC 14.6  TEE: EF 25% with mildly dilated LV, septal-lateral dyssynchrony, mildly dilated RV with mildly decreased systolic function, s/p MV repair with moderate eccentric MR, bioprosthetic AoV looked normal.  Objective:   Weight Range: 202 lb 13.2 oz (92 kg) Body mass index is 31.77 kg/m.   Vital Signs:   Temp:  [97.6 F (36.4 C)-103.1 F (39.5 C)] 98.7 F (37.1 C) (12/07 0400) Pulse Rate:  [25-142] 93 (12/07 0700) Resp:  [9-35] 14 (12/07 0700) BP: (68-102)/(56-76) 78/59 (12/06 1500) SpO2:  [70 %-100 %] 70 % (12/07 0700) Arterial Line BP: (54-95)/(34-55) 63/37 (12/07 0700) FiO2 (%):  [50 %-100 %] 50 % (12/07 0400) Weight:  [202 lb 13.2 oz (92 kg)]  202 lb 13.2 oz (92 kg) (12/07 0500) Last BM Date: 05/18/17  Weight change: Filed Weights   05/18/17 0400 05/19/17 0400 06-13-17 0500  Weight: 187 lb 9.8 oz (85.1 kg) 185 lb 6.5 oz (84.1 kg) 202 lb 13.2 oz (92 kg)    Intake/Output:   Intake/Output Summary (Last 24 hours) at 2017-06-13 0730 Last data filed at 2017-06-13 0700 Gross per 24 hour  Intake 6748.64 ml  Output 535 ml  Net 6213.64 ml    Physical Exam  CVP 14-15  General: Intubated/sedated. Acutely ill appearing.  HEENT: + ETT Neck: Supple. JVP to jaw. Carotids 2+ bilat; no bruits. No thyromegaly or nodule noted. Cor: PMI nondisplaced. RRR, No M/G/R noted Lungs: Diminished basilar sounds Abdomen: Soft, non-tender, non-distended, no HSM. No bruits or masses. +BS  Extremities: No cyanosis, clubbing, or rash. Trace ankle edema.  Neuro: Intubated/sedated  Telemetry   BiV pacing in 90s, personally reviewed.   Labs    CBC Recent Labs    05/19/17 0330 2017/06/13 0400  WBC 11.6* 14.6*  HGB 11.5* 10.8*  HCT 35.1* 34.6*  MCV 97.2 101.5*  PLT 123* 65*   Basic Metabolic Panel Recent Labs    05/19/17 0330 05/19/17 1622 06/13/2017 0400  NA 130* 132* 133*  K 5.2* 5.7* 4.5  CL 96* 95* 83*  CO2 24 20* 28  GLUCOSE 120* 120* 94  BUN 27* 23* 21*  CREATININE 2.37* 2.45* 2.52*  CALCIUM 8.4* 7.7* 6.7*  MG 2.4  --  2.3  PHOS 2.5 5.7* 5.8*   Liver Function Tests Recent Labs    05/19/17 1622 06/05/17 0400  ALBUMIN 2.6* 2.4*   No results for input(s): LIPASE, AMYLASE in the last 72 hours. Cardiac Enzymes No results for input(s): CKTOTAL, CKMB, CKMBINDEX, TROPONINI in the last 72 hours.  BNP: BNP (last 3 results) Recent Labs    05/11/17 0357 05/12/17 0410 05/13/17 0444  BNP 1,412.5* 1,720.5* 2,177.6*    ProBNP (last 3 results) No results for input(s): PROBNP in the last 8760 hours.   D-Dimer No results for input(s): DDIMER in the last 72 hours. Hemoglobin A1C No results for input(s): HGBA1C in the last  72 hours. Fasting Lipid Panel No results for input(s): CHOL, HDL, LDLCALC, TRIG, CHOLHDL, LDLDIRECT in the last 72 hours. Thyroid Function Tests No results for input(s): TSH, T4TOTAL, T3FREE, THYROIDAB in the last 72 hours.  Invalid input(s): FREET3  Other results:   Imaging    Dg Chest Port 1 View  Result Date: 05/19/2017 CLINICAL DATA:  Respiratory failure. History of coronary artery disease and CABG, CHF, aortic valve replacement, stage IV chronic renal insufficiency, former smoker. EXAM: PORTABLE CHEST 1 VIEW COMPARISON:  Portable chest x-ray of May 19, 2017 at 5:44 a.m. FINDINGS: There has been interval intubation of the trachea and of the esophagus. The endotracheal tube tip lies 3.4 cm above the carina. The esophagogastric tube tip and proximal port project below the inferior margin of the image. The lungs are reasonably well inflated. The interstitial markings remain increased bilaterally. The cardiac silhouette remains enlarged and the pulmonary vascularity engorged. The ICD is in stable position. The dual-lumen subclavian catheter on the right has its tip terminating in the proximal SVC. The right internal jugular venous catheter tip projects over the midportion of the SVC. IMPRESSION: Bilateral interstitial edema secondary to CHF. One cannot exclude superimposed pneumonia on the right in the appropriate clinical setting. There has not been significant interval change since yesterday's study. Appropriate positioning of the endotracheal and esophagogastric tubes. The other support tubes and devices are in stable position. Electronically Signed   By: David  Martinique M.D.   On: 05/19/2017 11:35     Medications:     Scheduled Medications: . allopurinol  100 mg Oral Daily  . aspirin EC  81 mg Oral Daily  . atorvastatin  40 mg Oral q1800  . chlorhexidine gluconate (MEDLINE KIT)  15 mL Mouth Rinse BID  . Chlorhexidine Gluconate Cloth  6 each Topical Daily  . guaiFENesin  600 mg Oral  BID  . mouth rinse  15 mL Mouth Rinse BID  . mouth rinse  15 mL Mouth Rinse 10 times per day  . multivitamin with minerals  1 tablet Oral Daily  . sodium chloride flush  10-40 mL Intracatheter Q12H  . THROMBI-PAD  1 each Topical Once    Infusions: . sodium chloride 10 mL/hr at 06-05-2017 0700  . amiodarone 30 mg/hr (06-05-17 0700)  . cefTRIAXone (ROCEPHIN)  IV Stopped (05/19/17 2017)  . DOBUTamine 4 mcg/kg/min (06/05/2017 0700)  . epinephrine 20 mcg/min (2017-06-05 0700)  . fentaNYL infusion INTRAVENOUS 75 mcg/hr (June 05, 2017 0700)  . heparin    . norepinephrine (LEVOPHED) Adult infusion 60 mcg/min (2017/06/05 0700)  . phenylephrine (NEO-SYNEPHRINE) Adult infusion 400 mcg/min (2017-06-05 0700)  . dialysate (PRISMASATE) 2,000 mL/hr at 06-05-2017 0403  . sodium bicarbonate (isotonic) 1000 mL infusion 500 mL/hr at 06/05/2017 0407  . sodium bicarbonate (isotonic) 1000 mL infusion 300 mL/hr at 06/05/17 0407  .  sodium chloride    . vancomycin    . vasopressin (PITRESSIN) infusion - *FOR SHOCK* 0.03 Units/min (06/13/17 0700)    PRN Medications: Place/Maintain arterial line **AND** sodium chloride, acetaminophen, alteplase, fentaNYL, guaiFENesin-dextromethorphan, heparin, midazolam, nitroGLYCERIN, ondansetron (ZOFRAN) IV, sodium chloride, sodium chloride flush    Patient Profile   67 yo with history of CAD, aortic and mitral valve disease, ischemic cardiomyopathy, and CKD stage 3 was admitted with acute on chronic systolic CHF, ?PNA, AKI on CKD, and atrial flutter.   Assessment/Plan   1. Shock: Possible mixed septic/cardiogenic. Started on norepi and dobutamine => transitioned to milrinone with ongoing poor UOP, now back to dobutamine with renal failure on CVVH. - SBP in 60s this am on on Levophed 60, Epi 20, Neo 400, Dobutamine 4, Vasopressin 0.03 and amiodarone 30.   - Prognosis extremely guarded. 2. Acute on chronic systolic CHF: Ischemic cardiomyopathy.  EF 20-25% on 6/18 echo.  St Jude CRT-D  device.  He remains volume overloaded but improving with CVVH.  - He is maxed out on pressor support as above with continued soft pressures.   - Coox 52% this am.  - He is off Galactic study drug with intubation. - At baseline, percentage of BiV pacing is below goal.  Likely due to mode switching with baseline sinus tachy and faulty atrial lead.  Will need atrial lead re-worked if he recovers from current episode. EP following.  3. CAD: S/p CABG.   - No s/s of ischemia.    - TnI mildly elevated with no trend, likely due to demand ischemia/volume overload.   - Continue ASA 81.  - Continue atorvastatin 40 daily.  4. Bioprosthetic aortic valve: Stable on last echo. No change.  5. S/p mitral valve repair: Stable on last echo. No change.   6. AKI on CKD stage 3: Remains on CVVH.  - Now getting fluid back with profound shock.  7. Atrial flutter: New for him, with RVR.  Has triggered CHF and likely AKI.  S/P TEE-guided DC-CV.  Rhythm today is difficult.  - Per St Jude interrogation, in NSR underlying.  - Continue amio drip at 30 mg/hr.  - Eliquis stopped and now on heparin gtt with CVVH.  8. ID: Suspect RLL PNA on CT chest 12/3.  PCT 2.84 on 12/3.  Cultures NGTD.   - Tmax 103.1 - Broaden abx to Cefipime. Continue vanc.  - PCT 24 this am.  9. Frequent PVCs/NSVT:  - Continue amio drip. No change.  10. Thrombocytopenia: Mild, appears to have pre-dated heparin gtt.  May be due to sepsis.   Patient is critically ill and remains in imminent danger of multiple organ failure.  Prognosis extremely tenuous.   Length of Stay: [place], [place]  Jun 13, 2017 7:30 AM   Advanced Heart Failure Team Pager [phone] (M-F; 7a - 4p)  Please contact [facility] for night-coverage after hours (4p -7a ) and weekends on [email or web address]  Patient seen with PA, agree with the above note.  He is critically ill with maximized pressors yet remains hypotensive.  He does awaken to voice and appears to  respond.  He is getting bicarbonate via CVVH.  PCT to 24 this morning.  CVP 13-14.  On exam, intermittently alert with JVD and crackles on lung exam.  HR in 90s.    Continue current pressor support, we have no room to increase. With co-ox 52%, will increase dobutamine to 7.5.   Will broaden antibiotics to cefepime/vancomycin.   I  suspect septic shock superimposed on cardiogenic shock and AKI.  Very poor prognosis at this point.  ICD has been deactivated.  I talked with his wife this morning and she understands that he may not survive the day.   CRITICAL CARE Performed by: Loralie Champagne  Total critical care time: 35 minutes  Critical care time was exclusive of separately billable procedures and treating other patients.  Critical care was necessary to treat or prevent imminent or life-threatening deterioration.  Critical care was time spent personally by me on the following activities: development of treatment plan with patient and/or surrogate as well as nursing, discussions with consultants, evaluation of patient's response to treatment, examination of patient, obtaining history from patient or surrogate, ordering and performing treatments and interventions, ordering and review of laboratory studies, ordering and review of radiographic studies, pulse oximetry and re-evaluation of patient's condition.  Loralie Champagne May 22, 2017 7:51 AM

## 2017-06-14 NOTE — Procedures (Signed)
Extubation Procedure Note  Patient Details:   Name: Lyndle Pang DOB: 11-Jan-1951 MRN: [id number]   Airway Documentation:     Evaluation  O2 sats: currently acceptable Complications: No apparent complications Patient did tolerate procedure well. Bilateral Breath Sounds: Diminished, Other (Comment)   No   Terminal extubation.  Family and RN at bedside.  Mingo Amber Naturi Alarid 02-Jun-2017, 5:08 PM

## 2017-06-14 NOTE — Death Summary Note (Deleted)
Advanced Heart Failure Death Summary  Death Summary   Patient ID: Dillon Cisneros MRN: [id number], DOB/AGE: Jun 16, 1950 67 y.o. Admit date: 05/09/2017 D/C date:     05/24/2017   Primary Discharge Diagnoses:  1. Shock: Possible mixed septic/cardiogenic 2. Acute on chronic systolic CHF: Ischemic cardiomyopathy. EF 20-25% on 6/18 echo. St Jude CRT-D device. 3. CAD: S/p CABG.   4. Bioprosthetic aortic valve 5. S/p mitral valve repair 6. ARF on CKD stage 3 7. Atrial flutter: 8.PNA  9. Frequent PVCs/NSVT:  10. Thrombocytopenia:     Hospital Course:   Dillon Cisneros was a 67 y.o. male with history of CAD, aortic and mitral valve disease, ischemic cardiomyopathy, and CKD stage 3 was admitted with acute on chronic systolic CHF, ?PNA, AKI on CKD, and atrial flutter.   Pt had very complicated admission.  Please see most recent progress note for further narrative.   11/27 S/P TEE-guided DCCV from flutter to NSR with improved BiV pacing.   11/27developed acute dyspnea and dizziness. Frequent PVCs. He was hypotensive. Amio increased to 60 mg per hour, norepi added, and dobutamine 2.5 mcg started with low co-ox.   He continued to diurese poorly despite inotrope support with rise in creatinine, CVP > 20. We attempted transition from dobutamine to milrinone but this did not affect UOP significantly.  Renal consulted 12/1 and CVVH started.  Lasix gtt and apixaban stopped and heparin gtt started.   Developed worsening respiratory status and acidosis 05/19/17 requiring intubation and escalation of pressors. Discussion with wife made NO CPR.  Pt continued to deteriorate into 05/29/17 despite max pressor support. With further discussion with family, decision made for terminal extubation with initiation of full comfort care.   Problem based HPI as follows.  1. Shock: Possible mixed septic/cardiogenic. Started on norepi and dobutamine => transitioned to milrinone with ongoing poor UOP, then back to  dobutamine with renal failure on CVVH. - Had acute decompensation 05/19/17 with spiked fever, and continued to detioorrate into 05-29-2017. After long discussion with family, decision was made for terminal extubation and comfort care on 05-29-17. Pt passed away soon after.  2. Acute on chronic systolic CHF: Ischemic cardiomyopathy. EF 20-25% on 6/18 echo. St Jude CRT-D device.  - Pt volume overloaded this admission requiring maximum  pressor support on max dose levophed, epinephrine, neosynephrine, vasopressin, and dobutmine at 4 mcg/kg/min.  -At baseline, percentage of BiV pacing noted to be below goal.  Likely due to mode switching with baseline sinus tachy and faulty atrial lead.   3. CAD: S/p CABG.   - No s/s of ischemia this admission. TnI mildly elevated with no trend, likely due to demand ischemia/volume overload.   4. Bioprosthetic aortic valve: No change on echo this admission.  5. S/p mitral valve repair: No change on echo this admission.  6. ARF on CKD stage 3:  - In setting of cardiogenic shock requiring CVVHD. Prior to his passing, was requiring fluid return via CVVHD with marked hypotension.  7. Atrial flutter:  - New for him this admission. Thought to have triggered his CHF and AKI. Pt underwent TEE/DCCV this admission with return to NSR confirmed by [facility] interrogation.  8. ID: Suspect RLL PNA on CT chest 12/3.  PCT 2.84 on 12/3 -> 24 05/17/17.  Cultures NGTD.   - Tmax 103.1 on 05/19/17. ABX broadended but pt continued to decline throughout the day and into the next.  9. Frequent PVCs/NSVT:  - Treated with amiodarone drip.  10. Thrombocytopenia:  -  In setting of shock.   Ultimately, [name] passed away from multiple system organ failure in the setting of septic and cardiogenic shock.   Every effort was made during this admission to improve the patients clinical picture.   Duration of Discharge Encounter: Greater than 35 minutes   Signed, Shirley Friar,  PA-C 05/24/2017, 3:15 PM

## 2017-06-14 NOTE — Progress Notes (Signed)
Pharmacy Antibiotic Note  Dillon Cisneros is a 67 y.o. male being treated for pneumonia. Vancomycin added to ceftriaxone yesterday with worsening respiratory status and fever to 103. PCT has jumped today from 3.96 to 24.81 so ceftriaxone will be changed to cefepime.  He continues on CRRT.  Plan: 1) Cefepime 2g IV q12 2) Follow CRRT plan/tolerance, cultures, LOT  Height: 5\' 7"  (170.2 cm) Weight: 202 lb 13.2 oz (92 kg) IBW/kg (Calculated) : 66.1  Temp (24hrs), Avg:98.9 F (37.2 C), Min:97.7 F (36.5 C), Max:100.3 F (37.9 C)  Recent Labs  Lab 05/16/17 0451  05/17/17 0401  05/18/17 0422 05/18/17 1606 05/19/17 0330 05/19/17 1305 05/19/17 1622 05-29-2017 0400  WBC 12.2*  --  12.2*  --  12.9*  --  11.6*  --   --  14.6*  CREATININE 2.01*   < > 1.86*   < > 1.93* 2.11* 2.37*  --  2.45* 2.52*  LATICACIDVEN  --   --   --   --   --   --   --  2.2* 7.5*  --    < > = values in this interval not displayed.    Estimated Creatinine Clearance: 30.4 mL/min (A) (by C-G formula based on SCr of 2.52 mg/dL (H)).    Allergies  Allergen Reactions  . Penicillins Hives    Has patient had a PCN reaction causing immediate rash, facial/tongue/throat swelling, SOB or lightheadedness with hypotension: No Has patient had a PCN reaction causing severe rash involving mucus membranes or skin necrosis: No Has patient had a PCN reaction that required hospitalization: No Has patient had a PCN reaction occurring within the last 10 years: No If all of the above answers are "NO", then may proceed with Cephalosporin use.   . Nsaids     Should avoid    Antimicrobials this admission: Azithro 11/27 >> 12/1 Ceftriaxone 11/27 >> 12/3, resume 12/5 > 12/7 Ceftazidime 12/3>> 12/5 Vancomycin 12/6 >> Cefepime 12/7 >>  Dose adjustments this admission: n/a  Microbiology results: 12/6 Resp Cx: rare gram + cocci in pairs 12/6 BCx x2: ngtd 12/3 BCx x 2: ngtd 11/30 UCx: neg  11/26 Resp virus panel neg 11/27 MRSA PCR:  neg  Thank you for allowing pharmacy to be a part of this patient's care.  Deboraha Sprang May 29, 2017 11:21 AM

## 2017-06-14 NOTE — Progress Notes (Signed)
Notified Dr. Aundra Dubin of pts visible discomfort and difficulty breathing on the vent.  Called wife back to the hospital.  Dr. Aundra Dubin to bedside at this time and instructed to increase fentanyl gtt for comfort.  Will continue to monitor.

## 2017-06-14 NOTE — Progress Notes (Signed)
RN paged Cards MD on call & notified E-Link MD about patient's a-line pressures being low. No new orders at this time.   Will continue to monitor patient.  [place]

## 2017-06-14 NOTE — Progress Notes (Signed)
Physical Therapy Discharge Patient Details Name: Dillon Cisneros MRN: [id number] DOB: [birth year] Today's Date: 27-May-2017 Time:  -     Patient discharged from PT services secondary to medical decline - will need to re-order PT to resume therapy services.  Please see latest therapy progress note for current level of functioning and progress toward goals.    Progress and discharge plan discussed with patient and/or caregiver: Patient unable to participate in discharge planning and no caregivers available, discussed with nursing.    [place], PTA pager [phone]  27-May-2017, 10:22 AM

## 2017-06-14 NOTE — Progress Notes (Signed)
ANTICOAGULATION CONSULT NOTE - Follow-Up Consult  Pharmacy Consult for heparin > apixaban > heparin Indication: atrial fibrillation  Allergies  Allergen Reactions  . Penicillins Hives    Has patient had a PCN reaction causing immediate rash, facial/tongue/throat swelling, SOB or lightheadedness with hypotension: No Has patient had a PCN reaction causing severe rash involving mucus membranes or skin necrosis: No Has patient had a PCN reaction that required hospitalization: No Has patient had a PCN reaction occurring within the last 10 years: No If all of the above answers are "NO", then may proceed with Cephalosporin use.   . Nsaids     Should avoid    Patient Measurements: Height: 5\' 7"  (170.2 cm) Weight: 202 lb 13.2 oz (92 kg) IBW/kg (Calculated) : 66.1 Heparin Dosing Weight: 84.5kg  Vital Signs: Temp: 97.7 F (36.5 C) (12/07 0800) Temp Source: Axillary (12/07 0800) BP: 72/52 (12/07 0900) Pulse Rate: 97 (12/07 0945)  Labs: Recent Labs    05/18/17 0422 05/18/17 1606 05/19/17 0330 05/19/17 1622 05/23/2017 0400  HGB 11.6*  --  11.5*  --  10.8*  HCT 34.7*  --  35.1*  --  34.6*  PLT 130*  --  123*  --  65*  APTT 58* 58* 81*  --  62*  HEPARINUNFRC 1.01*  --  0.81*  --  0.39  CREATININE 1.93* 2.11* 2.37* 2.45* 2.52*    Estimated Creatinine Clearance: 30.4 mL/min (A) (by C-G formula based on SCr of 2.52 mg/dL (H)).  Medications: Heparin @ 1800 units/hr  Assessment: 66 YOM transferred from [facility] with A-flutter. Underwent successful TEE-DCCV.  Patient was not on anticoagulation PTA.   Patient was previously on heparin drip for afib and transitioned to apixaban post DCCV.  Transitioned back to heparin as patient required CRRT for diuresis. Pharmacy to dose heparin while apixaban on hold, last dose 12/1.   Will use APTT for heparin dosing given apixaban still falsely elevating heparin levels.    APTT this morning is slightly below goal at 62 seconds, however, heparin level  is therapeutic at 0.39. Suspect apixaban has cleared but given drop in APTT will increase heparin rate and check both again this evening. CBC stable. No bleeding reported.  Goal of Therapy:  APTT goal 66-102 sec Heparin level 0.3-0.7 units/ml Monitor platelets by anticoagulation protocol: Yes   Plan: 1) Increase heparin to 1950 units/hr 2) Check APTT and heparin level in 8 hours, if correlating can probably just check heparin levels from then on  [facility], Pharm D, BCPS   23-May-2017 11:25 AM

## 2017-06-14 NOTE — Progress Notes (Signed)
PULMONARY / CRITICAL CARE MEDICINE   Name: Dillon Cisneros MRN: [id number] DOB: 12/20/1950    ADMISSION DATE:  05/09/2017 CONSULTATION DATE:  12/3  REFERRING MD:  Loralie Champagne, MD  CHIEF COMPLAINT:  Worsening infiltrates, hypoxia  HISTORY OF PRESENT ILLNESS:   67 yr old ischemic cardiomyopathy, pacer, admitted end of November for new flutter in seting of CKD known. Flutter was treated and heart failure seemed to worsen. Pt renal function wosened and required cvvhd. On 11/26 pcxr did show some atx/infiltrate rt base. On 11/27 pcxr showed effusion likely on rt base. No resp distress during this time. milrinone was used. He was also treated empirically for CAP, with azithro, CTX. No sig sputum, mild temp 100. WBC slight up but had neg balance at same time on cvvhd. Despite above and neg 3 liters, pcxr today worsening and called to assess. He remains still without distress.  SUBJECTIVE:  Was intubated 05/19/2017 and is continued to decline since.  Early maxed out on pressure support along with inotropic support.  CVVH but unable to pull fluid.  Very little for pulmonary critical care to offer at this time.  We did make some ventilator changes and will reassess in ABG. VITAL SIGNS: BP (!) 72/52   Pulse 97   Temp 97.7 F (36.5 C) (Axillary)   Resp 18   Ht 5\' 7"  (1.702 m)   Wt 202 lb 13.2 oz (92 kg)   SpO2 (!) 73%   BMI 31.77 kg/m   HEMODYNAMICS: CVP:  [6 mmHg-28 mmHg] 21 mmHg  VENTILATOR SETTINGS: Vent Mode: PCV FiO2 (%):  [50 %] 50 % Set Rate:  [10 bmp] 10 bmp PEEP:  [5 cmH20] 5 cmH20 Plateau Pressure:  [15 cmH20] 15 cmH20  INTAKE / OUTPUT: I/O last 3 completed shifts: In: 8237.5 [P.O.:955; I.V.:6632.5; NG/GT:50; IV Piggyback:600] Out: 3147 [Urine:50; GUYQI:3474]  PHYSICAL EXAMINATION: General: Frail appearing male he is in cardiogenic shock fracture to current interventions. HEENT: Endotracheal tube in place right subclavian HD catheter was ecchymosis around the site PSY:  Sedated Neuro: Sedated CV: Sounds are distant PULM: Coarse rhonchi bilaterally, some bases. QV:ZDGL, bowel sounds appreciated Extremities: warm/dry, apply edema  Skin: Multiple areas of ecchymosis noted    LABS:  BMET Recent Labs  Lab 05/19/17 0330 05/19/17 1622 05-24-2017 0400  NA 130* 132* 133*  K 5.2* 5.7* 4.5  CL 96* 95* 83*  CO2 24 20* 28  BUN 27* 23* 21*  CREATININE 2.37* 2.45* 2.52*  GLUCOSE 120* 120* 94    Electrolytes Recent Labs  Lab 05/18/17 0422  05/19/17 0330 05/19/17 1622 May 24, 2017 0400  CALCIUM 8.8*   < > 8.4* 7.7* 6.7*  MG 2.6*  --  2.4  --  2.3  PHOS 2.1*   < > 2.5 5.7* 5.8*   < > = values in this interval not displayed.    CBC Recent Labs  Lab 05/18/17 0422 05/19/17 0330 05-24-2017 0400  WBC 12.9* 11.6* 14.6*  HGB 11.6* 11.5* 10.8*  HCT 34.7* 35.1* 34.6*  PLT 130* 123* 65*    Coag's Recent Labs  Lab 05/18/17 1606 05/19/17 0330 05/24/17 0400  APTT 58* 81* 62*    Sepsis Markers Recent Labs  Lab 05/16/17 0922 05/19/17 0914 05/19/17 1305 05/19/17 1622 05-24-17 0400  LATICACIDVEN  --   --  2.2* 7.5*  --   PROCALCITON 2.84 3.96  --   --  24.81    ABG Recent Labs  Lab 05/19/17 1700 05/19/17 2336 2017/05/24 0803  PHART 7.246*  7.371 7.342*  PCO2ART 49.1* 44.8 64.0*  PO2ART 87.0 94.0 62.0*    Liver Enzymes Recent Labs  Lab 05/14/17 0530  05/19/17 0330 05/19/17 1622 2017-06-02 0400  AST 33  --   --   --   --   ALT 37  --   --   --   --   ALKPHOS 167*  --   --   --   --   BILITOT 0.6  --   --   --   --   ALBUMIN 3.0*   < > 2.7* 2.6* 2.4*   < > = values in this interval not displayed.    Cardiac Enzymes No results for input(s): TROPONINI, PROBNP in the last 168 hours.  Glucose Recent Labs  Lab 05/16/17 2158 05/17/17 0846 05/17/17 1307 05/17/17 1800 05/17/17 2113 05/18/17 0828  GLUCAP 139* 117* 126* 134* 141* 100*    Imaging Dg Chest Port 1 View  Result Date: 02-Jun-2017 CLINICAL DATA:  Check endotracheal  tube placement EXAM: PORTABLE CHEST 1 VIEW COMPARISON:  05/19/2017 FINDINGS: Cardiac shadow is enlarged. Defibrillator is again noted and stable. Right jugular and right subclavian central lines are again noted and stable. Endotracheal tube and nasogastric catheter are noted in satisfactory position. Diffuse bilateral infiltrative changes are noted consistent with pulmonary edema. This has increased somewhat in the interval from the prior exam. Postsurgical changes are again noted. IMPRESSION: Increasing CHF and pulmonary edema. Tubes and lines stable in appearance from the prior exam. Electronically Signed   By: Inez Catalina M.D.   On: 2017-06-02 08:31   Dg Chest Port 1 View  Result Date: 05/19/2017 CLINICAL DATA:  Respiratory failure. History of coronary artery disease and CABG, CHF, aortic valve replacement, stage IV chronic renal insufficiency, former smoker. EXAM: PORTABLE CHEST 1 VIEW COMPARISON:  Portable chest x-ray of May 19, 2017 at 5:44 a.m. FINDINGS: There has been interval intubation of the trachea and of the esophagus. The endotracheal tube tip lies 3.4 cm above the carina. The esophagogastric tube tip and proximal port project below the inferior margin of the image. The lungs are reasonably well inflated. The interstitial markings remain increased bilaterally. The cardiac silhouette remains enlarged and the pulmonary vascularity engorged. The ICD is in stable position. The dual-lumen subclavian catheter on the right has its tip terminating in the proximal SVC. The right internal jugular venous catheter tip projects over the midportion of the SVC. IMPRESSION: Bilateral interstitial edema secondary to CHF. One cannot exclude superimposed pneumonia on the right in the appropriate clinical setting. There has not been significant interval change since yesterday's study. Appropriate positioning of the endotracheal and esophagogastric tubes. The other support tubes and devices are in stable position.  Electronically Signed   By: David  Martinique M.D.   On: 05/19/2017 11:35     STUDIES:  CT chest 12/3 > CHF, possible PNA in RLL, right effusion partially loculated, reactive mediastinal lymph nodes, small perihepatic ascites.  CULTURES: BC 12/3>>> UA 11 32,018- SPUTUM 12/3>>> not done Blood cultures x2 05/19/2017>> Sputum culture 05/19/2017>>  ANTIBIOTICS: ceftaz 12/3>>>12/5 Ctx 12/5>>>plan stop 12/11 Azithro 11/27>>>12/1 05/19/2017 vancomycin>>   SIGNIFICANT EVENTS: 12/1 cvvhd started, shock 12/6 intubated for resp insuff.  LINES/TUBES: 12/1 left sub clav > 12/6 et >>   ASSESSMENT / PLAN:  PULMONARY A: Bilateral infiltrates, favor pulm edema / effusions but on CT chest can not rule out component PNA given infiltrate with air bronchograms, etc.  Also some component atelectasis with loculated effusion on right  Rule out PNA - PCT 2.8, has not produced any sputum Acute hypoxic respiratory failure - due to above P:   Intubated 05/19/2017 Increased respiratory rate with PCO2 noted to be 64 with a pH of 7.34 and with a PO2 of 62 with FiO2 of 100% and repeat ABGs in 1 hour on 23-May-2017 Chest x-ray shows worsening edema despite all interventions.   CARDIOVASCULAR A:  Cardiogenic shock - possibly some component septic as well Acute on chronic sCHF A.flutter - s/p TEE guided DC-CV ? Adrenal insufficiency - cortisol 41.3 Hx CAD P:  Advanced heart failure following 05/23/2017 he has defervesced now on: Norepinephrine at 60 Epinephrine at 20 Neo-Synephrine 400 Vasopressin at 0.03 Amiodarone at 30 Required fluid to be given back via CVVH being given bicarbonate in CVVH His blood pressure remains low despite maximum interventions. He is now no CPR.  RENAL Lab Results  Component Value Date   CREATININE 2.52 (H) May 23, 2017   CREATININE 2.45 (H) 05/19/2017   CREATININE 2.37 (H) 05/19/2017   CREATININE 1.88 (H) 01/27/2017   CREATININE 2.38 (H) 10/26/2016   Filed Weights    05/18/17 0400 05/19/17 0400 May 23, 2017 0500  Weight: 187 lb 9.8 oz (85.1 kg) 185 lb 6.5 oz (84.1 kg) 202 lb 13.2 oz (92 kg)   A:   Acute on chronic renal failure Mild hyponatremia - hypervolemic P:   CRRT continue but unable to pull negatives due to hypotension. Creatinine remains essentially unchanged at 2.5 per renal  GASTROINTESTINAL A:   Abdo wall edema improved P:   Diet Last bowel movement was 05/17/2017  HEMATOLOGIC Recent Labs    05/19/17 0330 05/23/17 0400  HGB 11.5* 10.8*    A:   DVT risk P:  Continue heparin gtt Follow CBC, plat is on rise with neg balance  INFECTIOUS A:   Rt PNA P:   05/23/17 currently on day 1 of Maxipime and vancomycin after spiking fever and have a hard rigors 05/19/2017  ENDOCRINE CBG (last 3)  Recent Labs    05/17/17 1800 05/17/17 2113 05/18/17 0828  GLUCAP 134* 141* 100*    A:   Mild hyperglycemia - at risk worsening with stress roids Rule out adrenal insuff - not seen P:   cbg wnl, check in am bmet Steroids have been discontinued  NEUROLOGIC A: No acute issues. Awake alert sitting in chair P: Currently sedated   FAMILY  - Updates: Pt updated at bedside 05/17/2017.  Note he was made no CPR per cards  - Inter-disciplinary family meet or Palliative Care meeting due by: 12/3  Ccm time 30 min.  Richardson Landry Dartanion Teo ACNP Maryanna Shape PCCM Pager [phone] till 1 pm If no answer page [phone] 05/23/17, 10:15 AM

## 2017-06-14 DEATH — deceased

## 2017-06-28 ENCOUNTER — Encounter (HOSPITAL_COMMUNITY): Payer: Medicare Other | Admitting: Cardiology

## 2017-08-29 ENCOUNTER — Other Ambulatory Visit: Payer: Self-pay | Admitting: Internal Medicine

## 2018-02-15 IMAGING — DX DG CHEST 2V
2 series · 2 of 2 positions shown · non-contrast
Comparison: 11/19/2016

ADDENDUM:
The impression should state:

Cardiomegaly with small bilateral pleural effusions and mild
pulmonary venous congestion.
New right base airspace disease, suspicious for infection or
aspiration.
CLINICAL DATA: COUGH AND SOB X A FEW DAYS, HEART SURGERY IN 1380,
HTN, CHF, FORMER SMOKER SINCE 2332
EXAM:
CHEST  2 VIEW

[chest pa]
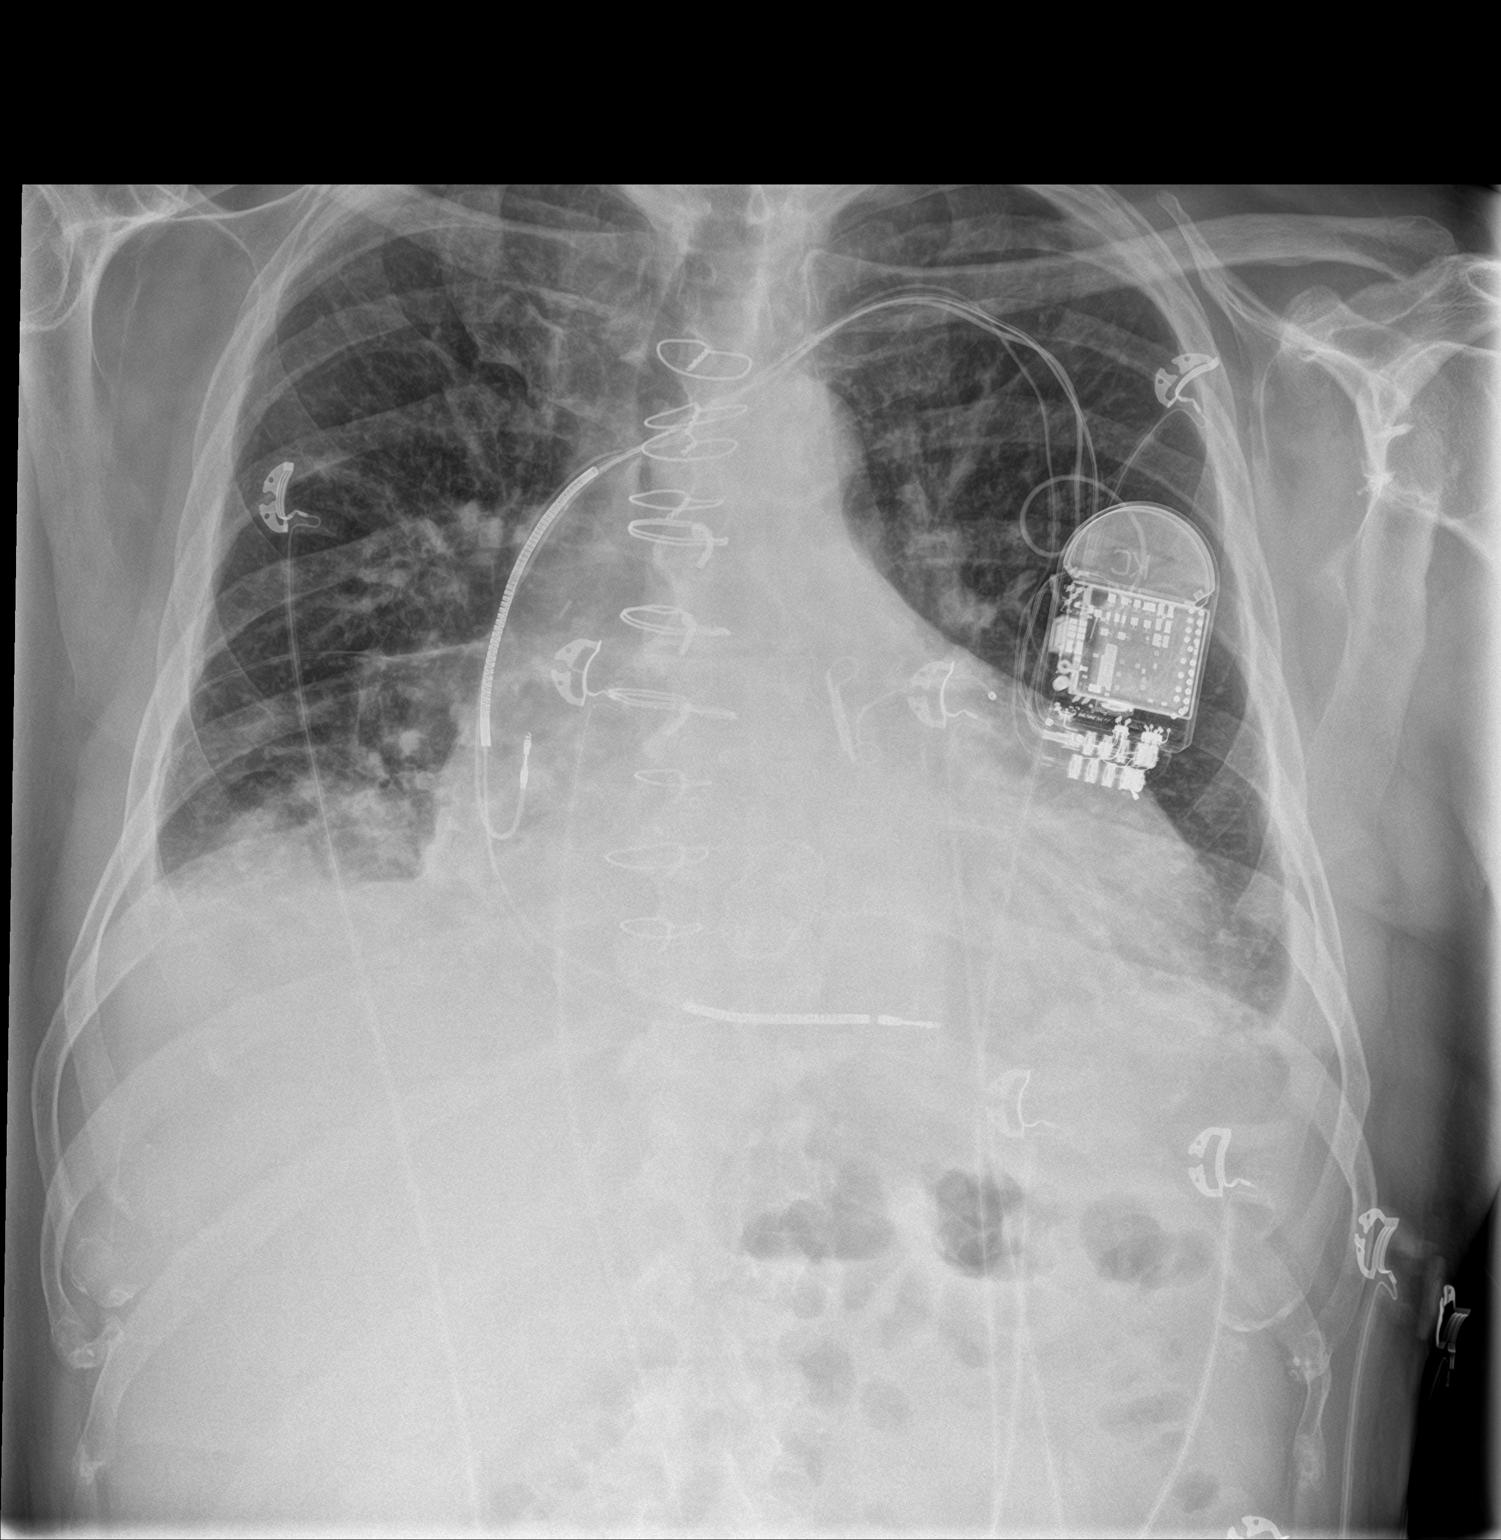

[chest lat]
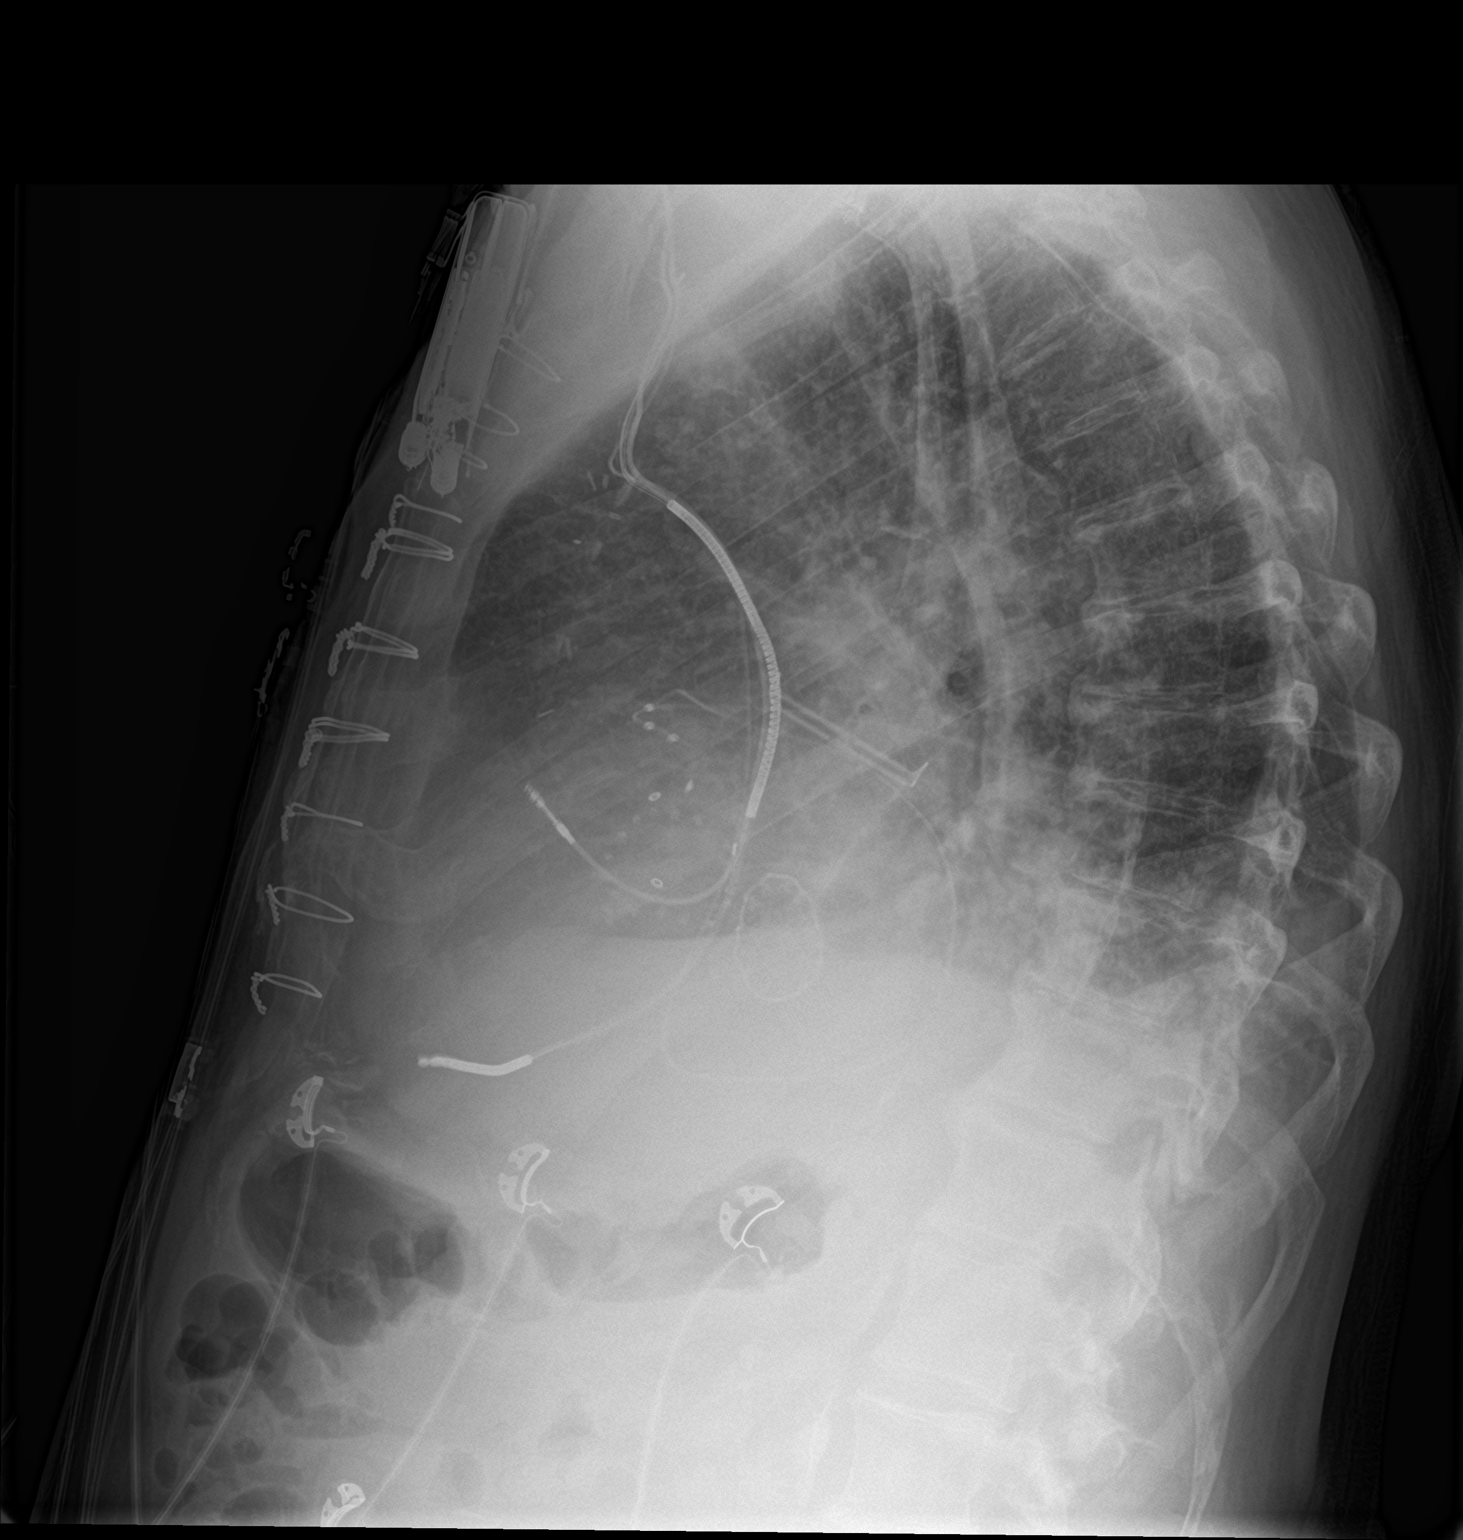

[2 of 2 positions shown; findings below may reference images not displayed]

FINDINGS: Pacer/AICD device. Midline trachea. Moderate cardiomegaly. Small
bilateral pleural effusions. No pneumothorax. Pulmonary interstitial
prominence and indistinctness. New right base airspace disease.
Suspect similar left base subsegmental atelectasis or scarring.

Cardiomegaly with small bilateral pleural effusions and mild
pulmonary venous congestion.

New right base airspace disease, suspicious for infection or
aspiration.
IMPRESSION: No active cardiopulmonary disease.

## 2018-02-16 IMAGING — CR DG CHEST 1V PORT
1 series · 1 of 1 positions shown · non-contrast
Comparison: Radiograph yesterday

CLINICAL DATA: Pneumonia, shortness of breath.

EXAM:
PORTABLE CHEST 1 VIEW

[AP]
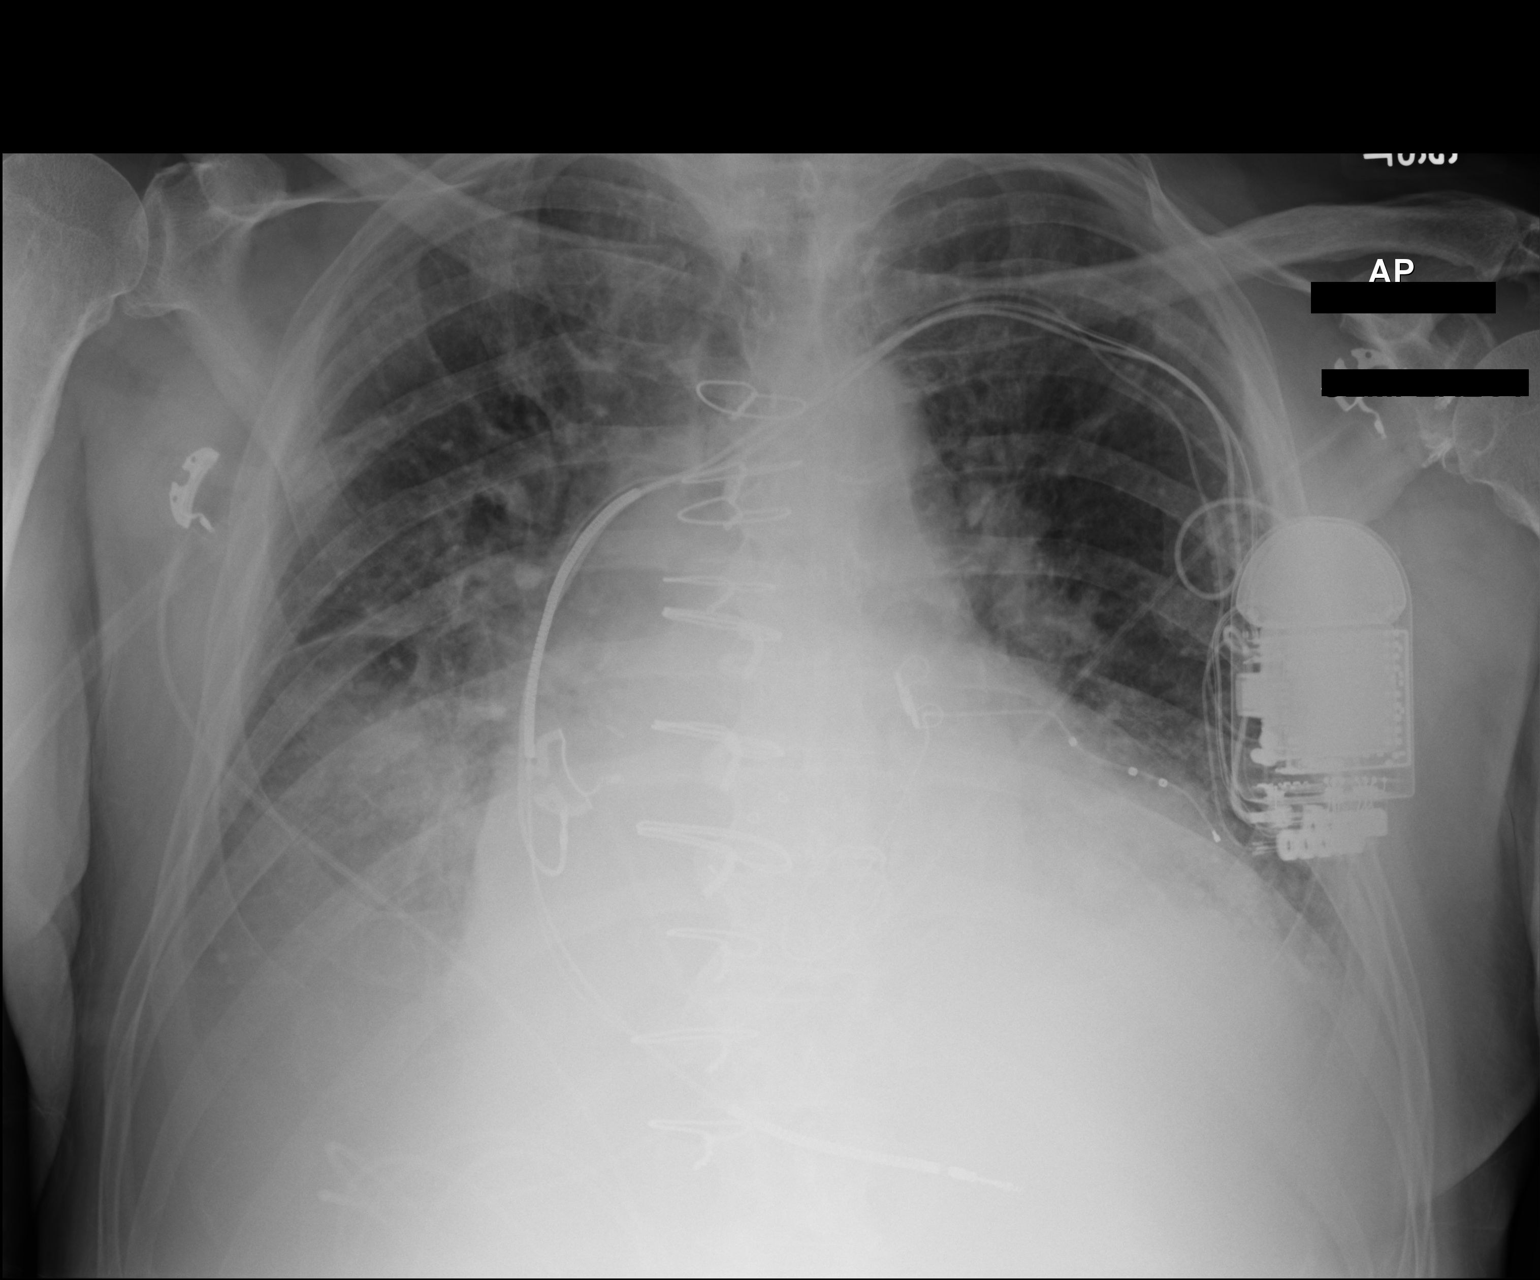

[1 of 1 positions shown; findings below may reference images not displayed]

FINDINGS: Post median sternotomy with prosthetic valve. Multilead left-sided
pacemaker in place. Cardiomegaly again seen. Worsening vascular
congestion. Increasing pleural effusions and bibasilar airspace
disease, right greater than left. No pneumothorax.
IMPRESSION: 1. Findings suggest CHF with increasing vascular congestion and
bilateral pleural effusions.
2. Worsening bibasilar airspace disease, right greater than left,
pneumonia versus vascular overlap.

## 2018-02-19 IMAGING — DX DG CHEST 1V PORT
1 series · 1 of 1 positions shown · non-contrast
Comparison: 05/10/2017

CLINICAL DATA: Shortness of Breath

EXAM:
PORTABLE CHEST 1 VIEW

[chest ap]
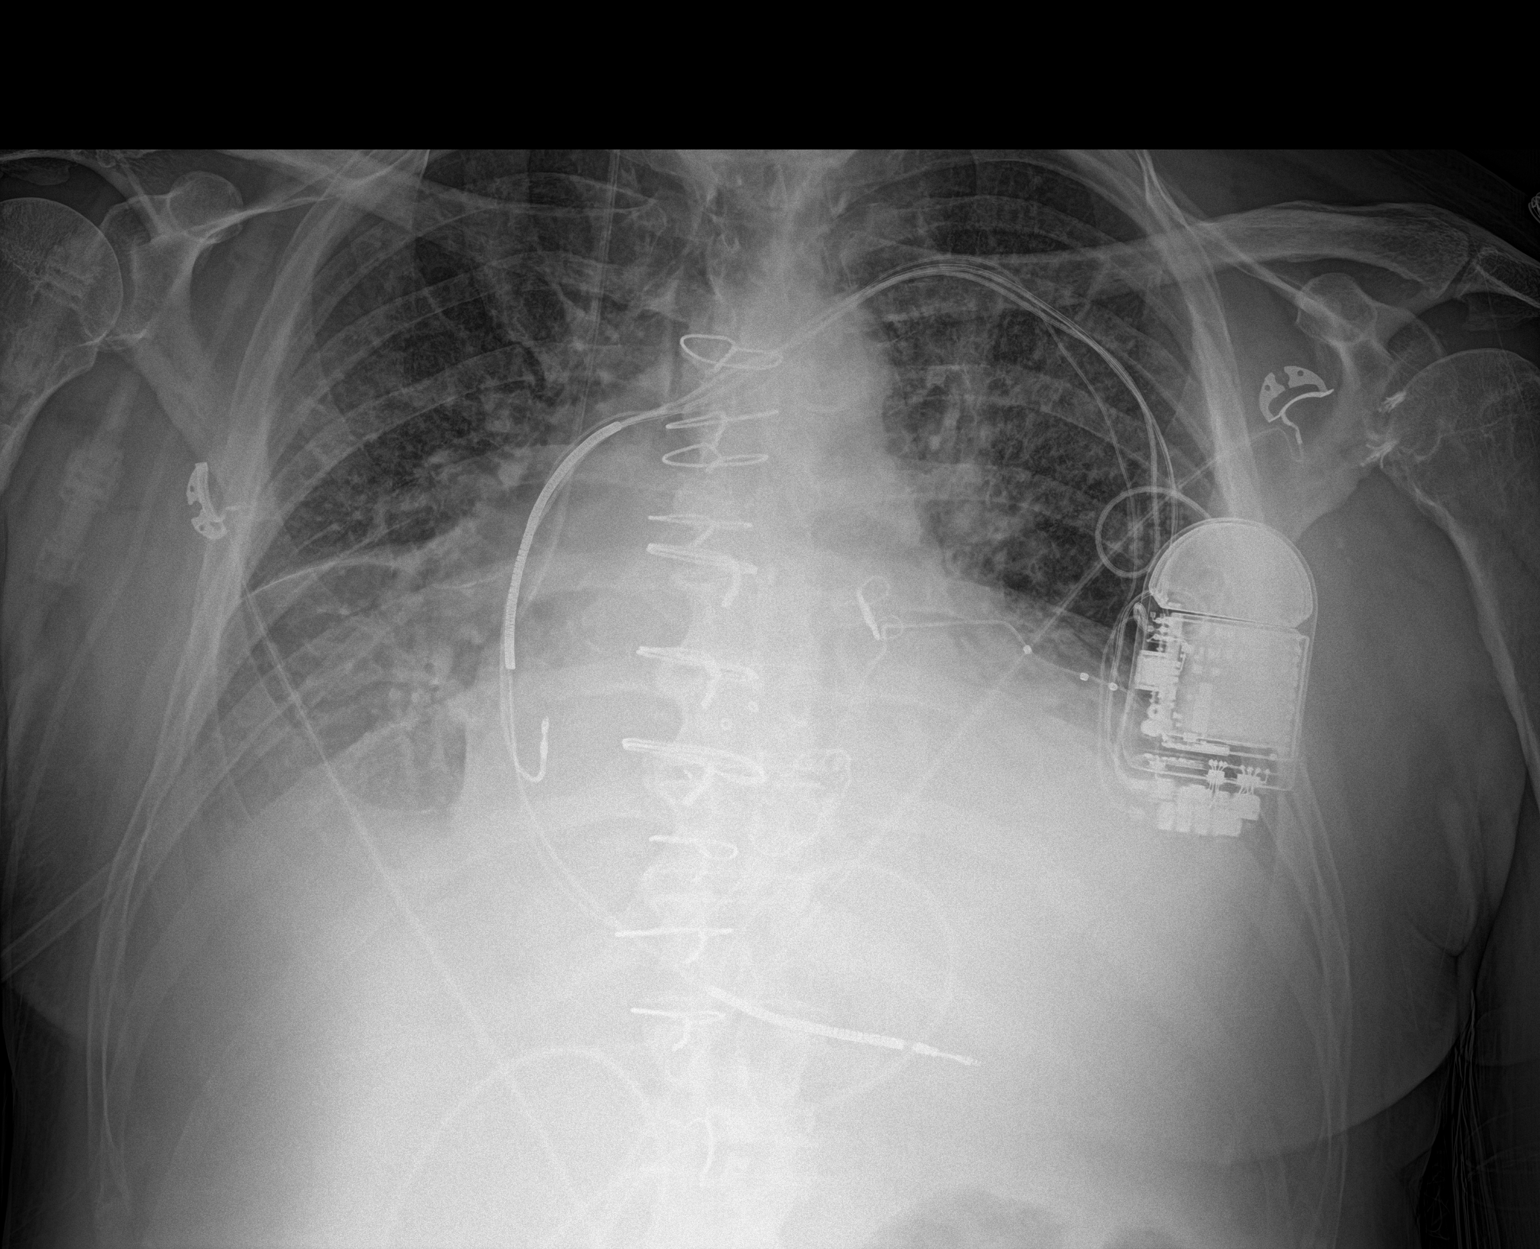

[1 of 1 positions shown; findings below may reference images not displayed]

FINDINGS: Left AICD remains in place, unchanged. Right central line is
unchanged. Cardiomegaly with bilateral airspace opacities and
layering effusions compatible with CHF. No change.
IMPRESSION: Moderate CHF and layering effusions.  No change since prior study.

## 2018-02-26 IMAGING — DX DG CHEST 1V PORT
1 series · 1 of 1 positions shown · non-contrast
Comparison: 05/19/2017

CLINICAL DATA: Check endotracheal tube placement

EXAM:
PORTABLE CHEST 1 VIEW

[chest ap]
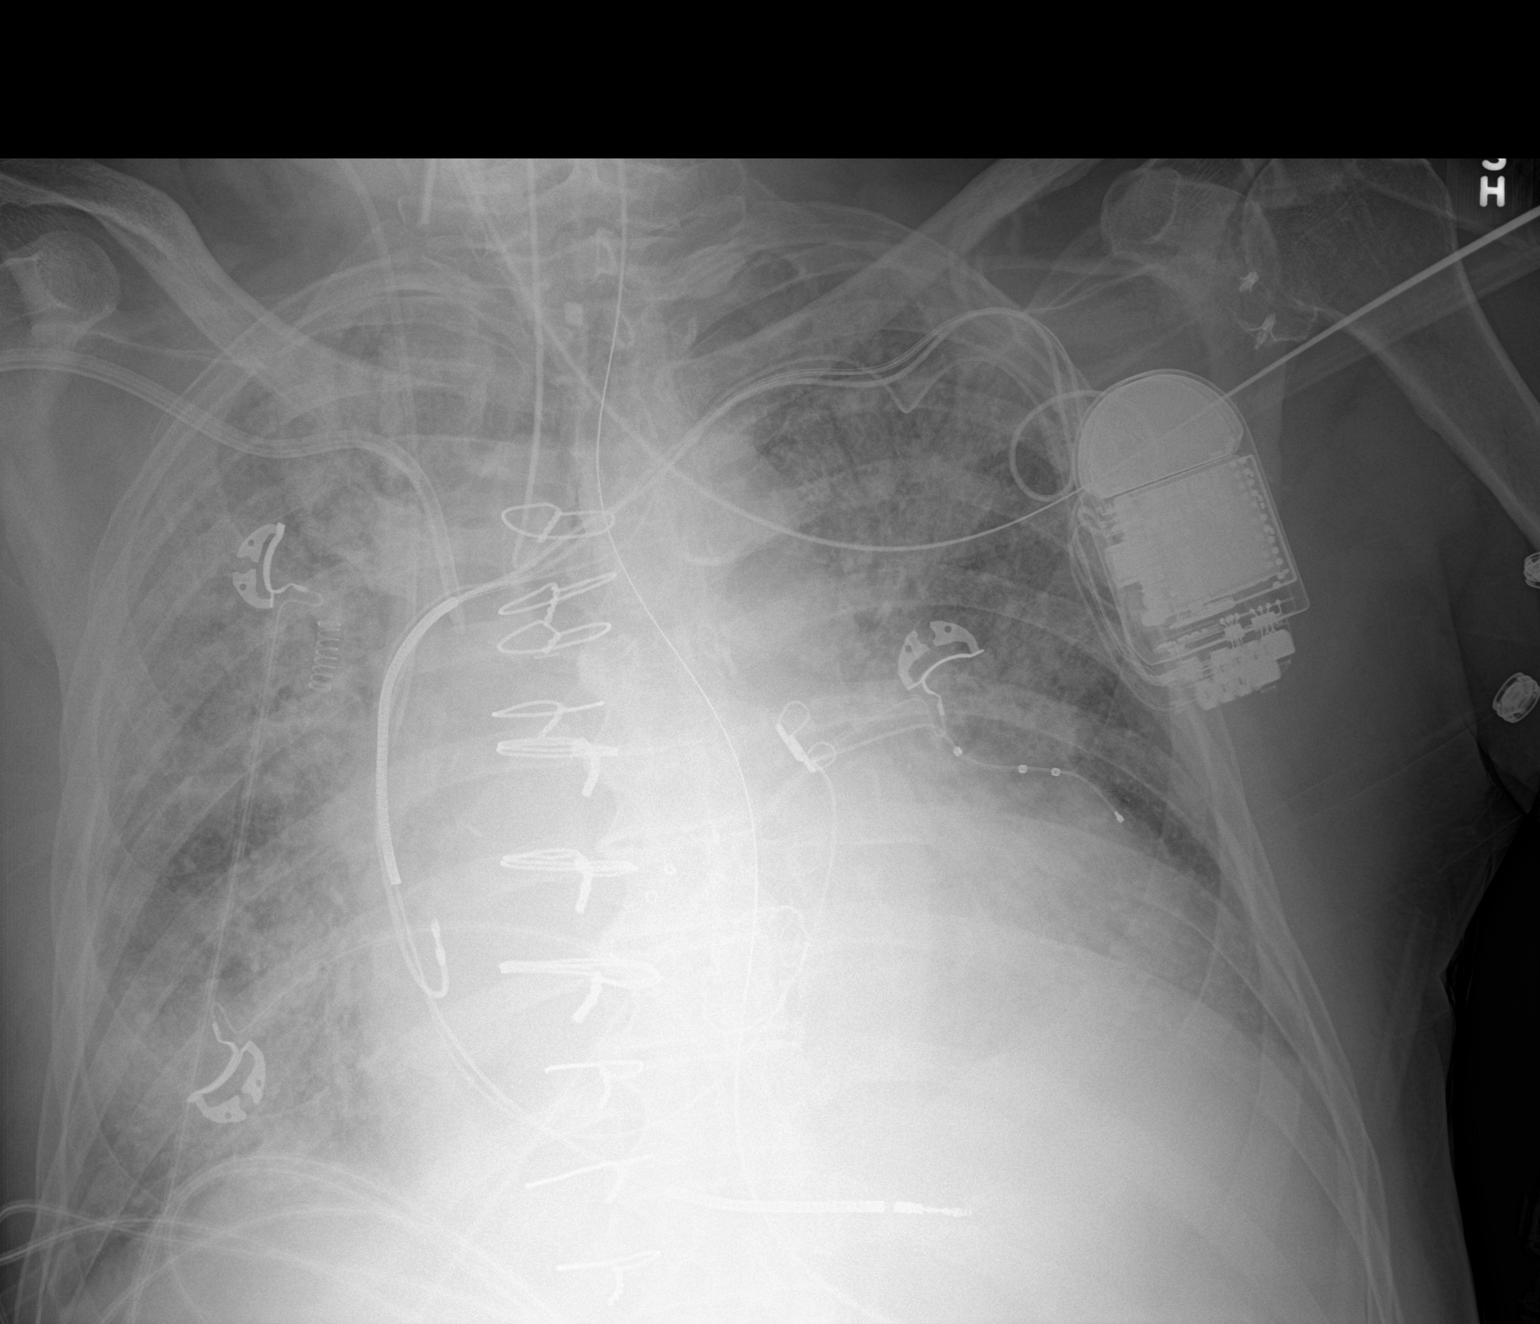

[1 of 1 positions shown; findings below may reference images not displayed]

FINDINGS: Cardiac shadow is enlarged. Defibrillator is again noted and stable.
Right jugular and right subclavian central lines are again noted and
stable. Endotracheal tube and nasogastric catheter are noted in
satisfactory position. Diffuse bilateral infiltrative changes are
noted consistent with pulmonary edema. This has increased somewhat
in the interval from the prior exam. Postsurgical changes are again
noted.
IMPRESSION: Increasing CHF and pulmonary edema.

Tubes and lines stable in appearance from the prior exam.
